# Patient Record
Sex: Female | Born: 1955
Health system: Southern US, Community
[De-identification: ages and names within clinical notes are randomized; demographics above are authoritative.]

## PROBLEM LIST (undated history)

## (undated) DIAGNOSIS — Z0000 Encounter for general adult medical examination without abnormal findings: Secondary | ICD-10-CM

## (undated) DIAGNOSIS — K219 Gastro-esophageal reflux disease without esophagitis: Secondary | ICD-10-CM

## (undated) DIAGNOSIS — G4700 Insomnia, unspecified: Principal | ICD-10-CM

## (undated) DIAGNOSIS — J309 Allergic rhinitis, unspecified: Secondary | ICD-10-CM

## (undated) DIAGNOSIS — M1990 Unspecified osteoarthritis, unspecified site: Secondary | ICD-10-CM

## (undated) DIAGNOSIS — R1013 Epigastric pain: Secondary | ICD-10-CM

## (undated) DIAGNOSIS — D649 Anemia, unspecified: Secondary | ICD-10-CM

## (undated) DIAGNOSIS — Z8632 Personal history of gestational diabetes: Secondary | ICD-10-CM

## (undated) DIAGNOSIS — S32000A Wedge compression fracture of unspecified lumbar vertebra, initial encounter for closed fracture: Secondary | ICD-10-CM

## (undated) DIAGNOSIS — F419 Anxiety disorder, unspecified: Secondary | ICD-10-CM

## (undated) DIAGNOSIS — Z5189 Encounter for other specified aftercare: Secondary | ICD-10-CM

## (undated) DIAGNOSIS — F329 Major depressive disorder, single episode, unspecified: Secondary | ICD-10-CM

## (undated) DIAGNOSIS — T7840XA Allergy, unspecified, initial encounter: Secondary | ICD-10-CM

## (undated) DIAGNOSIS — M810 Age-related osteoporosis without current pathological fracture: Secondary | ICD-10-CM

## (undated) DIAGNOSIS — Z780 Asymptomatic menopausal state: Secondary | ICD-10-CM

## (undated) DIAGNOSIS — M25519 Pain in unspecified shoulder: Secondary | ICD-10-CM

## (undated) DIAGNOSIS — C4491 Basal cell carcinoma of skin, unspecified: Secondary | ICD-10-CM

## (undated) DIAGNOSIS — C44711 Basal cell carcinoma of skin of unspecified lower limb, including hip: Secondary | ICD-10-CM

## (undated) DIAGNOSIS — D259 Leiomyoma of uterus, unspecified: Secondary | ICD-10-CM

## (undated) DIAGNOSIS — F32A Depression, unspecified: Secondary | ICD-10-CM

## (undated) DIAGNOSIS — S022XXA Fracture of nasal bones, initial encounter for closed fracture: Secondary | ICD-10-CM

## (undated) DIAGNOSIS — H04123 Dry eye syndrome of bilateral lacrimal glands: Secondary | ICD-10-CM

## (undated) HISTORY — DX: Disorder of phosphorus metabolism, unspecified: E83.30

## (undated) HISTORY — DX: Anxiety disorder, unspecified: F41.9

## (undated) HISTORY — DX: Fracture of nasal bones, initial encounter for closed fracture: S02.2XXA

## (undated) HISTORY — DX: Basal cell carcinoma of skin, unspecified: C44.91

## (undated) HISTORY — DX: Asymptomatic menopausal state: Z78.0

## (undated) HISTORY — DX: Pain in unspecified shoulder: M25.519

## (undated) HISTORY — PX: HERNIA REPAIR: SHX51

## (undated) HISTORY — PX: CHOLECYSTECTOMY: SHX55

## (undated) HISTORY — DX: Encounter for general adult medical examination without abnormal findings: Z00.00

## (undated) HISTORY — DX: Encounter for other specified aftercare: Z51.89

## (undated) HISTORY — DX: Anemia, unspecified: D64.9

## (undated) HISTORY — DX: Wedge compression fracture of unspecified lumbar vertebra, initial encounter for closed fracture: S32.000A

## (undated) HISTORY — PX: NASAL SINUS SURGERY: SHX719

## (undated) HISTORY — PX: GASTRIC BYPASS: SHX52

## (undated) HISTORY — DX: Gastro-esophageal reflux disease without esophagitis: K21.9

## (undated) HISTORY — PX: OTHER SURGICAL HISTORY: SHX169

## (undated) HISTORY — DX: Age-related osteoporosis without current pathological fracture: M81.0

## (undated) HISTORY — DX: Dry eye syndrome of bilateral lacrimal glands: H04.123

## (undated) HISTORY — DX: Basal cell carcinoma of skin of unspecified lower limb, including hip: C44.711

## (undated) HISTORY — DX: Depression, unspecified: F32.A

## (undated) HISTORY — DX: Major depressive disorder, single episode, unspecified: F32.9

## (undated) HISTORY — DX: Allergy, unspecified, initial encounter: T78.40XA

## (undated) HISTORY — PX: TUBAL LIGATION: SHX77

## (undated) HISTORY — DX: Epigastric pain: R10.13

## (undated) HISTORY — DX: Personal history of gestational diabetes: Z86.32

## (undated) HISTORY — PX: SEPTOPLASTY: SUR1290

## (undated) HISTORY — DX: Allergic rhinitis, unspecified: J30.9

## (undated) HISTORY — DX: Unspecified osteoarthritis, unspecified site: M19.90

## (undated) HISTORY — PX: COLONOSCOPY: SHX174

## (undated) HISTORY — PX: APPENDECTOMY: SHX54

## (undated) HISTORY — PX: KNEE SURGERY: SHX244

## (undated) HISTORY — DX: Insomnia, unspecified: G47.00

## (undated) HISTORY — DX: Leiomyoma of uterus, unspecified: D25.9

---

## 1999-12-15 ENCOUNTER — Other Ambulatory Visit: Admission: RE | Admit: 1999-12-15 | Discharge: 1999-12-15 | Payer: Self-pay | Admitting: Obstetrics and Gynecology

## 2000-11-12 ENCOUNTER — Encounter: Admission: RE | Admit: 2000-11-12 | Discharge: 2001-02-10 | Payer: Self-pay | Admitting: Surgical Oncology

## 2001-01-14 ENCOUNTER — Ambulatory Visit (HOSPITAL_BASED_OUTPATIENT_CLINIC_OR_DEPARTMENT_OTHER): Admission: RE | Admit: 2001-01-14 | Discharge: 2001-01-14 | Payer: Self-pay | Admitting: Internal Medicine

## 2001-02-03 ENCOUNTER — Other Ambulatory Visit: Admission: RE | Admit: 2001-02-03 | Discharge: 2001-02-03 | Payer: Self-pay | Admitting: Obstetrics and Gynecology

## 2002-02-05 ENCOUNTER — Other Ambulatory Visit: Admission: RE | Admit: 2002-02-05 | Discharge: 2002-02-05 | Payer: Self-pay | Admitting: Obstetrics and Gynecology

## 2003-04-07 ENCOUNTER — Other Ambulatory Visit: Admission: RE | Admit: 2003-04-07 | Discharge: 2003-04-07 | Payer: Self-pay | Admitting: Obstetrics and Gynecology

## 2003-12-14 ENCOUNTER — Ambulatory Visit: Payer: Self-pay | Admitting: Family Medicine

## 2004-01-28 ENCOUNTER — Ambulatory Visit: Payer: Self-pay | Admitting: Family Medicine

## 2004-06-09 ENCOUNTER — Other Ambulatory Visit: Admission: RE | Admit: 2004-06-09 | Discharge: 2004-06-09 | Payer: Self-pay | Admitting: Obstetrics and Gynecology

## 2004-08-14 ENCOUNTER — Ambulatory Visit: Payer: Self-pay | Admitting: Internal Medicine

## 2004-09-14 ENCOUNTER — Ambulatory Visit: Payer: Self-pay | Admitting: Internal Medicine

## 2004-10-12 ENCOUNTER — Ambulatory Visit: Payer: Self-pay | Admitting: Internal Medicine

## 2004-10-24 ENCOUNTER — Ambulatory Visit: Payer: Self-pay | Admitting: Family Medicine

## 2005-08-29 ENCOUNTER — Other Ambulatory Visit: Admission: RE | Admit: 2005-08-29 | Discharge: 2005-08-29 | Payer: Self-pay | Admitting: Obstetrics and Gynecology

## 2006-03-05 ENCOUNTER — Ambulatory Visit: Payer: Self-pay | Admitting: Internal Medicine

## 2006-04-12 ENCOUNTER — Ambulatory Visit: Payer: Self-pay | Admitting: Family Medicine

## 2006-05-14 ENCOUNTER — Ambulatory Visit: Payer: Self-pay | Admitting: Internal Medicine

## 2006-07-31 ENCOUNTER — Ambulatory Visit: Payer: Self-pay | Admitting: Internal Medicine

## 2006-07-31 DIAGNOSIS — J019 Acute sinusitis, unspecified: Secondary | ICD-10-CM | POA: Insufficient documentation

## 2006-08-12 ENCOUNTER — Telehealth: Payer: Self-pay | Admitting: Internal Medicine

## 2006-08-15 DIAGNOSIS — J309 Allergic rhinitis, unspecified: Secondary | ICD-10-CM | POA: Insufficient documentation

## 2006-08-15 HISTORY — DX: Allergic rhinitis, unspecified: J30.9

## 2006-08-22 ENCOUNTER — Ambulatory Visit: Payer: Self-pay | Admitting: Internal Medicine

## 2006-08-22 DIAGNOSIS — M169 Osteoarthritis of hip, unspecified: Secondary | ICD-10-CM

## 2006-08-22 DIAGNOSIS — M1610 Unilateral primary osteoarthritis, unspecified hip: Secondary | ICD-10-CM | POA: Insufficient documentation

## 2006-09-11 ENCOUNTER — Other Ambulatory Visit: Admission: RE | Admit: 2006-09-11 | Discharge: 2006-09-11 | Payer: Self-pay | Admitting: Obstetrics and Gynecology

## 2006-10-08 ENCOUNTER — Ambulatory Visit: Payer: Self-pay | Admitting: Internal Medicine

## 2006-10-08 LAB — CONVERTED CEMR LAB
ALT: 15 units/L (ref 0–35)
Blood in Urine, dipstick: NEGATIVE
CO2: 28 meq/L (ref 19–32)
Chloride: 108 meq/L (ref 96–112)
Cholesterol: 124 mg/dL (ref 0–200)
Eosinophils Absolute: 0.1 10*3/uL (ref 0.0–0.6)
GFR calc Af Amer: 98 mL/min
GFR calc non Af Amer: 81 mL/min
HDL: 50.7 mg/dL (ref >39.0)
Hemoglobin: 10.3 g/dL — ABNORMAL LOW (ref 12.0–15.0)
LDL Cholesterol: 49 mg/dL (ref 0–99)
MCHC: 32.2 g/dL (ref 30.0–36.0)
Monocytes Relative: 5.6 % (ref 3.0–11.0)
Neutrophils Relative %: 55 % (ref 43.0–77.0)
Platelets: 350 10*3/uL (ref 150–400)
Potassium: 4.9 meq/L (ref 3.5–5.1)
RBC: 4 M/uL (ref 3.87–5.11)
RDW: 15.2 % — ABNORMAL HIGH (ref 11.5–14.6)
Sodium: 142 meq/L (ref 135–145)
Specific Gravity, Urine: 1.02
TSH: 1.08 microintl units/mL (ref 0.35–5.50)
Triglycerides: 122 mg/dL (ref 0–149)
Urobilinogen, UA: 1
VLDL: 24 mg/dL (ref 0–40)
WBC Urine, dipstick: NEGATIVE

## 2006-10-24 ENCOUNTER — Ambulatory Visit: Payer: Self-pay | Admitting: Internal Medicine

## 2006-10-24 DIAGNOSIS — D649 Anemia, unspecified: Secondary | ICD-10-CM | POA: Insufficient documentation

## 2006-10-24 DIAGNOSIS — M1990 Unspecified osteoarthritis, unspecified site: Secondary | ICD-10-CM

## 2006-10-24 DIAGNOSIS — F3289 Other specified depressive episodes: Secondary | ICD-10-CM

## 2006-10-24 DIAGNOSIS — F329 Major depressive disorder, single episode, unspecified: Secondary | ICD-10-CM

## 2006-10-24 DIAGNOSIS — F418 Other specified anxiety disorders: Secondary | ICD-10-CM | POA: Insufficient documentation

## 2006-10-24 HISTORY — DX: Major depressive disorder, single episode, unspecified: F32.9

## 2006-10-24 HISTORY — DX: Anemia, unspecified: D64.9

## 2006-10-24 HISTORY — DX: Unspecified osteoarthritis, unspecified site: M19.90

## 2006-10-24 HISTORY — DX: Other specified depressive episodes: F32.89

## 2006-12-03 ENCOUNTER — Ambulatory Visit: Payer: Self-pay | Admitting: Internal Medicine

## 2006-12-03 DIAGNOSIS — D509 Iron deficiency anemia, unspecified: Secondary | ICD-10-CM | POA: Insufficient documentation

## 2006-12-03 LAB — CONVERTED CEMR LAB
Basophils Absolute: 0.1 10*3/uL (ref 0.0–0.1)
Basophils Relative: 0.6 % (ref 0.0–1.0)
Eosinophils Absolute: 0.2 10*3/uL (ref 0.0–0.6)
Eosinophils Relative: 1.8 % (ref 0.0–5.0)
Iron: 19 ug/dL — ABNORMAL LOW (ref 42–145)
MCHC: 32.2 g/dL (ref 30.0–36.0)
MCV: 79.7 fL (ref 78.0–100.0)
Monocytes Absolute: 0.5 10*3/uL (ref 0.2–0.7)
Neutrophils Relative %: 57.6 % (ref 43.0–77.0)
Platelets: 352 10*3/uL (ref 150–400)
RBC: 4.27 M/uL (ref 3.87–5.11)

## 2007-01-02 HISTORY — PX: ABDOMINAL HYSTERECTOMY: SHX81

## 2007-02-22 ENCOUNTER — Inpatient Hospital Stay (HOSPITAL_COMMUNITY): Admission: AD | Admit: 2007-02-22 | Discharge: 2007-02-23 | Payer: Self-pay | Admitting: Gynecology

## 2007-02-25 ENCOUNTER — Encounter: Payer: Self-pay | Admitting: Obstetrics and Gynecology

## 2007-02-25 ENCOUNTER — Ambulatory Visit (HOSPITAL_COMMUNITY): Admission: RE | Admit: 2007-02-25 | Discharge: 2007-02-26 | Payer: Self-pay | Admitting: Obstetrics and Gynecology

## 2007-04-09 ENCOUNTER — Telehealth: Payer: Self-pay | Admitting: Internal Medicine

## 2007-05-22 ENCOUNTER — Telehealth: Payer: Self-pay | Admitting: Internal Medicine

## 2007-05-30 ENCOUNTER — Ambulatory Visit: Payer: Self-pay | Admitting: Internal Medicine

## 2007-05-30 LAB — CONVERTED CEMR LAB
Basophils Relative: 1 % (ref 0–1)
Eosinophils Absolute: 0.2 10*3/uL (ref 0.0–0.7)
HCT: 34.8 % — ABNORMAL LOW (ref 36.0–46.0)
Hemoglobin: 10.7 g/dL — ABNORMAL LOW (ref 12.0–15.0)
Lymphs Abs: 3.4 10*3/uL (ref 0.7–4.0)
MCV: 81.3 fL (ref 78.0–100.0)
Monocytes Absolute: 0.6 10*3/uL (ref 0.1–1.0)
Neutrophils Relative %: 53 % (ref 43–77)
Platelets: 310 10*3/uL (ref 150–400)
RBC: 4.28 M/uL (ref 3.87–5.11)
WBC: 9 10*3/uL (ref 4.0–10.5)

## 2007-06-18 ENCOUNTER — Telehealth: Payer: Self-pay | Admitting: Internal Medicine

## 2007-06-19 ENCOUNTER — Telehealth: Payer: Self-pay | Admitting: *Deleted

## 2007-07-01 ENCOUNTER — Telehealth: Payer: Self-pay | Admitting: Internal Medicine

## 2007-09-29 ENCOUNTER — Ambulatory Visit: Payer: Self-pay | Admitting: Internal Medicine

## 2007-09-29 ENCOUNTER — Telehealth: Payer: Self-pay | Admitting: Internal Medicine

## 2007-09-29 DIAGNOSIS — IMO0001 Reserved for inherently not codable concepts without codable children: Secondary | ICD-10-CM | POA: Insufficient documentation

## 2007-09-29 DIAGNOSIS — J0110 Acute frontal sinusitis, unspecified: Secondary | ICD-10-CM | POA: Insufficient documentation

## 2007-09-29 LAB — CONVERTED CEMR LAB
Basophils Absolute: 0 10*3/uL (ref 0.0–0.1)
Eosinophils Relative: 1.3 % (ref 0.0–5.0)
HCT: 31.8 % — ABNORMAL LOW (ref 36.0–46.0)
MCHC: 33.1 g/dL (ref 30.0–36.0)
MCV: 84.8 fL (ref 78.0–100.0)
Monocytes Absolute: 0.5 10*3/uL (ref 0.1–1.0)
Neutrophils Relative %: 57.6 % (ref 43.0–77.0)

## 2007-10-10 ENCOUNTER — Ambulatory Visit: Payer: Self-pay | Admitting: Internal Medicine

## 2007-10-10 DIAGNOSIS — M25519 Pain in unspecified shoulder: Secondary | ICD-10-CM

## 2007-10-10 HISTORY — DX: Pain in unspecified shoulder: M25.519

## 2007-10-22 ENCOUNTER — Ambulatory Visit: Payer: Self-pay | Admitting: Internal Medicine

## 2007-10-22 ENCOUNTER — Telehealth: Payer: Self-pay | Admitting: Internal Medicine

## 2007-10-22 DIAGNOSIS — S022XXA Fracture of nasal bones, initial encounter for closed fracture: Secondary | ICD-10-CM

## 2007-10-22 HISTORY — DX: Fracture of nasal bones, initial encounter for closed fracture: S02.2XXA

## 2007-10-26 ENCOUNTER — Emergency Department (HOSPITAL_BASED_OUTPATIENT_CLINIC_OR_DEPARTMENT_OTHER): Admission: EM | Admit: 2007-10-26 | Discharge: 2007-10-26 | Payer: Self-pay | Admitting: Emergency Medicine

## 2008-02-18 ENCOUNTER — Telehealth: Payer: Self-pay | Admitting: Internal Medicine

## 2008-02-25 ENCOUNTER — Telehealth: Payer: Self-pay | Admitting: *Deleted

## 2008-06-14 ENCOUNTER — Ambulatory Visit: Payer: Self-pay | Admitting: Internal Medicine

## 2008-06-16 ENCOUNTER — Ambulatory Visit: Payer: Self-pay | Admitting: Internal Medicine

## 2008-06-16 DIAGNOSIS — M7710 Lateral epicondylitis, unspecified elbow: Secondary | ICD-10-CM | POA: Insufficient documentation

## 2008-07-06 ENCOUNTER — Telehealth (INDEPENDENT_AMBULATORY_CARE_PROVIDER_SITE_OTHER): Payer: Self-pay | Admitting: *Deleted

## 2008-10-08 ENCOUNTER — Telehealth (INDEPENDENT_AMBULATORY_CARE_PROVIDER_SITE_OTHER): Payer: Self-pay | Admitting: *Deleted

## 2008-12-07 ENCOUNTER — Telehealth: Payer: Self-pay | Admitting: Internal Medicine

## 2008-12-20 ENCOUNTER — Telehealth: Payer: Self-pay | Admitting: Internal Medicine

## 2009-06-13 ENCOUNTER — Ambulatory Visit: Payer: Self-pay | Admitting: Internal Medicine

## 2009-06-13 ENCOUNTER — Telehealth: Payer: Self-pay | Admitting: Internal Medicine

## 2009-12-09 ENCOUNTER — Telehealth: Payer: Self-pay | Admitting: Internal Medicine

## 2010-02-02 NOTE — Progress Notes (Signed)
Summary: sinus infection?  Phone Note Call from Patient   Caller: Patient Call For: Stacie Glaze MD Reason for Call: Acute Illness Summary of Call: Pt is c/o headache and sinus congestion and post nasal drainage.  no fever.  Taking Day quil. and askng for RX. [facility] [phone] Initial call taken by: [facility] CMA AAMA,  December 09, 2009 2:03 PM  Follow-up for Phone Call        per dr Lovell Sheehan- may have levaquin 500 two times a day for 10 days and lodraine 1 two times a day for 10 days( just need to refill( Follow-up by: Willy Eddy, LPN,  December 09, 2009 2:15 PM    New/Updated Medications: LEVAQUIN 500 MG TABS (LEVOFLOXACIN) one by mouth daily x 10 days Prescriptions: LEVAQUIN 500 MG TABS (LEVOFLOXACIN) one by mouth daily x 10 days  #10 x 0   Entered by:   Lynann Beaver CMA AAMA   Authorized by:   Stacie Glaze MD   Signed by:   Lynann Beaver CMA AAMA on 12/09/2009   Method used:   [id number]       [address] [place], [place]  [address]       Ph: [phone] or [phone]       Fax: [phone]   RxID:   [id number]  Notified pt.

## 2010-02-02 NOTE — Progress Notes (Signed)
Summary: sinus infection  Phone Note Call from Patient   Caller: Patient Call For: Stacie Glaze MD Summary of Call: Pt is asking for a prescription of an ongoing sinus infection she has had for weeks.  Sinus congestion, and headache. [facility] [phone] Initial call taken by: Lynann Beaver CMA,  June 13, 2009 9:40 AM  Follow-up for Phone Call        ov given Follow-up by: Willy Eddy, LPN,  June 13, 2009 11:52 AM

## 2010-02-02 NOTE — Assessment & Plan Note (Signed)
Summary: sinusitis/bmw   Vital Signs:  Patient profile:   55 year old female Height:      65 inches Temp:     98.2 degrees F oral Pulse rate:   72 / minute Resp:     14 per minute BP sitting:   130 / 80  (left arm)  Vitals Entered By: Willy Eddy, LPN (June 13, 2009 1:23 PM) CC: c/o sinusitis like sx for several weeks, URI symptoms   CC:  c/o sinusitis like sx for several weeks and URI symptoms.  History of Present Illness:  URI Symptoms      This is a 55 year old woman who presents with URI symptoms.  several weeks of conjestions thta waxed and wanes that she initial though was allergies.  The patient reports nasal congestion, sore throat, and earache, but denies clear nasal discharge, purulent nasal discharge, dry cough, productive cough, and sick contacts.  The patient denies fever, low-grade fever (<100.5 degrees), fever of 100.5-103 degrees, fever of 103.1-104 degrees, fever to >104 degrees, stiff neck, dyspnea, wheezing, rash, vomiting, diarrhea, use of an antipyretic, and response to antipyretic.  The patient also reports itchy watery eyes, itchy throat, headache, muscle aches, and severe fatigue.  The patient denies the following risk factors for Strep sinusitis: unilateral facial pain, unilateral nasal discharge, poor response to decongestant, double sickening, tooth pain, Strep exposure, tender adenopathy, and absence of cough.    Preventive Screening-Counseling & Management  Alcohol-Tobacco     Smoking Status: never     Passive Smoke Exposure: no  Problems Prior to Update: 1)  Lateral Epicondylitis  (ICD-726.32) 2)  Fracture, Nose  (ICD-802.0) 3)  Shoulder Pain, Bilateral  (ICD-719.41) 4)  Myofascial Pain Syndrome  (ICD-729.1) 5)  Acute Frontal Sinusitis  (ICD-461.1) 6)  Anemia-iron Deficiency  (ICD-280.9) 7)  Preventive Health Care  (ICD-V70.0) 8)  Family History of Cad Female 1st Degree Relative <50  (ICD-V17.3) 9)  Osteoarthritis  (ICD-715.90) 10)  Depression   (ICD-311) 11)  Anemia-nos  (ICD-285.9) 12)  Osteoarthrosis, Local Nos, Pelvis/thigh  (ICD-715.35) 13)  Allergic Rhinitis  (ICD-477.9) 14)  Sinusitis  (ICD-473.9)  Current Problems (verified): 1)  Lateral Epicondylitis  (ICD-726.32) 2)  Fracture, Nose  (ICD-802.0) 3)  Shoulder Pain, Bilateral  (ICD-719.41) 4)  Myofascial Pain Syndrome  (ICD-729.1) 5)  Acute Frontal Sinusitis  (ICD-461.1) 6)  Anemia-iron Deficiency  (ICD-280.9) 7)  Preventive Health Care  (ICD-V70.0) 8)  Family History of Cad Female 1st Degree Relative <50  (ICD-V17.3) 9)  Osteoarthritis  (ICD-715.90) 10)  Depression  (ICD-311) 11)  Anemia-nos  (ICD-285.9) 12)  Osteoarthrosis, Local Nos, Pelvis/thigh  (ICD-715.35) 13)  Allergic Rhinitis  (ICD-477.9) 14)  Sinusitis  (ICD-473.9)  Medications Prior to Update: 1)  Zoloft 100 Mg  Tabs (Sertraline Hcl) .... One By Mouth Daily 2)  Vitamin B-12 Cr 1000 Mcg  Tbcr (Cyanocobalamin) .... One Q Day 3)  Ambien 10 Mg  Tabs (Zolpidem Tartrate) .... Q Hs As Needed 4)  Vitamin D 2500 .Marland Kitchen.. 1 Once Daily' 5)  Claritin-D 24 Hour 10-240 Mg Xr24h-Tab (Loratadine-Pseudoephedrine) .Marland Kitchen.. 1 Once Daily 6)  Ceftin 250 Mg Tabs (Cefuroxime Axetil) .... One By Mouth Two Times A Day 7)  Nasonex 50 Mcg/act Susp (Mometasone Furoate) .... Two Sprays Q Nare Daily 8)  Zithromax Z-Pak 250 Mg Tabs (Azithromycin) .... Use As Directed 9)  Robitussin Cough/cold Cf 5-10-100 Mg/53ml Liqd (Phenylephrine-Dm-Gg) .... As Directed  Current Medications (verified): 1)  Zoloft 100 Mg  Tabs (Sertraline Hcl) .Marland KitchenMarland KitchenMarland Kitchen  One By Mouth Daily 2)  Vitamin B-12 Cr 1000 Mcg  Tbcr (Cyanocobalamin) .... One Q Day 3)  Ambien 10 Mg  Tabs (Zolpidem Tartrate) .... Q Hs As Needed 4)  Vitamin D 2500 .Marland Kitchen.. 1 Once Daily' 5)  Claritin-D 24 Hour 10-240 Mg Xr24h-Tab (Loratadine-Pseudoephedrine) .Marland Kitchen.. 1 Once Daily  Allergies (verified): 1)  ! Sporanox (Itraconazole)  Past History:  Family History: Last updated: 12/03/2006 mother  healthy father  Family History of CAD Female 1st degree relative <50 Family History of Arthritis Family History Hypertension  Social History: Last updated: 12/03/2006 Married Never Smoked  Risk Factors: Caffeine Use: 1 (12/03/2006) Exercise: yes (10/24/2006)  Risk Factors: Smoking Status: never (06/13/2009) Passive Smoke Exposure: no (06/13/2009)  Past medical, surgical, family and social histories (including risk factors) reviewed for relevance to current acute and chronic problems.  Past Medical History: Reviewed history from 12/03/2006 and no changes required. Anemia-iron deficiency Allergic rhinitis Depression  Past Surgical History: Reviewed history from 10/24/2006 and no changes required. hx of septoplasty-pt's report Gastric bypass Tubal ligation Sinus surgery Caesarean section x 3 Appendectomy Cholecystectomy ventral herniorrhaphy  Family History: Reviewed history from 12/03/2006 and no changes required. mother healthy father  Family History of CAD Female 1st degree relative <50 Family History of Arthritis Family History Hypertension  Social History: Reviewed history from 12/03/2006 and no changes required. Married Never Smoked  Review of Systems       The patient complains of hoarseness, dyspnea on exertion, and peripheral edema.  The patient denies anorexia, fever, weight loss, weight gain, vision loss, decreased hearing, chest pain, syncope, prolonged cough, headaches, hemoptysis, abdominal pain, melena, hematochezia, hematuria, incontinence, genital sores, muscle weakness, suspicious skin lesions, transient blindness, difficulty walking, depression, unusual weight change, abnormal bleeding, enlarged lymph nodes, angioedema, and breast masses.    Physical Exam  General:  alert and well-developed.   Head:  Normocephalic and atraumatic without obvious abnormalities. No apparent alopecia or balding. Ears:  R ear normal and L ear normal.   Nose:  no  external deformity, mucosal erythema, mucosal edema, and airflow obstruction.   Mouth:  posterior lymphoid hypertrophy and postnasal drip.   Neck:  No deformities, masses, or tenderness noted. Lungs:  Normal respiratory effort, chest expands symmetrically. Lungs are clear to auscultation, no crackles or wheezes. Heart:  Normal rate and regular rhythm. S1 and S2 normal without gallop, murmur, click, rub or other extra sounds. Abdomen:  soft, normal bowel sounds, and no distention.     Impression & Recommendations:  Problem # 1:  ACUTE FRONTAL SINUSITIS (ICD-461.1) acute symptoms last infection was dec 2010 The following medications were removed from the medication list:    Ceftin 250 Mg Tabs (Cefuroxime axetil) ..... One by mouth two times a day    Nasonex 50 Mcg/act Susp (Mometasone furoate) .Marland Kitchen..Marland Kitchen Two sprays q nare daily    Zithromax Z-pak 250 Mg Tabs (Azithromycin) ..... Use as directed    Robitussin Cough/cold Cf 5-10-100 Mg/38ml Liqd (Phenylephrine-dm-gg) .Marland Kitchen... As directed Her updated medication list for this problem includes:    Claritin-d 24 Hour 10-240 Mg Xr24h-tab (Loratadine-pseudoephedrine) .Marland Kitchen... 1 once daily    Levaquin 500 Mg Tabs (Levofloxacin) ..... One by mouth two times a day for 10 days    Lodrane 12d 6-45 Mg Xr12h-tab (Brompheniramine-pseudoeph) ..... One by mouth two times a day  Instructed on treatment. Call if symptoms persist or worsen.   Problem # 2:  SINUSITIS (ICD-473.9)  chronic allergic The following medications were removed from the medication  list:    Ceftin 250 Mg Tabs (Cefuroxime axetil) ..... One by mouth two times a day    Nasonex 50 Mcg/act Susp (Mometasone furoate) .Marland Kitchen..Marland Kitchen Two sprays q nare daily    Zithromax Z-pak 250 Mg Tabs (Azithromycin) ..... Use as directed    Robitussin Cough/cold Cf 5-10-100 Mg/60ml Liqd (Phenylephrine-dm-gg) .Marland Kitchen... As directed Her updated medication list for this problem includes:    Claritin-d 24 Hour 10-240 Mg Xr24h-tab  (Loratadine-pseudoephedrine) .Marland Kitchen... 1 once daily    Levaquin 500 Mg Tabs (Levofloxacin) ..... One by mouth two times a day for 10 days    Lodrane 12d 6-45 Mg Xr12h-tab (Brompheniramine-pseudoeph) ..... One by mouth two times a day  Take antibiotics for full duration. Discussed treatment options including indications for coronal CT scan of sinuses and ENT referral.   Complete Medication List: 1)  Zoloft 100 Mg Tabs (Sertraline hcl) .... One by mouth daily 2)  Vitamin B-12 Cr 1000 Mcg Tbcr (Cyanocobalamin) .... One q day 3)  Ambien 10 Mg Tabs (Zolpidem tartrate) .... Q hs as needed 4)  Vitamin D 2500  .Marland Kitchen.. 1 once daily' 5)  Claritin-d 24 Hour 10-240 Mg Xr24h-tab (Loratadine-pseudoephedrine) .Marland Kitchen.. 1 once daily 6)  Levaquin 500 Mg Tabs (Levofloxacin) .... One by mouth two times a day for 10 days 7)  Lodrane 12d 6-45 Mg Xr12h-tab (Brompheniramine-pseudoeph) .... One by mouth two times a day  Patient Instructions: 1)  Take your antibiotic as prescribed until ALL of it is gone, but stop if you develop a rash or swelling and contact our office as soon as possible. Prescriptions: LODRANE 12D 6-45 MG XR12H-TAB (BROMPHENIRAMINE-PSEUDOEPH) one by mouth two times a day  #20 x 1   Entered and Authorized by:   Stacie Glaze MD   Signed by:   Stacie Glaze MD on 06/13/2009   Method used:   [facility]        [facility]  [address] [phone][facility]       [address]       [place], [place]  [address]       Ph: [phone] or [phone]       Fax: [phone]   RxID:   [id number] LEVAQUIN 500 MG TABS (LEVOFLOXACIN) one by mouth two times a day for 10 days  #20 x 0   Entered and Authorized by:   Stacie Glaze MD   Signed by:   Stacie Glaze MD on 06/13/2009   Method used:   [facility]        [facility]  [address] [phone][facility] ([facility]ail)       [address] [place], [place]  [address]       Ph: [phone] or [phone]       Fax: [phone]   RxID:    [id number]   Appended Document: sinusitis/bmw levaquin changed to once daily

## 2010-02-14 ENCOUNTER — Telehealth: Payer: Self-pay | Admitting: Family Medicine

## 2010-02-15 ENCOUNTER — Encounter: Payer: Self-pay | Admitting: Family Medicine

## 2010-02-15 ENCOUNTER — Encounter (INDEPENDENT_AMBULATORY_CARE_PROVIDER_SITE_OTHER): Payer: Self-pay | Admitting: *Deleted

## 2010-02-15 ENCOUNTER — Ambulatory Visit (INDEPENDENT_AMBULATORY_CARE_PROVIDER_SITE_OTHER): Payer: Self-pay | Admitting: Family Medicine

## 2010-02-15 DIAGNOSIS — D509 Iron deficiency anemia, unspecified: Secondary | ICD-10-CM

## 2010-02-15 DIAGNOSIS — C44711 Basal cell carcinoma of skin of unspecified lower limb, including hip: Secondary | ICD-10-CM | POA: Insufficient documentation

## 2010-02-15 DIAGNOSIS — D259 Leiomyoma of uterus, unspecified: Secondary | ICD-10-CM

## 2010-02-15 DIAGNOSIS — F3289 Other specified depressive episodes: Secondary | ICD-10-CM

## 2010-02-15 DIAGNOSIS — F329 Major depressive disorder, single episode, unspecified: Secondary | ICD-10-CM

## 2010-02-15 DIAGNOSIS — J0110 Acute frontal sinusitis, unspecified: Secondary | ICD-10-CM

## 2010-02-15 HISTORY — DX: Basal cell carcinoma of skin of unspecified lower limb, including hip: C44.711

## 2010-02-15 HISTORY — DX: Leiomyoma of uterus, unspecified: D25.9

## 2010-02-22 ENCOUNTER — Encounter: Payer: Self-pay | Admitting: Family Medicine

## 2010-02-22 NOTE — Letter (Signed)
Summary: Pre Visit Letter Revised  [facility]  [address] [place], [place] [address]   Phone: [phone]  Fax: [phone]        02/15/2010 MRN: [id number] [facility] [address] [place], [place]  [address]  [place]             Procedure Date:  03/22/2010 @ 1:30   direct colon-Dt. Jarold Motto   Welcome to the Gastroenterology Division at [facility].    You are scheduled to see a nurse for your pre-procedure visit on 03/08/2010 at 10:00 on the 3rd floor at [facility], [address]  We ask that you try to arrive at our office 15 minutes prior to your appointment time to allow for check-in.  Please take a minute to review the attached form.  If you answer "Yes" to one or more of the questions on the first page, we ask that you call the person listed at your earliest opportunity.  If you answer "No" to all of the questions, please complete the rest of the form and bring it to your appointment.    Your nurse visit will consist of discussing your medical and surgical history, your immediate family medical history, and your medications.   If you are unable to list all of your medications on the form, please bring the medication bottles to your appointment and we will list them.  We will need to be aware of both prescribed and over the counter drugs.  We will need to know exact dosage information as well.    Please be prepared to read and sign documents such as consent forms, a financial agreement, and acknowledgement forms.  If necessary, and with your consent, a friend or relative is welcome to sit-in on the nurse visit with you.  Please bring your insurance card so that we may make a copy of it.  If your insurance requires a referral to see a specialist, please bring your referral form from your primary care physician.  No co-pay is required for this nurse visit.     If you cannot keep your appointment, please call [phone] to cancel or  reschedule prior to your appointment date.  This allows [place] the opportunity to schedule an appointment for another patient in need of care.    Thank you for choosing [facility] for your medical needs.  We appreciate the opportunity to care for you.  Please visit [place] at our website  to learn more about our practice.  Sincerely, The Gastroenterology Division

## 2010-02-22 NOTE — Assessment & Plan Note (Signed)
Summary: sinus infection and to establish with Dr. Abner Greenspan   Vital Signs:  Patient profile:   55 year old female Height:      65 inches (165.10 cm) Weight:      148.75 pounds (67.61 kg) BMI:     24.84 O2 Sat:      97 % on Room air Temp:     98.1 degrees F (36.72 degrees C) oral Pulse rate:   89 / minute BP sitting:   123 / 86  (right arm)  Vitals Entered By: Josph Macho RMA (February 15, 2010 9:22 AM)  O2 Flow:  Room air CC: Establish new pt/ possible Sinus infection - head congestion eyes, ears, throat- when blowing nose its green/bloody X 1 weekCF Is Patient Diabetic? No   History of Present Illness: Patient is a 55 year old Caucasian female who is here today for new patient appt. She is seen for acute onset of worsening nasal congestion, fevers, chills, greenrhinorrhea, facial pressure. Monitor he is occasionally blood-tinged. She's had some general malaise mild anorexia possible low-grade fevers. She denies any chest pain, palpitations, shortness of breath, ear pain, sore throat. She has not had her mammogram last year proceed with colonoscopy or bone densitometry since turning 51 or done any preventative health maintenance such as lab work her Pap smear is due to a very stressful one to 2 years. 2011 calculus her father after a long battle with hypertension and bradycardia kidney failure her mother said surgeries, other family members have been ill and she has 3 teenage daughters. As a result she has put her own health on hold she did undergo a hysterectomy in 2009 for painful, heavy menstrual bleeding due to uterine fibroids.   Preventive Screening-Counseling & Management  Caffeine-Diet-Exercise     Does Patient Exercise: no      Drug Use:  no.    Current Problems (verified): 1)  Basal Cell Carcinoma Skin Lower Limb Incl Hip  (ICD-173.71) 2)  Leiomyoma of Uterus, Unspecified  (ICD-218.9) 3)  Lateral Epicondylitis  (ICD-726.32) 4)  Fracture, Nose  (ICD-802.0) 5)  Shoulder  Pain, Bilateral  (ICD-719.41) 6)  Myofascial Pain Syndrome  (ICD-729.1) 7)  Acute Frontal Sinusitis  (ICD-461.1) 8)  Anemia-iron Deficiency  (ICD-280.9) 9)  Preventive Health Care  (ICD-V70.0) 10)  Family History of Cad Female 1st Degree Relative <50  (ICD-V17.3) 11)  Osteoarthritis  (ICD-715.90) 12)  Depression  (ICD-311) 13)  Anemia-nos  (ICD-285.9) 14)  Osteoarthrosis, Local Nos, Pelvis/thigh  (ICD-715.35) 15)  Allergic Rhinitis  (ICD-477.9) 16)  Sinusitis  (ICD-473.9)  Current Medications (verified): 1)  Zoloft 100 Mg  Tabs (Sertraline Hcl) .... One By Mouth Daily 2)  Vitamin B-12 Cr 1000 Mcg  Tbcr (Cyanocobalamin) .... One Q Day 3)  Ambien 10 Mg  Tabs (Zolpidem Tartrate) .... Q Hs As Needed 4)  Vitamin D 2500 .Marland Kitchen.. 1 Once Daily' 5)  Claritin-D 24 Hour 10-240 Mg Xr24h-Tab (Loratadine-Pseudoephedrine) .Marland Kitchen.. 1 Once Daily  Allergies (verified): 1)  ! Sporanox (Itraconazole)  Past History:  Past Surgical History: hx of septoplasty-pt's report Gastric bypass Tubal ligation Sinus surgery Caesarean section x 3 Appendectomy Cholecystectomy ventral herniorrhaphy x 2 Hysterectomy, partial, ovaries left in place 2009, for painful, heavy menstrual bleeding secondary to anemia and fibroids skin biopsy, L hip BCC, facial lesions were benign Lumbar compression fracture, nonsurgical  Family History: Father: deceased@76 , renal failure, HTN, CAD s/p bypass, Aortic Aneurysm, h/o smoker, hyperlipidemia Mother: 41, cholelithiasis, urinary incontince, bladder prolapse, laryngeal carcinoma, h/o smoking, allergies Siblings:  Sister: Galen Daft, 93,  Sister: 67, Amy Brother: Rosanne Ashing, Spina Bifida with shunt, 97, seizure disorder, self cath MGM: deceased in 65s, colon cancer, smoker MGF: deceased in 28s, lung cancer, previous cancer PGM: deceased in 60s, breast cancer PGF: deceased in late 21s, heart disease, stroke Children: Daughter: 16, ADHD Daughter: 52, ADHD Daughter: 65, [employer]  Social  History: Married Never Smoked Occupation: Arts development officer, previously Patent examiner Drug use-no Regular exercise-no wears a seat belt No dietary restrictions Alcohol use-no Occupation:  employed Does Patient Exercise:  no  Review of Systems       The patient complains of suspicious skin lesions.  The patient denies anorexia, fever, weight loss, weight gain, vision loss, decreased hearing, hoarseness, chest pain, syncope, dyspnea on exertion, peripheral edema, prolonged cough, headaches, hemoptysis, abdominal pain, melena, hematochezia, severe indigestion/heartburn, hematuria, incontinence, genital sores, muscle weakness, transient blindness, difficulty walking, depression, unusual weight change, abnormal bleeding, enlarged lymph nodes, angioedema, breast masses, and testicular masses.         Had her last pap in 2010, which was normal, no h/o abnormals. Last MGM in 2010 which was normal no history of abnormals  Physical Exam  General:  Well-developed,well-nourished,in no acute distress; alert,appropriate and cooperative throughout examination Head:  Normocephalic and atraumatic without obvious abnormalities. No apparent alopecia or balding. Eyes:  No corneal or conjunctival inflammation noted. EOMI. Perrla. Ears:  External ear exam shows no significant lesions or deformities.  Otoscopic examination reveals clear canals, tympanic membranes are intact bilaterally without bulging, retraction, inflammation or discharge. Hearing is grossly normal bilaterally. Nose:  External nasal examination shows no deformity or inflammation.mucosal erythema and mucosal edema.   Mouth:  Oral mucosa and oropharynx without lesions or exudates.  Teeth in good repair. Neck:  No deformities, masses, or tenderness noted. Lungs:  Normal respiratory effort, chest expands symmetrically. Lungs are clear to auscultation, no crackles or wheezes. Heart:  Normal rate and regular rhythm. S1 and S2 normal without gallop,  murmur, click, rub or other extra sounds. Abdomen:  Bowel sounds positive,abdomen soft and non-tender without masses, organomegaly or hernias noted. Msk:  No deformity or scoliosis noted of thoracic or lumbar spine.   Pulses:  R and L carotid,dorsalis pedis and posterior tibial pulses are full and equal bilaterally Extremities:  No clubbing, cyanosis, edema, or deformity noted with normal full range of motion of all joints.   Neurologic:  No cranial nerve deficits noted. Station and gait are normal. Plantar reflexes are down-going bilaterally. DTRs are symmetrical throughout. Sensory, motor and coordinative functions appear intact. Skin:  1x2cm raised, scaly lesion with an erythematous base and small spot of scabing centrally Cervical Nodes:  No lymphadenopathy noted Psych:  Cognition and judgment appear intact. Alert and cooperative with normal attention span and concentration. No apparent delusions, illusions, hallucinations   Impression & Recommendations:  Problem # 1:  BASAL CELL CARCINOMA SKIN LOWER LIMB INCL HIP (ICD-173.71) No recent concerning lesions but given her history she has been encouraged to set up annual screening exams with her Dermatologist, she has not been seen for about 2 years  Problem # 2:  ACUTE FRONTAL SINUSITIS (ICD-461.1)  The following medications were removed from the medication list:    Lodrane 12d 6-45 Mg Xr12h-tab (Brompheniramine-pseudoeph) ..... One by mouth two times a day    Levaquin 500 Mg Tabs (Levofloxacin) ..... One by mouth daily x 10 days Her updated medication list for this problem includes:    Claritin-d 24 Hour 10-240 Mg Xr24h-tab (Loratadine-pseudoephedrine) .Marland KitchenMarland KitchenMarland KitchenMarland Kitchen  1 once daily    Levaquin 500 Mg Tabs (Levofloxacin) .Marland Kitchen... 1 tab by mouth daily x 14 d    Fluticasone Propionate 50 Mcg/act Susp (Fluticasone propionate) .Marland Kitchen... 2 sprays each nostril daily as needed allergies Recurrent with some intermittent allergies will treat and she will call if  symptoms do not improve.  Problem # 3:  ANEMIA-IRON DEFICIENCY (ICD-280.9)  Her updated medication list for this problem includes:    Vitamin B-12 Cr 1000 Mcg Tbcr (Cyanocobalamin) ..... One q day Will check a CBC with next visit, likely improved s/p her hysterectomy and no longer struggling with mense  Problem # 4:  DEPRESSION (ICD-311)  Her updated medication list for this problem includes:    Zoloft 100 Mg Tabs (Sertraline hcl) .Marland Kitchen... 1/2 tab by mouth once daily x 7 days then stop Given samples of vibryd will take 10mg  by mouth once daily x 7 d then increase to 20mg  by mouth once daily x 7 days and then increase to 40mg  by mouth once daily, call if any concerning symptoms or concerns. Reevaluate at next months visit or as needed  Complete Medication List: 1)  Zoloft 100 Mg Tabs (Sertraline hcl) .... 1/2 tab by mouth once daily x 7 days then stop 2)  Vitamin B-12 Cr 1000 Mcg Tbcr (Cyanocobalamin) .... One q day 3)  Ambien 10 Mg Tabs (Zolpidem tartrate) .... Q hs as needed 4)  Vitamin D 2500  .Marland Kitchen.. 1 once daily' 5)  Claritin-d 24 Hour 10-240 Mg Xr24h-tab (Loratadine-pseudoephedrine) .Marland Kitchen.. 1 once daily 6)  Vibryd 40mg  Tab  .Marland Kitchen.. 1 tab by mouth daily 7)  Levaquin 500 Mg Tabs (Levofloxacin) .Marland Kitchen.. 1 tab by mouth daily x 14 d 8)  Fluticasone Propionate 50 Mcg/act Susp (Fluticasone propionate) .... 2 sprays each nostril daily as needed allergies  Other Orders: Gastroenterology Referral (GI)  Patient Instructions: 1)  Please schedule a follow-up appointment in 1 month or sooner as needed 2)  Start a fish oil or flaxseed oil cap daily 3)  Call and schedule your MGM 4)  We will set up your colonoscopy and you will get a phone call 5)  Call if any concerns Prescriptions: FLUTICASONE PROPIONATE 50 MCG/ACT SUSP (FLUTICASONE PROPIONATE) 2 sprays each nostril daily as needed allergies  #1 x 5   Entered and Authorized by:   Danise Edge MD   Signed by:   Danise Edge MD on 02/15/2010   Method used:    [facility] to        [facility] [facility] [phone]* (retail)       [address] [place], [place]  [address]       Ph: [phone] or [phone]       Fax: [phone]   RxID:   [id number] LEVAQUIN 500 MG TABS (LEVOFLOXACIN) 1 tab by mouth daily x 14 d  #14 x 0   Entered and Authorized by:   Danise Edge MD   Signed by:   Danise Edge MD on 02/15/2010   Method used:   [facility]ronically to        [facility]  [address] [phone]* (retail)       [address] [place], [place]  [address]       Ph: [phone] or [phone]       Fax: [phone]   RxID:   [id number]  VIBRYD 40MG  TAB 1 tab by mouth daily  #30 x 3   Entered and Authorized by:   Danise Edge MD   Signed by:   Danise Edge MD on 02/15/2010   Method used:   Faxed to ...       [facility]  [address] [phone][facility] (retail)       [address]       [place], [place]  [address]       Ph: [phone] or [phone]       Fax: [phone]   RxID:   [id number]    Orders Added: 1)  Gastroenterology Referral [GI] 2)  Est. Patient Level IV [[address]]

## 2010-02-22 NOTE — Progress Notes (Signed)
Summary: Change PCP  Phone Note Call from Patient Call back at [phone]   Caller: Patient Summary of Call: Patient wants to be seen and also wants to change doctors due to location.  Initial call taken by: Georga Bora,  February 14, 2010 2:59 PM  Follow-up for Phone Call        may change mds  good pt Follow-up by: Stacie Glaze MD,  February 14, 2010 3:05 PM  Additional Follow-up for Phone Call Additional follow up Details #1::        Ok with me, thanks Additional Follow-up by: Danise Edge MD,  February 14, 2010 4:18 PM    Additional Follow-up for Phone Call Additional follow up Details #2::    Called patient to schedule appt. Follow-up by: Georga Bora,  February 14, 2010 4:54 PM

## 2010-03-06 ENCOUNTER — Encounter (INDEPENDENT_AMBULATORY_CARE_PROVIDER_SITE_OTHER): Payer: Self-pay | Admitting: *Deleted

## 2010-03-08 ENCOUNTER — Encounter: Payer: Self-pay | Admitting: Gastroenterology

## 2010-03-14 NOTE — Letter (Signed)
Summary: Moviprep Instructions  [facility]  [address]   [place], [place] [address]   Phone: [phone]  Fax: [phone]       Jennifer Sandoval    11/01/1955    MRN: [id number]        Procedure Day /Date:  Wednesday, 03-22-10     Arrival Time: 12:30 p.m.     Procedure Time: 1:30 p.m.     Location of Procedure:                    x  [facility] (4th Floor)   PREPARATION FOR COLONOSCOPY WITH MOVIPREP   Starting 5 days prior to your procedure 03-17-10 do not eat nuts, seeds, popcorn, corn, beans, peas,  salads, or any raw vegetables.  Do not take any fiber supplements (e.g. Metamucil, Citrucel, and Benefiber).  THE DAY BEFORE YOUR PROCEDURE         DATE: 03-21-10  DAY: Tuesday  1.  Drink clear liquids the entire day-NO SOLID FOOD  2.  Do not drink anything colored red or purple.  Avoid juices with pulp.  No orange juice.  3.  Drink at least 64 oz. (8 glasses) of fluid/clear liquids during the day to prevent dehydration and help the prep work efficiently.  CLEAR LIQUIDS INCLUDE: Water Jello Ice Popsicles Tea (sugar ok, no milk/cream) Powdered fruit flavored drinks Coffee (sugar ok, no milk/cream) Gatorade Juice: apple, white grape, white cranberry  Lemonade Clear bullion, consomm, broth Carbonated beverages (any kind) Strained chicken noodle soup Hard Candy                             4.  In the morning, mix first dose of MoviPrep solution:    Empty 1 Pouch A and 1 Pouch B into the disposable container    Add lukewarm drinking water to the top line of the container. Mix to dissolve    Refrigerate (mixed solution should be used within 24 hrs)  5.  Begin drinking the prep at 5:00 p.m. The MoviPrep container is divided by 4 marks.   Every 15 minutes drink the solution down to the next mark (approximately 8 oz) until the full liter is complete.   6.  Follow completed prep with 16 oz of clear liquid of your choice (Nothing red or  purple).  Continue to drink clear liquids until bedtime.  7.  Before going to bed, mix second dose of MoviPrep solution:    Empty 1 Pouch A and 1 Pouch B into the disposable container    Add lukewarm drinking water to the top line of the container. Mix to dissolve    Refrigerate  THE DAY OF YOUR PROCEDURE      DATE: 03-22-10  DAY: Wednesday  Beginning at 8:30 a.m. (5 hours before procedure):         1. Every 15 minutes, drink the solution down to the next mark (approx 8 oz) until the full liter is complete.  2. Follow completed prep with 16 oz. of clear liquid of your choice.    3. You may drink clear liquids until 11:30 a.m. (2 HOURS BEFORE PROCEDURE).   MEDICATION INSTRUCTIONS  Unless otherwise instructed, you should take regular prescription medications with a small sip of water   as early as possible the morning of your procedure.         OTHER INSTRUCTIONS  You will need a responsible adult at  least 54 years of age to accompany you and drive you home.   This person must remain in the waiting room during your procedure.  Wear loose fitting clothing that is easily removed.  Leave jewelry and other valuables at home.  However, you may wish to bring a book to read or  an iPod/MP3 player to listen to music as you wait for your procedure to start.  Remove all body piercing jewelry and leave at home.  Total time from sign-in until discharge is approximately 2-3 hours.  You should go home directly after your procedure and rest.  You can resume normal activities the  day after your procedure.  The day of your procedure you should not:   Drive   Make legal decisions   Operate machinery   Drink alcohol   Return to work  You will receive specific instructions about eating, activities and medications before you leave.    The above instructions have been reviewed and explained to me by   Wyona Almas RN  March 08, 2010 10:25 AM     I fully understand and  can verbalize these instructions _____________________________ Date _________

## 2010-03-14 NOTE — Miscellaneous (Signed)
Summary: LECPREVISIT/PREP  Clinical Lists Changes  Medications: Added new medication of MOVIPREP 100 GM  SOLR (PEG-KCL-NACL-NASULF-NA ASC-C) As per prep instructions. - Signed Rx of MOVIPREP 100 GM  SOLR (PEG-KCL-NACL-NASULF-NA ASC-C) As per prep instructions.;  #1 x 0;  Signed;  Entered by: Wyona Almas RN;  Authorized by: Mardella Layman MD [facility];  Method used: [facility]  [address] [phone] [address] [place], [place], [place]  [address], Ph: [phone] or [phone], Fax: [phone] Observations: Added new observation of ALLERGY REV: Done (03/08/2010 9:58)    Prescriptions: MOVIPREP 100 GM  SOLR (PEG-KCL-NACL-NASULF-NA ASC-C) As per prep instructions.  #1 x 0   Entered by:   Wyona Almas RN   Authorized by:   Mardella Layman MD [facility]   Signed by:   Wyona Almas RN on 03/08/2010   Method used:   [facility] to        [facility]  [address] [phone]* (retail)       [address] [place] [address]       [place], [place]  [address]       Ph: [phone] or [phone]       Fax: [phone]   RxID:   [id number]

## 2010-03-16 ENCOUNTER — Ambulatory Visit (INDEPENDENT_AMBULATORY_CARE_PROVIDER_SITE_OTHER): Payer: Commercial Indemnity | Admitting: Family Medicine

## 2010-03-16 ENCOUNTER — Encounter: Payer: Self-pay | Admitting: Family Medicine

## 2010-03-16 DIAGNOSIS — F3289 Other specified depressive episodes: Secondary | ICD-10-CM

## 2010-03-16 DIAGNOSIS — Z780 Asymptomatic menopausal state: Secondary | ICD-10-CM

## 2010-03-16 DIAGNOSIS — J309 Allergic rhinitis, unspecified: Secondary | ICD-10-CM

## 2010-03-16 DIAGNOSIS — IMO0001 Reserved for inherently not codable concepts without codable children: Secondary | ICD-10-CM

## 2010-03-16 DIAGNOSIS — F329 Major depressive disorder, single episode, unspecified: Secondary | ICD-10-CM

## 2010-03-16 HISTORY — DX: Asymptomatic menopausal state: Z78.0

## 2010-03-19 ENCOUNTER — Encounter: Payer: Self-pay | Admitting: Family Medicine

## 2010-03-20 ENCOUNTER — Encounter: Payer: Self-pay | Admitting: Family Medicine

## 2010-03-20 ENCOUNTER — Telehealth: Payer: Self-pay | Admitting: *Deleted

## 2010-03-20 ENCOUNTER — Telehealth: Payer: Self-pay | Admitting: Gastroenterology

## 2010-03-21 NOTE — Assessment & Plan Note (Signed)
Summary: follow up appt   Vital Signs:  Patient profile:   55 year old female Height:      65 inches (165.10 cm) Weight:      150.25 pounds (68.30 kg) O2 Sat:      96 % on Room air Temp:     98.2 degrees F (36.78 degrees C) oral Pulse rate:   94 / minute BP sitting:   122 / 83  (right arm) Cuff size:   regular  Vitals Entered By: Josph Macho RMA (March 16, 2010 8:50 AM)  O2 Flow:  Room air CC: follow-up visit/ CF Is Patient Diabetic? No   History of Present Illness:  is a 55 year old Caucasian female who is in today for followup. At her last visit we started Viibryd and she has titrated up to 40 mg. She's not had any GI disturbance but is noting some increased irritability and would like to changed medications. In the past she has tried Wellbutrin and Zoloft and they have been ineffective. She continues to struggle with chronic pain but she has started Osteo Bi-Flex and she does think that has helped her knees but not necessarily her handsfeet or calf pain. She  denies any recent illness, fevers, chills, chest pain, palpitations, shortness of breath, GI or GU concerns. She denies any suicidal or homicidal ideation but does struggle with some anhedonia and anxiety at times.  Current Medications (verified): 1)  Vitamin B-12 Cr 1000 Mcg  Tbcr (Cyanocobalamin) .... One Q Day 2)  Ambien 10 Mg  Tabs (Zolpidem Tartrate) .... Q Hs As Needed 3)  Vitamin D 2500 .Marland Kitchen.. 1 Once Daily' 4)  Claritin-D 24 Hour 10-240 Mg Xr24h-Tab (Loratadine-Pseudoephedrine) .Marland Kitchen.. 1 Once Daily 5)  Vibryd 40mg  Tab .Marland Kitchen.. 1 Tab By Mouth Daily 6)  Fluticasone Propionate 50 Mcg/act Susp (Fluticasone Propionate) .... 2 Sprays Each Nostril Daily As Needed Allergies 7)  Moviprep 100 Gm  Solr (Peg-Kcl-Nacl-Nasulf-Na Asc-C) .... As Per Prep Instructions.  Allergies (verified): 1)  ! Sporanox (Itraconazole)  Past History:  Past medical history reviewed for relevance to current acute and chronic problems. Social history  (including risk factors) reviewed for relevance to current acute and chronic problems.  Past Medical History: Reviewed history from 12/03/2006 and no changes required. Anemia-iron deficiency Allergic rhinitis Depression  Social History: Reviewed history from 02/15/2010 and no changes required. Married Never Smoked Occupation: Arts development officer, previously casualty claims Drug use-no Regular exercise-no wears a seat belt No dietary restrictions Alcohol use-no  Review of Systems      See HPI  Physical Exam  General:  Well-developed,well-nourished,in no acute distress; alert,appropriate and cooperative throughout examination Head:  Normocephalic and atraumatic without obvious abnormalities. No apparent alopecia or balding. Mouth:  Oral mucosa and oropharynx without lesions or exudates.  Teeth in good repair. Neck:  No deformities, masses, or tenderness noted. Lungs:  Normal respiratory effort, chest expands symmetrically. Lungs are clear to auscultation, no crackles or wheezes. Heart:  Normal rate and regular rhythm. S1 and S2 normal without gallop, murmur, click, rub or other extra sounds. Abdomen:  Bowel sounds positive,abdomen soft and non-tender without masses, organomegaly or hernias noted. Msk:  No deformity or scoliosis noted of thoracic or lumbar spine.   Extremities:  No clubbing, cyanosis, edema, or deformity noted with normal full range of motion of all joints.   Skin:  Intact without suspicious lesions or rashes Psych:  Cognition and judgment appear intact. Alert and cooperative with normal attention span and concentration. No apparent delusions, illusions,  hallucinations   Impression & Recommendations:  Problem # 1:  DEPRESSION (ICD-311)  The following medications were removed from the medication list:    Zoloft 100 Mg Tabs (Sertraline hcl) .Marland Kitchen... 1/2 tab by mouth once daily x 7 days then stop Her updated medication list for this problem includes:    Pristiq 50 Mg  Xr24h-tab (Desvenlafaxine succinate) .Marland Kitchen... 1 tab by mouth daily Patient is noting some increased irritability with the Viibryd from last month  and would like to stop it. She is given samples of the 10 and 20 mg tabs will take 2omg once daily x 3 days then decreased to 10mg  once daily x 3 days and then stop.  Problem # 2:  MYOFASCIAL PAIN SYNDROME (ICD-729.1) Encouraged increased exercise and sleep as well as healthy diet. Pristiq may be helpful as well.  Problem # 3:  PERIMENOPAUSAL STATUS (ICD-V49.81) Having more hot flashes recently, is encouraged to get sleep and exercise, do not skip meals and do not eat excessive carbs. If no improvement on Pristiq can consider adding Evening Primrose Oil daily  Problem # 4:  ALLERGIC RHINITIS (ICD-477.9)  Her updated medication list for this problem includes:    Fluticasone Propionate 50 Mcg/act Susp (Fluticasone propionate) .Marland Kitchen... 2 sprays each nostril daily as needed allergies Doing well at this time using meds intermittently  Complete Medication List: 1)  Vitamin B-12 Cr 1000 Mcg Tbcr (Cyanocobalamin) .... One q day 2)  Ambien 10 Mg Tabs (Zolpidem tartrate) .... Q hs as needed 3)  Vitamin D 2500  .Marland Kitchen.. 1 once daily' 4)  Claritin-d 24 Hour 10-240 Mg Xr24h-tab (Loratadine-pseudoephedrine) .Marland Kitchen.. 1 once daily 5)  Fluticasone Propionate 50 Mcg/act Susp (Fluticasone propionate) .... 2 sprays each nostril daily as needed allergies 6)  Moviprep 100 Gm Solr (Peg-kcl-nacl-nasulf-na asc-c) .... As per prep instructions. 7)  Pristiq 50 Mg Xr24h-tab (Desvenlafaxine succinate) .Marland Kitchen.. 1 tab by mouth daily 8)  Pristiq 50 Mg Xr24h-tab (Desvenlafaxine succinate) .Marland Kitchen.. 1 tab by mouth daily  Patient Instructions: 1)  Please schedule a follow-up appointment in 1 to 2 months or as needed 2)  Consider Evening Primrose Oil daily if hot flashes continue 3)  Need regular exercise, 7-8 hours of sleep and do not skip meals Prescriptions: PRISTIQ 50 MG XR24H-TAB (DESVENLAFAXINE  SUCCINATE) 1 tab by mouth daily  #30 x 1   Entered and Authorized by:   Danise Edge MD   Signed by:   Danise Edge MD on 03/16/2010   Method used:   [facility] to        [facility]  [address] [phone][facility] (retail)       [address] [place], [place]  [address]       Ph: [phone] or [phone]       Fax: [phone]   RxID:   [id number] PRISTIQ 50 MG XR24H-TAB (DESVENLAFAXINE SUCCINATE) 1 tab by mouth daily  #14 x 0   Entered and Authorized by:   Danise Edge MD   Signed by:   Danise Edge MD on 03/16/2010   Method used:   [facility]        [facility]  [address] [phone]* (retail)       [address] [place], [place]  [address]       Ph: [phone] or [phone]       Fax: [phone]  RxID:   [id number]    Orders Added: 1)  Est. Patient Level III [[address]]

## 2010-03-22 ENCOUNTER — Ambulatory Visit (INDEPENDENT_AMBULATORY_CARE_PROVIDER_SITE_OTHER): Payer: Commercial Indemnity | Admitting: Family Medicine

## 2010-03-22 ENCOUNTER — Encounter: Payer: Self-pay | Admitting: Family Medicine

## 2010-03-22 ENCOUNTER — Other Ambulatory Visit: Payer: Commercial Indemnity | Admitting: Gastroenterology

## 2010-03-22 VITALS — BP 125/86 | HR 88 | Temp 98.6°F | Ht 62.0 in | Wt 149.0 lb

## 2010-03-22 DIAGNOSIS — F329 Major depressive disorder, single episode, unspecified: Secondary | ICD-10-CM

## 2010-03-22 DIAGNOSIS — F3289 Other specified depressive episodes: Secondary | ICD-10-CM

## 2010-03-22 DIAGNOSIS — IMO0001 Reserved for inherently not codable concepts without codable children: Secondary | ICD-10-CM

## 2010-03-22 DIAGNOSIS — K5289 Other specified noninfective gastroenteritis and colitis: Secondary | ICD-10-CM

## 2010-03-22 DIAGNOSIS — K529 Noninfective gastroenteritis and colitis, unspecified: Secondary | ICD-10-CM

## 2010-03-22 MED ORDER — RANITIDINE HCL 300 MG PO TABS: 300.0000 mg | ORAL_TABLET | Freq: Every day | ORAL | Status: DC

## 2010-03-22 MED ORDER — ALIGN 4 MG PO CAPS: 1.0000 | ORAL_CAPSULE | Freq: Every day | ORAL | Status: AC

## 2010-03-22 NOTE — Progress Notes (Addendum)
  Subjective:    Patient ID: Jennifer Sandoval, female    DOB: 08/11/1955, 55 y.o.   MRN: [id number]  HPI Patient is a 55 y.o. Caucasian female in  Today with concerns regarding GI upset. Last week she was in contact with a teenager who developed a bad gastroenteritis the next day. The youth had vomiting and diarrhea and has recovered. About 4 days ago the patient was awakened in the middle of the night with epigastic pain/crammping/dyspepsia/sour fluid in her throat and some substernal CP. She denies any SOB/palp/fevers/chills but she does note when the epigastric cramping was the worst she became diaphoretic once. She reports it felt like a gallbladder attack but she had already had her gallbladder taken out. The next day she ate a bland diet and had some diarrhea and occasional cramping. She thought she was improving but then Monday night she was awakened again with pain and this time it radiated slightly to her left upper anterior CW so she got a little nervous. Yesterday she took some Pepto Bismol and Zantac and felt somewhat better. She has not had any CP during the day.    Review of Systems  Constitutional: Positive for diaphoresis and appetite change. Negative for fever, chills, activity change and fatigue.  HENT: Positive for sore throat. Negative for congestion, mouth sores, trouble swallowing, neck pain and neck stiffness.   Respiratory: Negative for cough, choking, chest tightness, shortness of breath and wheezing.   Cardiovascular: Negative for chest pain, palpitations and leg swelling.  Gastrointestinal: Positive for nausea, abdominal pain and diarrhea. Negative for vomiting, constipation, blood in stool, anal bleeding and rectal pain.  Musculoskeletal: Negative for myalgias.  Skin: Negative for pallor.  Neurological: Negative for dizziness and syncope.  Psychiatric/Behavioral: Negative for agitation.       Objective:   Physical Exam  Constitutional: She is oriented to person,  place, and time. She appears well-developed and well-nourished. No distress.  HENT:  Head: Normocephalic and atraumatic.  Eyes: Conjunctivae are normal.  Neck: Normal range of motion. Neck supple.  Cardiovascular: Normal rate and normal heart sounds.   No murmur heard. Pulmonary/Chest: Effort normal and breath sounds normal.  Abdominal: Soft. She exhibits no mass. There is tenderness. There is no rebound and no guarding.       Mildly tender in epigastrium  Neurological: She is alert and oriented to person, place, and time.  Skin: Skin is warm.  Psychiatric: She has a normal mood and affect. Her behavior is normal.          Assessment & Plan:  Gastroenteritis, acute Patient to maintain a bland diet and increase fluid intake. She will start Ranitidine 300mg  po daily for the next couple of weeks. She should start a probiotic such as Align daily and a yogurt daily as well. Notify us of any worsening symptoms. Thus far symptoms are not alarming for cardiac disease but if her symptoms worsen or change she needs to notify us or seek immediate medical care.  DEPRESSION She has recently switched from Viibryd to Pristiq and she does feel her irritability is improving. It is unlikely that her Pristiq is the culprit in the GI upset but if it is this should resolve in the next 1-2 weeks.  MYOFASCIAL PAIN SYNDROME Improved with switch from Viibryd to Pristiq, will continue to monitor

## 2010-03-22 NOTE — Assessment & Plan Note (Signed)
She has recently switched from Viibryd to [employer] and she does feel her irritability is improving. It is unlikely that her Pristiq is the culprit in the GI upset but if it is this should resolve in the next 1-2 weeks.

## 2010-03-22 NOTE — Assessment & Plan Note (Signed)
Patient to maintain a bland diet and increase fluid intake. She will start Ranitidine 300mg  po daily for the next couple of weeks. She should start a probiotic such as Align daily and a yogurt daily as well. Notify us of any worsening symptoms. Thus far symptoms are not alarming for cardiac disease but if her symptoms worsen or change she needs to notify us or seek immediate medical care.

## 2010-03-22 NOTE — Assessment & Plan Note (Signed)
Improved with switch from Viibryd to [employer], will continue to monitor

## 2010-03-22 NOTE — Patient Instructions (Signed)
Gastritis  Gastritis is an irritation of the stomach. This is often caused by medications, but can be from anything that bothers the stomach.    Other stomach irritants are:   Alcohol.    Caffeine.     Nicotine.     Spicy or acid foods.     Medications for pain and arthritis. Aspirin and other anti-inflammatory medicines such as ibuprofen (Advil), naproxen (Aleve), and ketoprofen (Orudis) can be highly irritating.    Emotional distress.      Symptoms of gastritis may include:   Abdominal pain.    Indigestion.    Nausea and or vomiting.    Bleeding.     Some patients with chronic gastritis and ulcers have been infected by a germ. They may need special testing. Medications which kill germs can be used to cure this condition.  Treatment includes avoiding the substances mentioned above that are known to cause stomach trouble. Medications used to treat gastritis can include:   Antacids.    Medicines to control vomiting.      Acid blocking medicines.      Symptoms of gastritis usually improve within 2-3 days of starting treatment. Call your caregiver if you are not better in a few days.  SEEK MEDICAL CARE IF YOU:     Have increased stomach or chest pain.    Vomit blood.      Faint or feel light headed.     Can not keep fluids down.    Pass bloody or black stools.      Develop severe back pain.      MAKE SURE YOU:     Understand these instructions.    Will watch your condition.    Will get help right away if you are not doing well or get worse.   Document Released: 12/18/2004 Document Re-Released: 03/16/2008  [employer] Patient Information 2011 [employer].

## 2010-03-27 ENCOUNTER — Telehealth: Payer: Self-pay

## 2010-03-27 NOTE — Telephone Encounter (Signed)
Pt states she is still having upper abdomen pain. Medication seemed to help the pain in the morning but the pain is still there all day. X 7 days bloated and has gas.

## 2010-03-28 MED ORDER — SUCRALFATE 1 G PO TABS: 1.0000 g | ORAL_TABLET | Freq: Four times a day (QID) | ORAL | Status: DC

## 2010-03-28 NOTE — Telephone Encounter (Signed)
Pt states her stomach is starting to feel better and will come in probably on Thursday to do labs.

## 2010-03-28 NOTE — Telephone Encounter (Signed)
Patient needs to maintain a bland diet for the rest of the week, no fatty, spicy or acidic foods, only small meals. If no improvement may try a couple day course of Carafate to coat her stomach and let it heal, continue Ranitidine and come in for labs including a CBC, renal, heapatic and H Pylori Serum. Will send in Carafate tabs to pharmacy. Labs tomorrow or the next day

## 2010-03-30 ENCOUNTER — Other Ambulatory Visit (INDEPENDENT_AMBULATORY_CARE_PROVIDER_SITE_OTHER): Payer: Commercial Indemnity | Admitting: Family Medicine

## 2010-03-30 DIAGNOSIS — R109 Unspecified abdominal pain: Secondary | ICD-10-CM

## 2010-03-30 NOTE — Progress Notes (Signed)
Summary: Canceled COL  Phone Note Call from Patient   Caller: Patient Call For: Dr. Jarold Motto Reason for Call: Talk to Nurse Summary of Call: Pt. canceled her COL for 03-22-10. She originally spoke with Ezra Sites and explained to her that she is sick running a temp and diarrhea. Would you like pt. charged the cancelation fee? Initial call taken by: Karna Christmas,  March 20, 2010 11:58 AM  Follow-up for Phone Call        NO....she needs to reschedule Follow-up by: Mardella Layman MD FACG,  March 20, 2010 12:04 PM  Additional Follow-up for Phone Call Additional follow up Details #1::        Talked with pt. she said she will call back to resch'd once she gets to feeling better. Additional Follow-up by: Karna Christmas,  March 20, 2010 1:35 PM    Additional Follow-up for Phone Call Additional follow up Details #2::    Patient NOT BILLED. Follow-up by: Leanor Kail [facility],  March 20, 2010 4:46 PM

## 2010-03-30 NOTE — Progress Notes (Signed)
Summary: Procedure ?s  Phone Note Call from Patient Call back at Home Phone [phone]   Caller: Patient Call For: Dr. Jarold Motto Reason for Call: Talk to Nurse Summary of Call: patient had stomach bug yesterday and today and she needs to know if she should  keep her colon for wednesday or R/S Initial call taken by: [place] Johnson,  March 20, 2010 10:13 AM  Follow-up for Phone Call        Pt says she is running a fever and has diarrhea.  She will call back and reschedule colonoscopy when she feels better. Ezra Sites RN  March 20, 2010 11:55 AM

## 2010-03-31 ENCOUNTER — Telehealth: Payer: Self-pay | Admitting: Gastroenterology

## 2010-03-31 LAB — RENAL FUNCTION PANEL
Glucose, Bld: 75 mg/dL (ref 70–99)
Sodium: 141 mEq/L (ref 135–145)

## 2010-03-31 LAB — HEPATIC FUNCTION PANEL
ALT: 10 U/L (ref 0–35)
AST: 20 U/L (ref 0–37)
Albumin: 4 g/dL (ref 3.5–5.2)
Alkaline Phosphatase: 92 U/L (ref 39–117)
Total Bilirubin: 0.2 mg/dL — ABNORMAL LOW (ref 0.3–1.2)

## 2010-03-31 LAB — CBC WITH DIFFERENTIAL/PLATELET
Basophils Absolute: 0 10*3/uL (ref 0.0–0.1)
Basophils Relative: 0.4 % (ref 0.0–3.0)
Eosinophils Absolute: 0.1 10*3/uL (ref 0.0–0.7)
HCT: 33.9 % — ABNORMAL LOW (ref 36.0–46.0)
Hemoglobin: 11 g/dL — ABNORMAL LOW (ref 12.0–15.0)
Lymphocytes Relative: 39 % (ref 12.0–46.0)
Lymphs Abs: 3 10*3/uL (ref 0.7–4.0)
MCHC: 32.5 g/dL (ref 30.0–36.0)
MCV: 83 fl (ref 78.0–100.0)
Monocytes Absolute: 0.4 10*3/uL (ref 0.1–1.0)
Neutro Abs: 4.2 10*3/uL (ref 1.4–7.7)
Platelets: 290 10*3/uL (ref 150.0–400.0)
RBC: 4.09 Mil/uL (ref 3.87–5.11)

## 2010-03-31 NOTE — Telephone Encounter (Signed)
no

## 2010-03-31 NOTE — Telephone Encounter (Signed)
Sent to Karna Christmas to call patient and reschedule

## 2010-03-31 NOTE — Telephone Encounter (Signed)
Pt. resch'd her COL until 05-10-10. Advised of cx policy.

## 2010-04-03 ENCOUNTER — Other Ambulatory Visit: Payer: Commercial Indemnity | Admitting: Gastroenterology

## 2010-04-19 ENCOUNTER — Ambulatory Visit (INDEPENDENT_AMBULATORY_CARE_PROVIDER_SITE_OTHER): Payer: Commercial Indemnity | Admitting: Family Medicine

## 2010-04-19 ENCOUNTER — Encounter: Payer: Self-pay | Admitting: Family Medicine

## 2010-04-19 VITALS — BP 126/86 | HR 71 | Temp 98.1°F | Ht 65.0 in | Wt 142.0 lb

## 2010-04-19 DIAGNOSIS — K3189 Other diseases of stomach and duodenum: Secondary | ICD-10-CM

## 2010-04-19 DIAGNOSIS — K529 Noninfective gastroenteritis and colitis, unspecified: Secondary | ICD-10-CM

## 2010-04-19 DIAGNOSIS — K5289 Other specified noninfective gastroenteritis and colitis: Secondary | ICD-10-CM

## 2010-04-19 DIAGNOSIS — F3289 Other specified depressive episodes: Secondary | ICD-10-CM

## 2010-04-19 DIAGNOSIS — F329 Major depressive disorder, single episode, unspecified: Secondary | ICD-10-CM

## 2010-04-19 DIAGNOSIS — J309 Allergic rhinitis, unspecified: Secondary | ICD-10-CM

## 2010-04-19 DIAGNOSIS — R1013 Epigastric pain: Secondary | ICD-10-CM | POA: Insufficient documentation

## 2010-04-19 DIAGNOSIS — J329 Chronic sinusitis, unspecified: Secondary | ICD-10-CM

## 2010-04-19 DIAGNOSIS — G4700 Insomnia, unspecified: Secondary | ICD-10-CM

## 2010-04-19 DIAGNOSIS — D649 Anemia, unspecified: Secondary | ICD-10-CM

## 2010-04-19 HISTORY — DX: Epigastric pain: R10.13

## 2010-04-19 MED ORDER — RANITIDINE HCL 300 MG PO TABS: 300.0000 mg | ORAL_TABLET | Freq: Two times a day (BID) | ORAL | Status: DC | PRN

## 2010-04-19 MED ORDER — ZOLPIDEM TARTRATE 10 MG PO TABS: 10.0000 mg | ORAL_TABLET | Freq: Every evening | ORAL | Status: DC | PRN

## 2010-04-19 NOTE — Assessment & Plan Note (Signed)
Symptoms improving, took a month's worth of the Ranitidine at 300mg , avoided offending foods and gave up her Aleve and now she feels much better. She has occasional symptoms that respond to Ranitidine at the 150-300mg  dosing. May continue the same

## 2010-04-19 NOTE — Assessment & Plan Note (Signed)
Resolved, no further antibiotics at this time

## 2010-04-19 NOTE — Assessment & Plan Note (Signed)
Reviewed recent labs with patient which showed mild anemia, she is asked to increase leafy greens and red meats in diet and we will reassess

## 2010-04-19 NOTE — Assessment & Plan Note (Signed)
Patient much happier with the Pristiq than the Zoloft or Viibryd. The Zoloft was ineffective and the Viibryd caused diffuse muscle and body aches. She will continue on 50 mg daily for now and we will reassess in 3 months or as needed

## 2010-04-19 NOTE — Assessment & Plan Note (Signed)
Doing well on Flonase and Claritin.

## 2010-04-19 NOTE — Progress Notes (Signed)
Jennifer Sandoval [id number] 1955/06/28 04/19/2010      Progress Note-Follow Up  Subjective  Chief Complaint  Chief Complaint  Patient presents with  . GI Problem    follow up    HPI  Patient is a 55 year old Caucasian female in today for followup on medical problems. At her last visit we switched her from Viibryd to Pristiq for her depression and anxiety. I have caused diffuse myalgias and arthralgias with stopping that medication her symptoms resolved. Unfortunate with a myalgia she took some Aleve which can cause dyspepsia. Now that she has stopped the Viibryd and the Aleve for dyspepsia is 90% better she takes an occasional ranitidine. She has no more abdominal pain and and after having an episode of gastroenteritis with nausea vomiting and diarrhea she developed some recent constipation but after several doses of Colace that has resolved as well. She is happy with the stabilization of her mood on the per speak. She reports feeling less anxious and irritable, concentrating better and having an improving mood. Overall she feels much better than she did at her last visit. She denies chest pain, palpitations, shortness of breath, fevers, chills, GU complaints.  Past Medical History  Diagnosis Date  . Allergy     rhinitis  . Anemia     iron deficeincy  . Depression   . Lumbar compression fracture     nonsurgical  . SHOULDER PAIN, BILATERAL 10/10/2007  . PERIMENOPAUSAL STATUS 03/16/2010  . OSTEOARTHRITIS 10/24/2006  . Leiomyoma of uterus, unspecified 02/15/2010  . FRACTURE, NOSE 10/22/2007  . DEPRESSION 10/24/2006  . BASAL CELL CARCINOMA SKIN LOWER LIMB INCL HIP 02/15/2010  . ANEMIA-NOS 10/24/2006  . ALLERGIC RHINITIS 08/15/2006  . Dyspepsia 04/19/2010    Past Surgical History  Procedure Date  . Septoplasty   . Gastric bypass   . Tubal ligation   . Nasal sinus surgery   . Cesarean section     X 3  . Appendectomy   . Cholecystectomy   . Ventral herniorrhaphy     X 2  .  Abdominal hysterectomy 2009    partial, ovaries left in place, for painful, heavy menstrrual bleeding secondary to anemia and fibroids  . Skin biopsy     L hip BCC, facial lesions were benign    Family History  Problem Relation Age of Onset  . Cholelithiasis Mother   . Other Mother     Urinary incontince/ bladder prolapse/ h/o smoking  . Cancer Mother     laryngeal carcinoma  . Allergies Mother   . Other Father     Renal failure  . Hypertension Father   . Coronary artery disease Father     s/p bypass  . Aortic aneurysm Father   . Hyperlipidemia Father   . Heart disease Father   . Spina bifida Brother     with stunt/ self cath  . Seizures Brother     disorders  . ADD / ADHD Daughter   . Cancer Maternal Grandmother     colon/ smoker  . Cancer Maternal Grandfather     lung  . Cancer Paternal Grandmother     Breast  . Heart disease Paternal Grandfather   . Stroke Paternal Grandfather   . ADD / ADHD Daughter     History   Social History  . Marital Status: Married    Spouse Name: N/A    Number of Children: N/A  . Years of Education: N/A   Occupational History  . Not on file.  Social History Main Topics  . Smoking status: Never Smoker   . Smokeless tobacco: Never Used  . Alcohol Use: Yes     special occasion  . Drug Use: No  . Sexually Active: Yes -- Female partner(s)   Other Topics Concern  . Not on file   Social History Narrative  . No narrative on file    Current Outpatient Prescriptions on File Prior to Visit  Medication Sig Dispense Refill  . desvenlafaxine (PRISTIQ) 50 MG 24 hr tablet Take 50 mg by mouth daily.        . fluticasone (FLONASE) 50 MCG/ACT nasal spray 2 sprays by Nasal route daily as needed. For allergies       . vitamin B-12 (CYANOCOBALAMIN) 1000 MCG tablet Take 1,000 mcg by mouth daily.        Marland Kitchen VITAMIN D, CHOLECALCIFEROL, PO Take 1 tablet by mouth daily.        Marland Kitchen DISCONTD: ranitidine (ZANTAC) 300 MG tablet Take 1 tablet (300 mg  total) by mouth at bedtime.  30 tablet  1  . loratadine-pseudoephedrine (CLARITIN-D 24-HOUR) 10-240 MG per 24 hr tablet Take 1 tablet by mouth daily.        . Probiotic Product (ALIGN) 4 MG CAPS Take 1 capsule by mouth daily.  30 capsule  1  . DISCONTD: levofloxacin (LEVAQUIN) 500 MG tablet Take 500 mg by mouth daily. X 14 days       . DISCONTD: sertraline (ZOLOFT) 100 MG tablet Take 100 mg by mouth daily. 1/2 tab po once daily X 7 days then stop       . DISCONTD: Vilazodone HCl (VIIBRYD) 40 MG TABS Take 1 tablet by mouth daily.        Marland Kitchen DISCONTD: zolpidem (AMBIEN) 10 MG tablet Take 10 mg by mouth at bedtime as needed.          Allergies  Allergen Reactions  . Itraconazole     REACTION: Rash    Review of Systems  Review of Systems  Constitutional: Negative for fever, chills, weight loss and malaise/fatigue.  HENT: Negative for congestion.   Eyes: Negative for discharge.  Respiratory: Negative for shortness of breath.   Cardiovascular: Negative for chest pain, palpitations and leg swelling.  Gastrointestinal: Positive for heartburn and constipation. Negative for nausea, vomiting, abdominal pain, diarrhea, blood in stool and melena.  Genitourinary: Negative for dysuria and urgency.  Musculoskeletal: Negative for myalgias, joint pain and falls.  Skin: Negative for rash.  Neurological: Negative for loss of consciousness and headaches.  Endo/Heme/Allergies: Negative for polydipsia.  Psychiatric/Behavioral: Negative for depression and suicidal ideas. The patient is not nervous/anxious and does not have insomnia.     Objective  BP 126/86  Pulse 71  Temp(Src) 98.1 F (36.7 C) (Oral)  Ht 5\' 5"  (1.651 m)  Wt 142 lb (64.411 kg)  BMI 23.63 kg/m2  SpO2 100%  Physical Exam  Physical Exam  Constitutional: She is oriented to person, place, and time and well-developed, well-nourished, and in no distress. No distress.  HENT:  Head: Normocephalic and atraumatic.  Eyes: Conjunctivae are  normal.  Neck: Neck supple. No thyromegaly present.  Cardiovascular: Normal rate, regular rhythm and normal heart sounds.   No murmur heard. Pulmonary/Chest: Effort normal and breath sounds normal. She has no wheezes.  Abdominal: She exhibits no distension and no mass.  Musculoskeletal: She exhibits no edema.  Lymphadenopathy:    She has no cervical adenopathy.  Neurological: She is alert and oriented to person,  place, and time.  Skin: Skin is warm and dry. No rash noted. She is not diaphoretic.  Psychiatric: Mood, memory, affect and judgment normal.    Lab Results  Component Value Date   TSH 1.08 10/08/2006   Lab Results  Component Value Date   WBC 7.8 03/30/2010   HGB 11.0* 03/30/2010   HCT 33.9* 03/30/2010   MCV 83.0 03/30/2010   PLT 290.0 03/30/2010   Lab Results  Component Value Date   CREATININE 0.8 03/30/2010   BUN 14 03/30/2010   NA 141 03/30/2010   K 4.0 03/30/2010   CL 105 03/30/2010   CO2 31 03/30/2010   Lab Results  Component Value Date   ALT 10 03/30/2010   AST 20 03/30/2010   ALKPHOS 92 03/30/2010   BILITOT 0.2* 03/30/2010   Lab Results  Component Value Date   CHOL 124 10/08/2006   Lab Results  Component Value Date   HDL 50.7 10/08/2006   Lab Results  Component Value Date   LDLCALC 49 10/08/2006   Lab Results  Component Value Date   TRIG 122 10/08/2006   Lab Results  Component Value Date   CHOLHDL 2.4 CALC 10/08/2006     Assessment & Plan  SINUSITIS Resolved, no further antibiotics at this time  Gastroenteritis, acute Resolved, she has recovered from all of her symptoms including nausea, vomitting and diarrhea. Encouraged ongoing probiotics, yogurt and fiber supplementation. Did develop some rebound constipation after this episode but after some doses of Colace this has improved.   DEPRESSION Patient much happier with the Pristiq than the Zoloft or Viibryd. The Zoloft was ineffective and the Viibryd caused diffuse muscle and body aches. She will  continue on 50 mg daily for now and we will reassess in 3 months or as needed  ALLERGIC RHINITIS Doing well on Flonase and Claritin.  Dyspepsia Symptoms improving, took a month's worth of the Ranitidine at 300mg , avoided offending foods and gave up her Aleve and now she feels much better. She has occasional symptoms that respond to Ranitidine at the 150-300mg  dosing. May continue the same  ANEMIA-NOS Reviewed recent labs with patient which showed mild anemia, she is asked to increase leafy greens and red meats in diet and we will reassess

## 2010-04-19 NOTE — Assessment & Plan Note (Addendum)
Resolved, she has recovered from all of her symptoms including nausea, vomitting and diarrhea. Encouraged ongoing probiotics, yogurt and fiber supplementation. Did develop some rebound constipation after this episode but after some doses of Colace this has improved.

## 2010-04-19 NOTE — Patient Instructions (Signed)
Constipation in Adults Constipation is having fewer than 2 bowel movements per week. Usually, the stools are hard. As we grow older, constipation is more common. If you try to fix constipation with laxatives, the problem may get worse. This is because laxatives taken over a long period of time make the colon muscles weaker. A low-fiber diet, not taking in enough fluids, and taking some medicines may make these problems worse. MEDICATIONS THAT MAY CAUSE CONSTIPATION  Water pills (diuretics).  Calcium channel blockers (used to control blood pressure and for the heart).   Certain pain medicines (narcotics).   Anticholinergics.  Anti-inflammatory agents.   Antacids that contain aluminum.   DISEASES THAT CONTRIBUTE TO CONSTIPATION  Diabetes.  Parkinson's disease.   Dementia.   Stroke.  Depression.   Illnesses that cause problems with salt and water metabolism.   HOME CARE INSTRUCTIONS  Constipation is usually best cared for without medicines. Increasing dietary fiber and eating more fruits and vegetables is the best way to manage constipation.   Slowly increase fiber intake to 25 to 38 grams per day. Whole grains, fruits, vegetables, and legumes are good sources of fiber. A dietitian can further help you incorporate high-fiber foods into your diet.   Drink enough water and fluids to keep your urine clear or pale yellow.   A fiber supplement may be added to your diet if you cannot get enough fiber from foods.   Increasing your activities also helps improve regularity.   Suppositories, as suggested by your caregiver, will also help. If you are using antacids, such as aluminum or calcium containing products, it will be helpful to switch to products containing magnesium if your caregiver says it is okay.   If you have been given a liquid injection (enema) today, this is only a temporary measure. It should not be relied on for treatment of longstanding (chronic) constipation.    Stronger measures, such as magnesium sulfate, should be avoided if possible. This may cause uncontrollable diarrhea. Using magnesium sulfate may not allow you time to make it to the bathroom.  SEEK IMMEDIATE MEDICAL CARE IF:  There is bright red blood in the stool.   The constipation stays for more than 4 days.   There is belly (abdominal) or rectal pain.   You do not seem to be getting better.   You have any questions or concerns.  MAKE SURE YOU:  Understand these instructions.   Will watch your condition.   Will get help right away if you are not doing well or get worse.  Document Released: 09/16/2003 Document Re-Released: 03/14/2009 [facility] Patient Information 2011 [place], [place].  Increase fiber supplements to twice a day, 64 oz of clear fluids daily, need probiotic and yogurt daily

## 2010-05-08 ENCOUNTER — Telehealth: Payer: Self-pay | Admitting: Gastroenterology

## 2010-05-08 NOTE — Telephone Encounter (Signed)
Pt changed procedure days.  Reviewed updated prep instructions with patient. Jennifer Sandoval

## 2010-05-09 ENCOUNTER — Encounter: Payer: Self-pay | Admitting: Gastroenterology

## 2010-05-10 ENCOUNTER — Encounter: Payer: Self-pay | Admitting: Gastroenterology

## 2010-05-10 ENCOUNTER — Ambulatory Visit (AMBULATORY_SURGERY_CENTER): Payer: Commercial Indemnity | Admitting: Gastroenterology

## 2010-05-10 VITALS — BP 114/49 | HR 82 | Temp 97.8°F | Resp 22 | Ht 62.0 in | Wt 140.0 lb

## 2010-05-10 DIAGNOSIS — Z1211 Encounter for screening for malignant neoplasm of colon: Secondary | ICD-10-CM

## 2010-05-10 MED ORDER — SODIUM CHLORIDE 0.9 % IV SOLN: 500.0000 mL | INTRAVENOUS | Status: DC

## 2010-05-10 NOTE — Patient Instructions (Addendum)
Colonoscopy was normal today. Repeat colonoscopy in 10 years. Continue current medications. Please read over discharge instructions given. May use Benefiber over the counter fiber supplement daily as directed.

## 2010-05-11 ENCOUNTER — Telehealth: Payer: Self-pay | Admitting: *Deleted

## 2010-05-11 NOTE — Telephone Encounter (Signed)

## 2010-05-16 NOTE — Op Note (Signed)
Jennifer Sandoval, Jennifer Sandoval            ACCOUNT NO.:  [id number]   MEDICAL RECORD NO.:  [id number]          PATIENT TYPE:  OIB   LOCATION:  0164                         FACILITY:  [facility]   PHYSICIAN:  M. Leda Quail, MD  DATE OF BIRTH:  [birth year]   DATE OF PROCEDURE:  02/25/2007  DATE OF DISCHARGE:                               OPERATIVE REPORT   PREOPERATIVE DIAGNOSIS:  1. Fibroid uterus.  2. Menorrhagia.  3. Resulting anemia.  4. History of gastric bypass.  5. History of the laparoscopic cholecystectomy.  6. History of cesarean section x3.   POSTOPERATIVE DIAGNOSIS:  1. Fibroid uterus.  2. Menorrhagia.  3. Resulting anemia.  4. History of gastric bypass.  5. History of the laparoscopic cholecystectomy.  6. History of cesarean section x3.  7. Omental adhesions to the anterior abdominal wall above the      umbilicus.   PROCEDURE:  Laparoscopically assisted vaginal hysterectomy.   SURGEON:  M. Leda Quail, M.D.   ASSISTANT:  Edwena Felty. Romine, M.D.   ANESTHESIA:  General endotracheal.   FINDINGS:  Enlarged uterus full of clot, boggy and very heavy.   SPECIMENS:  Uterus and cervix to pathology.   ESTIMATED BLOOD LOSS:  700 mL.   URINE OUTPUT:  325 mL of clear urine.   FLUIDS:  2500 mL LR, 1000 mL of Hespan, 2 units packed red blood cells.   INDICATIONS:  Jennifer Sandoval is a very nice 55 year old G3, P2 married white  female who has a history of menorrhagia from submucous fibroid.  She has  bled heavily in the last several months and has gotten down to  hemoglobin of 8 in the clinic.  She was started on progestin therapy to  help with the bleeding and the patient ultimately had worsening bleeding  over the weekend.  She was seen in triage and had a hemoglobin was 7.2.  She was given a unit of blood. She was orthostatics, she was also given  fluid.  She was started on Methergine and she started bleeding again  yesterday.  We had a hysterectomy planned for today, 24  hours later, but  this case was cancelled and as a result, we were able to go ahead and  plan Jennifer Sandoval's hysterectomy.  She does desire this to be minimally  invasive if at all possible.  Therefore, we had posted her for a robotic  assistance was TLH and possible BSO.  The patient has been counseled on  risks and benefits.  She presents today for surgery.   DESCRIPTION OF PROCEDURE:  The patient was taken to the operating room.  She was placed in the supine position.  General endotracheal anesthesia  was administered by the anesthesia staff without difficulty.  Her legs  were positioned in the low lithotomy position in [place] stirrups.  Care  was taken with the arms were tucked to make sure that there is good  support.  The abdomen, thighs, and vagina were prepped in the normal  sterile fashion.  The patient is then draped in a normal sterile  fashion.  Attention was initially turned toward the vagina.  A heavy  weighted speculum was placed in the posterior vagina and a Deaver  retractor was used to visualize the cervix.  The cervix was grasped with  a tenaculum.  Then, the uterus is sounded to 12 cm.  Using dilators, the  cervix was dilated up to #24.  A #10 was attached to the uterine  manipulator and passed through a medium Kohring.  This was advanced in  the cervical os.  Once the Kohring is well around cervix, two stitches  of 0 Vicryl was placed on either side of the cervix to keep it attached  to the Kohring.  The bulb was inflated in the uterus with 5 mL of fluid.  There was a vaginal occluder device that was also present around the  uterine manipulator.  A Foley catheter is inserted in the bladder to  straight drain.  Then, attention is turned toward the abdomen and  sterile gloves are changed.   The decision was made to do an open laparoscopy and first look to see if  there were any adhesions or any reason that would prevent [place] from doing  this procedure laparoscopically.   The uterus was quite mobile and it did  come down well in the vagina, so initially I thought there was a  possibility of proceeding with vaginal hysterectomy or LAVH if  necessary.  Approximately 5 cm above the umbilicus, a 10 mm skin  incision was made.  This was made after 0.25% Marcaine was used to  anesthetize the skin.  The subcu fat tissue was dissected bluntly until  the fascia was visualized.  The fascia was grasped with a single tooth  tenaculum and then incised sharply.  The anterior sheath of the  peritoneum was incised and then the rectus muscles dissected and the  peritoneum was identified and incised sharply.  There appeared to be  some filmy tissue present which I felt was possibly the omentum.  The  Roseanne Reno could be placed through the incision at this point.  Before the  Roseanne Reno was placed, a pursestring of 0 Vicryl was placed on the fascia.  A Roseanne Reno was placed through the incision and the laparoscope was used to  visualize this placement.  It did appear to be either a small omental  adhesion or an opening in the peritoneum.  Using low flow, the  pneumoperitoneum was achieved and this window was easy to visualize and  easy to pass the scope through.  At this point, the pelvis was surveyed.  There was no significant adhesions on the bladder or the uterus,  however, there were omental adhesions of the upper abdomen.  Typical  placement for the robotic ports could not clearly be visualized except  for very far laterally.  This was because of the omental adhesions.   At this point, I did not feel comfortable proceeding with surgery  robotically because I could not pass the trocar supports with good  visualization, they would have had to be passed blindly.  Due to the  adhesions and concern that possible bowel could be present, I decided  this was not to the idea.  At this point, the decision was made to go  ahead and proceed with a laparoscopic assisted vaginal hysterectomy.   The abdominal wall layer was then transilluminated and port placement  sites in the right and left lower quadrant were made.  The skin was  incised with 0.25% Marcaine.  A 5 mm incision was made and a 5  mm non-  bladed trocar supports were passed under direct visualization with the  laparoscope.  The trocars were removed.  The endoscopic grasper and  blunt probe was obtained.  The ovaries were freely mobile.  Again, there  was no significant adhesions.  The uterus was quite boggy but was  mobile.  The decision was made to go ahead and proceed with the LAVH.  The tripolar gyrus was obtained.  Then, the right utero-ovarian ligament  was serially clamped, cauterized and transected using a sound indicator  to ensure that good cauterization was performed.  This was done until  the right IP ligament was completely transected.  The right round  ligament was then transected, keeping close to the uterus.  Then, the  right broad ligament was serially clamped and incised down to the level  of the uterine arteries.  At this point, the anterior peritoneum at the  level of the internal os was well visualized.  This could be lifted with  endoscopic graspers and incised using endoshears.  This was done across  to the left side of the uterus.  Then the uterus was placed on some  stretch to the patient's right and the left IP ligament was serially  clamped, cauterized and transected.  This was done until the left utero-  ovarian pedicle was completely transected.  The left round ligament was  then serially clamped, cauterized and transected and the broad ligament  was clamped, cauterized and transected down to the level of the uterine  arteries.   At this point, the anterior peritoneum was then incised sharply with  endoshears.  Then, keeping close to the cervix at the level of the  internal os, the adhesions that were present around the bladder flap  were taken down sharply so the bladder could be pushed  down the cervix  so that the white glistening tissue of the cervix was well visualized.  At this point, using the tripolar gyrus, the uterine arteries on each  side were serially clamped and cauterized.  They were not transected.  At this point, this portion of the procedure was ended.  The patient's  legs were lifted to the high lithotomy position.  The pneumoperitoneum  was partly released and attention was turned vaginally.   A heavy weighted speculum was placed in the posterior aspect of the  vagina.  The uterine manipulator was removed as well as the Kohring and  the stitches.  The cervix was anesthetized with 1% lidocaine mixed with  1:100,000 epinephrine.  The posterior cul-de-sac was entered sharply.  There was a fair amount of bleeding as the posterior cul-de-sac was  entered.  A stitch was placed across the posterior peritoneum to control  bleeding and then a long heavy weighted speculum was placed in the  vagina.  This was used to help tamponade this bleeding.  Basically, the  cuff bled and oozed throughout the remainder of the procedure until the  cuff was closed.  This was where the majority of blood loss came from.  It just never clotted.  At this point, the uterosacral ligaments were  clamped, transected and suture ligated with 0 Vicryl.  The cervix was  circumferentially incised with cautery.  Attention was turned  anteriorly. Using sharp dissection, the pubovesical cervical fascia was  incised.  Using blunt dissection, a plane between the bladder and the  cervix was identified.  There was some scar tissue here from previous C-  sections.  The cardinal ligaments on  each side were then serially  clamped, transected and suture ligated with 0 Vicryl.  Further  dissection of the pubovesical cervical fascia was performed until the  incision vaginally met with the dissection that had been performed  laparoscopically.  A curved beaver retractor was placed through this  incision  retracting the bladder anteriorly.  Then, the cardinal  ligaments were serially clamped, transected and suture ligated with 0  Vicryl.  At this point, the uterine arteries on each side were serially  clamped, transected, doubly suture ligated with 0 Vicryl.  At this  point, there was only a small pedicle of tissue that was left, however,  the bulkiness of the uterus was preventing the uterus from being  retracted any further.  The cervix was then cored out and Jacobson  tenaculum were placed on either side of the uterus to retract the tissue  medially.  At this point, it is noted that the incisions vaginally had  met with the dissection done laparoscopically on the patient's right  side.  This portion of the uterus could be removed through the vagina  and there was only a small pedicle of tissue on the patient's left side.  This was clamped with hemostats, transected, and suture ligated with -0  Vicryl.   At this point, the open lap tape was placed vaginally to push the bowel  anteriorly.  There was some bleeding, again, from the cuff edge.  The  heavy weighted speculum was removed and the short heavy weighted  speculum was placed back in the vagina.  The cuff was run with a long 0  Vicryl starting at the 2 o'clock position.  The anterior vaginal mucosa  and peritoneum were incorporated in the stitch.  A running interlocking  stitch was used to run the cuff.  This started at the 2 o'clock position  and was brought around to about the 4 o'clock position where the  posterior vaginal mucosa and posterior peritoneum were incorporated and  then back around to about the 10 o'clock position.  At this point, the  cuff did appear hemostatic.  The open lap tape was removed and no  significant bleeding was noted from the sidewalls.  Then, an enterocele  stitch was placed by placing a stitch through the posterior vaginal  mucosa, medial third of the left uterosacral ligament was incorporated.  The  stitch was then reefed across the posterior vaginal mucosa and the  medial third of the right uterosacral ligament was incorporated.  This  was brought back through the vaginal mucosa and tied tightly.  The cuff  was closed with three figure-of-eight sutures of 0 Vicryl.  The lap tape  was removed before the vagina was fully closed.   At this point, attention was turned back abdominally.  A  pneumoperitoneum was re-achieved.  Using a suction irrigator, the  vaginal cuff was irrigated.  There was a small amount of bleeding noted  at the cuff angle and also at the midline where the enterocele stitch  was placed.  These areas were grasped with tripolar cautery and  cauterized.  At this point, excellent hemostasis was noted.  Gelfoam was  placed through the midline port and placed across the cuff.  Photo-  documentation was made.  The utero-ovarian pedicles appeared hemostatic  and the uterine arteries were hemostatic.  At this point, the procedure  was ended.  The two laparoscopic ports inferiorly were removed under  direct visualization of the laparoscope.  The pneumoperitoneum was  released.  The patient was given several deep breaths by the  anesthesiologist and then the midline port was removed.  The pursestring  was tied tightly to reapproximate the fascia on the midline.  Then, the  midline was closed with subcuticular stitch of 3-0 Vicryl and the  anterior incisions were closed with Dermabond.   Sponge, lap, needle, and instrument counts were correct x2.  The patient  tolerated the procedure well and she was awakened from anesthesia and  taken to the recovery room in stable addition.      Lum Keas, MD  Electronically Signed     MSM/MEDQ  D:  02/25/2007  T:  02/25/2007  Job:  [id number]

## 2010-05-16 NOTE — Discharge Summary (Signed)
Jennifer Sandoval, Jennifer Sandoval            ACCOUNT NO.:  [id number]   MEDICAL RECORD NO.:  [id number]          PATIENT TYPE:  OIB   LOCATION:  0164                         FACILITY:  [facility]   PHYSICIAN:  M. Leda Quail, MD  DATE OF BIRTH:  Aug 29, 1955   DATE OF ADMISSION:  02/25/2007  DATE OF DISCHARGE:  02/26/2007                         DISCHARGE SUMMARY - REFERRING   ADMISSION DIAGNOSES:  40. A 55 year old G3, P3 married white female with menorrhagia.  2. Submucous fibroid.  3. Anemia.  4. Status post one unit of transfused packed red cells over this past      weekend.  5. History of gastric bypass.  6. History of C-section x3.  7. History of umbilical hernia repair.   DISCHARGE DIAGNOSES:  71. A 55 year old G3, P3 married white female with menorrhagia.  2. Submucous fibroid.  3. Anemia.  4. Status post one unit of transfused packed red cells over this past      weekend.  5. History of gastric bypass.  6. History of C-section x3.  7. History of umbilical hernia repair.  8. Omental adhesions to the anterior abdominal wall above the      umbilicus most likely secondary to her gastric bypass.   PROCEDURES:  LAVH and transfusion of two units of packed red blood  cells.   HISTORY OF PRESENT ILLNESS:  Jennifer Sandoval is a 55 year old G3, P3 married  white female who has experienced worsening menorrhagia over the past  several months. This has been evaluated with an endometrial biopsy and a  pelvic ultrasound which has shown a 3 cm fibroid that has a submucosal  component to it. Her endometrial biopsy was negative. She was initially  controlled on birth control pills, but these have failed and her  bleeding increased enough to make her anemic. As a result, she has  become symptomatic with the anemia. She was started on high-dose  progesterone agent to try and stop the bleeding before surgery was  planned. However, over this past weekend, she started bleeding more  heavily and was  orthostatic. She came into the emergency room at [facility], and her hemoglobin was in the low 7's. She was given fluids  and transfused one unit of packed red blood cells, and her medication  was changed to Megace. Unfortunately, this has not completely stopped  the bleeding. As a result, we have decided to go ahead and proceed with  her surgery. She has been seen by Dr. Meredeth Ide for several years  but due to some plans Dr. Tresa Res has of being out of the office we felt  it was appropriate to go ahead and schedule the surgery as soon as  possible.   HOSPITAL COURSE:  The patient was admitted through the same day surgery.  She was taken to the operating room where a possible planned da Vinci  robotic-assisted TLH was going to be performed. After placing the scope  in the midline above the umbilicus, it was noted that she had a  significant amount of omental adhesions on the anterior abdominal wall  lateral. As a result, the Trocars could not  be placed with complete  confidence. However, she did have a fairly benign-appearing pelvis with  nil adhesions from her previous three C-sections. As a result, we  decided to do a LAVH as she did have good uterine descensus. The LAVH  laparoscopic portion of the procedure was performed without a vent. Once  the vaginal portion was started the patient had significant bleeding  from almost any tissue that was cut before a suture was placed. Overall,  she had approximately 700 cc of blood loss during the procedure. We  never had any loss of any significant vessels or pedicles during the  procedure. She just had quite a bit of bleeding. She was given two units  of packed red blood cells during this surgery; however, she was  completely stable through this process. Once the vaginal portion was  completed we looked again laparoscopically, and there was a small amount  of bleeding from the cuff. This was cauterized, and excellent hemostasis  was  noted. At this point, the procedure ended, and she was taken to the  recovery room. Some labs were drawn there including a STAT hemoglobin  and hematocrit which showed a hemoglobin of 7.8. PTT/INR were obtained,  and these were normal. Fibrinogen was obtained which was 180. This is  just slightly below normal; because of the blood loss of the surgery and  her cuff being hemostatic at the end of surgery, I  decided to repeat  this the next morning instead of going ahead and giving her FFP. She had  a very stable evening. She was seen in the evening of postoperative day  zero. Vital signs were stable. Pulse was good. She was not orthostatic.  She made excellent urine output. Her Foley was discontinued, and she was  transitioned to a regular diet. She has used a minimal amount of  Dilaudid on her Dilaudid PCA. She had an uneventful evening and night.  In the morning of postoperative day one she looked well and was feeling  well. She had excellent pain control and no nausea. She had good bowel  sounds. Otherwise, her exam was benign. Incisions were clean, dry, and  intact, and her perineum was dry. Postop labs showed a white count of  88, hemoglobin of 7.8, platelet count of 268. Labs also showed  electrolytes of sodium of 136, potassium 4, BUN and creatinine 3 and  0.76 respectively, a glucose of 113, and repeat fibrinogen was normal at  253. At this point, she has met all criteria for discharge, and I feel a  discharge to home is appropriate. She was given a prescription for  Demerol for pain 50 mg one to two tablets q. 3-4 hours p.r.n. pain #30  and no refills. Because of her gastric bypass she really should not do  anti-inflammatories on much basis so she was discouraged from taking  these. She was given instructions to call if she has problems, and these  were provided in the written and verbal form. She will be seen in one  week unless she has any issues, then she would be seen sooner  than that.  At this point, she will be discharged to home later this morning with  her family.      Lum Keas, MD  Electronically Signed     MSM/MEDQ  D:  02/26/2007  T:  02/26/2007  Job:  [id number]

## 2010-05-19 ENCOUNTER — Other Ambulatory Visit: Payer: Self-pay | Admitting: Family Medicine

## 2010-05-22 ENCOUNTER — Telehealth: Payer: Self-pay | Admitting: Gastroenterology

## 2010-05-22 ENCOUNTER — Telehealth: Payer: Self-pay

## 2010-05-22 NOTE — Telephone Encounter (Addendum)
Jennifer Sandoval, [place] 05/22/2010 8:38 AM Signed  Pt called stating that since she had her colonoscopy she hasn't went to the bathroom in 2 weeks (on Wed). Pt states she is in pain, bloated, and vomited yesterday. Pt states her sister is a Engineer, civil (consulting) and is afraid she has a bowel obstruction. Per MD I advised pt she needs to go to ER or contact the GI office that did her colonoscopy. Pt stated she understood  Pt had normal COLON 05/10/10. She reports she took Colace a couple of nights. Yesterday she tried 2 enemas with no relief. She has sharp pains on and off and feels bloated. She can't eat that much and last pm she threw up.  Please advise.

## 2010-05-22 NOTE — Telephone Encounter (Signed)
Pt called stating that since she had her colonoscopy she hasn't went to the bathroom in 2 weeks (on Wed). Pt states she is in pain, bloated, and vomited yesterday. Pt states her sister is a Engineer, civil (consulting) and is afraid she has a bowel obstruction. Per MD I advised pt she needs to go to ER or contact the GI office that did her colonoscopy. Pt stated she understood

## 2010-05-22 NOTE — Telephone Encounter (Signed)
Spoke with Mike Gip, PA, who cannot work pt in today. Amy suggested pt take 3-4 doses of Miralax today, and if no BM, repeat tomorrow- but call us. If pt wants, she may take Mag Citrate. Call us tomorrow to let us know how she is doing.  Pt stated she's afraid to take the Mag Citrate if she has a blockage. She stated she's had Gastric Bypass and 3 children and she has a lot of scar tissue. She read her COLON report about her tortuous and redundant colon and feels that is from the adhesions. After talking about it, pt decided she will try the Miralax since it is more gentle. We talked about possible impaction and how to check for that as well. Pt is just frustrated because her PCP told her to call us and then we can't see her for a couple of days- I had suggested she go to the ER. Pt will try the Miralax and she will call back tomorrow.

## 2010-05-22 NOTE — Telephone Encounter (Signed)
SEE PA

## 2010-06-30 ENCOUNTER — Encounter: Payer: Self-pay | Admitting: Family Medicine

## 2010-06-30 ENCOUNTER — Ambulatory Visit (INDEPENDENT_AMBULATORY_CARE_PROVIDER_SITE_OTHER): Payer: BC Managed Care – PPO | Admitting: Family Medicine

## 2010-06-30 DIAGNOSIS — J329 Chronic sinusitis, unspecified: Secondary | ICD-10-CM

## 2010-06-30 DIAGNOSIS — R1013 Epigastric pain: Secondary | ICD-10-CM

## 2010-06-30 DIAGNOSIS — J019 Acute sinusitis, unspecified: Secondary | ICD-10-CM

## 2010-06-30 DIAGNOSIS — F329 Major depressive disorder, single episode, unspecified: Secondary | ICD-10-CM

## 2010-06-30 DIAGNOSIS — F3289 Other specified depressive episodes: Secondary | ICD-10-CM

## 2010-06-30 DIAGNOSIS — K3189 Other diseases of stomach and duodenum: Secondary | ICD-10-CM

## 2010-06-30 DIAGNOSIS — J309 Allergic rhinitis, unspecified: Secondary | ICD-10-CM

## 2010-06-30 DIAGNOSIS — Z889 Allergy status to unspecified drugs, medicaments and biological substances status: Secondary | ICD-10-CM

## 2010-06-30 MED ORDER — MONTELUKAST SODIUM 10 MG PO TABS: 10.0000 mg | ORAL_TABLET | Freq: Every day | ORAL | Status: DC

## 2010-06-30 MED ORDER — LEVOFLOXACIN 500 MG PO TABS: 500.0000 mg | ORAL_TABLET | Freq: Every day | ORAL | Status: DC

## 2010-06-30 MED ORDER — CETIRIZINE HCL 10 MG PO TABS: ORAL_TABLET | ORAL | Status: DC

## 2010-06-30 MED ORDER — GUAIFENESIN ER 600 MG PO TB12: ORAL_TABLET | ORAL | Status: AC

## 2010-06-30 NOTE — Patient Instructions (Signed)

## 2010-07-01 ENCOUNTER — Encounter: Payer: Self-pay | Admitting: Family Medicine

## 2010-07-01 NOTE — Assessment & Plan Note (Signed)
Doing well on Pristiq willcontinue the same

## 2010-07-01 NOTE — Assessment & Plan Note (Signed)
Recurrent and flared over past 2 weeks. Wil treat with Mucinex and Levaquin for 14 days and she will report if symptoms worsen or do not improve

## 2010-07-01 NOTE — Progress Notes (Signed)
Jennifer Sandoval [id number] 1955-05-26 07/01/2010      Progress Note-Follow Up  Subjective  Chief Complaint  Chief Complaint  Patient presents with  . Sinus Problem    HPI  Patient is a 55 year old Caucasian female who is in today for a further signs this. 2 weeks ago she was unloading bales of hay and felt her allergies flare up with itching, sneezing, increased congestion and eye symptoms. Since that time she developed worsening postnasal drip increased sinus pressure, green sputum and malaise. She denies chest pain or palpitations, significant cough, wheezing, shortness of breath, GI or GU complaints. She does get occasional tickle in her throat with the cough was nonproductive and not spasmodic. Her bowels are not infected and they are somewhat improved. Decrease reflux symptoms on her current medications. She is happy with her response to Pristiq and we will continue this at this time  Past Medical History  Diagnosis Date  . Allergy     rhinitis  . Anemia     iron deficeincy  . Depression   . Lumbar compression fracture     nonsurgical  . SHOULDER PAIN, BILATERAL 10/10/2007  . PERIMENOPAUSAL STATUS 03/16/2010  . OSTEOARTHRITIS 10/24/2006  . Leiomyoma of uterus, unspecified 02/15/2010  . FRACTURE, NOSE 10/22/2007  . DEPRESSION 10/24/2006  . BASAL CELL CARCINOMA SKIN LOWER LIMB INCL HIP 02/15/2010  . ANEMIA-NOS 10/24/2006  . ALLERGIC RHINITIS 08/15/2006  . Dyspepsia 04/19/2010    Past Surgical History  Procedure Date  . Septoplasty   . Gastric bypass   . Tubal ligation   . Nasal sinus surgery   . Cesarean section     X 3  . Appendectomy   . Cholecystectomy   . Ventral herniorrhaphy     X 2  . Abdominal hysterectomy 2009    partial, ovaries left in place, for painful, heavy menstrrual bleeding secondary to anemia and fibroids  . Skin biopsy     L hip BCC, facial lesions were benign    Family History  Problem Relation Age of Onset  . Cholelithiasis Mother   .  Other Mother     Urinary incontince/ bladder prolapse/ h/o smoking  . Cancer Mother     laryngeal carcinoma  . Allergies Mother   . Other Father     Renal failure  . Hypertension Father   . Coronary artery disease Father     s/p bypass  . Aortic aneurysm Father   . Hyperlipidemia Father   . Heart disease Father   . Spina bifida Brother     with stunt/ self cath  . Seizures Brother     disorders  . ADD / ADHD Daughter   . Cancer Maternal Grandmother     colon/ smoker  . Cancer Maternal Grandfather     lung  . Cancer Paternal Grandmother     Breast  . Heart disease Paternal Grandfather   . Stroke Paternal Grandfather   . ADD / ADHD Daughter     History   Social History  . Marital Status: Married    Spouse Name: N/A    Number of Children: N/A  . Years of Education: N/A   Occupational History  . Not on file.   Social History Main Topics  . Smoking status: Never Smoker   . Smokeless tobacco: Never Used  . Alcohol Use: Yes     special occasion  . Drug Use: No  . Sexually Active: Yes -- Female partner(s)   Other Topics Concern  .  Not on file   Social History Narrative  . No narrative on file    Current Outpatient Prescriptions on File Prior to Visit  Medication Sig Dispense Refill  . desvenlafaxine (PRISTIQ) 50 MG 24 hr tablet Take 50 mg by mouth daily.        . fluticasone (FLONASE) 50 MCG/ACT nasal spray 2 sprays by Nasal route daily as needed. For allergies       . ranitidine (ZANTAC) 300 MG tablet TAKE 1 TABLET BY MOUTH EVERY DAY AT BEDTIME  30 tablet  1  . vitamin B-12 (CYANOCOBALAMIN) 1000 MCG tablet Take 1,000 mcg by mouth daily.        Marland Kitchen VITAMIN D, CHOLECALCIFEROL, PO Take 1 tablet by mouth daily.        Marland Kitchen zolpidem (AMBIEN) 10 MG tablet Take 1 tablet (10 mg total) by mouth at bedtime as needed.  30 tablet  2   Current Facility-Administered Medications on File Prior to Visit  Medication Dose Route Frequency Provider Last Rate Last Dose  . 0.9 %  sodium  chloride infusion  500 mL Intravenous Continuous Sheryn Bison, MD        Allergies  Allergen Reactions  . Itraconazole     REACTION: Rash    Review of Systems  Review of Systems  Constitutional: Negative for fever and malaise/fatigue.  HENT: Positive for congestion and sore throat. Negative for ear pain.        PND  Eyes: Negative for discharge.  Respiratory: Negative for shortness of breath and wheezing.   Cardiovascular: Negative for chest pain, palpitations and leg swelling.  Gastrointestinal: Negative for nausea, abdominal pain and diarrhea.  Genitourinary: Negative for dysuria.  Musculoskeletal: Negative for falls.  Skin: Negative for rash.  Neurological: Negative for loss of consciousness and headaches.  Endo/Heme/Allergies: Negative for polydipsia.  Psychiatric/Behavioral: Negative for depression and suicidal ideas. The patient is not nervous/anxious and does not have insomnia.     Objective  BP 117/79  Pulse 91  Temp(Src) 97.8 F (36.6 C) (Oral)  Ht 5\' 2"  (1.575 m)  Wt 140 lb 1.9 oz (63.558 kg)  BMI 25.63 kg/m2  SpO2 100%  Physical Exam  Physical Exam  Constitutional: She is oriented to person, place, and time and well-developed, well-nourished, and in no distress. No distress.  HENT:  Head: Normocephalic and atraumatic.  Eyes: Conjunctivae are normal. Right eye exhibits no discharge. Left eye exhibits no discharge.  Neck: Neck supple. No thyromegaly present.       Nasal mucosa boggy and erythematous  Cardiovascular: Normal rate, regular rhythm and normal heart sounds.   No murmur heard. Pulmonary/Chest: Effort normal and breath sounds normal. She has no wheezes.  Abdominal: She exhibits no distension and no mass.  Musculoskeletal: She exhibits no edema.  Lymphadenopathy:    She has no cervical adenopathy.  Neurological: She is alert and oriented to person, place, and time.  Skin: Skin is warm and dry. No rash noted. She is not diaphoretic.    Psychiatric: Memory, affect and judgment normal.    Lab Results  Component Value Date   TSH 1.08 10/08/2006   Lab Results  Component Value Date   WBC 7.8 03/30/2010   HGB 11.0* 03/30/2010   HCT 33.9* 03/30/2010   MCV 83.0 03/30/2010   PLT 290.0 03/30/2010   Lab Results  Component Value Date   CREATININE 0.8 03/30/2010   BUN 14 03/30/2010   NA 141 03/30/2010   K 4.0 03/30/2010   CL  105 03/30/2010   CO2 31 03/30/2010   Lab Results  Component Value Date   ALT 10 03/30/2010   AST 20 03/30/2010   ALKPHOS 92 03/30/2010   BILITOT 0.2* 03/30/2010   Lab Results  Component Value Date   CHOL 124 10/08/2006   Lab Results  Component Value Date   HDL 50.7 10/08/2006   Lab Results  Component Value Date   LDLCALC 49 10/08/2006   Lab Results  Component Value Date   TRIG 122 10/08/2006   Lab Results  Component Value Date   CHOLHDL 2.4 CALC 10/08/2006     Assessment & Plan  SINUSITIS Recurrent and flared over past 2 weeks. Wil treat with Mucinex and Levaquin for 14 days and she will report if symptoms worsen or do not improve  DEPRESSION Doing well on Pristiq willcontinue the same  Dyspepsia Improved on current regimen, continue the same  ALLERGIC RHINITIS May continue OTC nonsedating antihistamines and may use bid on a bad day, continue Fluticasone and given a pack of one week of Singulair 10mg  tabs to try 1 daily for next week to see if it helps her congestion. She may call for an rx if it does

## 2010-07-01 NOTE — Assessment & Plan Note (Signed)
May continue OTC nonsedating antihistamines and may use bid on a bad day, continue Fluticasone and given a pack of one week of Singulair 10mg  tabs to try 1 daily for next week to see if it helps her congestion. She may call for an rx if it does

## 2010-07-01 NOTE — Assessment & Plan Note (Signed)
Improved on current regimen, continue the same

## 2010-07-05 ENCOUNTER — Other Ambulatory Visit: Payer: Self-pay | Admitting: Family Medicine

## 2010-07-05 DIAGNOSIS — F3289 Other specified depressive episodes: Secondary | ICD-10-CM

## 2010-07-05 DIAGNOSIS — F329 Major depressive disorder, single episode, unspecified: Secondary | ICD-10-CM

## 2010-07-06 ENCOUNTER — Telehealth: Payer: Self-pay | Admitting: Emergency Medicine

## 2010-07-06 DIAGNOSIS — F3289 Other specified depressive episodes: Secondary | ICD-10-CM

## 2010-07-06 DIAGNOSIS — F329 Major depressive disorder, single episode, unspecified: Secondary | ICD-10-CM

## 2010-07-06 NOTE — Telephone Encounter (Signed)
Per LOV 6/29, continue all meds

## 2010-07-06 NOTE — Telephone Encounter (Signed)
PC to [employer] for PA.  Per Joni Reining their system is down and they are unable to take calls at this time.  I called the patient to let her know that I will leave 7 days worth of samples at front desk that she can come pick up as we will not have an answer today.  She is agreeable.  She states she already has discount card on file with pharmacy.  Advised pt I will continue to work on this, but it will most likely be tomorrow before we have an answer.

## 2010-07-06 NOTE — Telephone Encounter (Signed)
Patient's med for Pristeq needs to be precerted . Please call patient when you are done. She needs her medication today.

## 2010-07-07 NOTE — Telephone Encounter (Signed)
Spoke to [employer] at [facility].  Form was completed and faxed back.

## 2010-07-10 NOTE — Telephone Encounter (Signed)
PA approved. Pharmacy notified 

## 2010-07-19 ENCOUNTER — Ambulatory Visit: Payer: Commercial Indemnity | Admitting: Family Medicine

## 2010-08-16 ENCOUNTER — Encounter: Payer: Self-pay | Admitting: Family Medicine

## 2010-09-13 ENCOUNTER — Other Ambulatory Visit: Payer: Self-pay

## 2010-09-13 DIAGNOSIS — G4700 Insomnia, unspecified: Secondary | ICD-10-CM

## 2010-09-13 MED ORDER — ZOLPIDEM TARTRATE 10 MG PO TABS: 10.0000 mg | ORAL_TABLET | Freq: Every evening | ORAL | Status: DC | PRN

## 2010-09-20 ENCOUNTER — Ambulatory Visit (INDEPENDENT_AMBULATORY_CARE_PROVIDER_SITE_OTHER): Payer: BC Managed Care – PPO | Admitting: Family Medicine

## 2010-09-20 ENCOUNTER — Encounter: Payer: Self-pay | Admitting: Family Medicine

## 2010-09-20 DIAGNOSIS — J309 Allergic rhinitis, unspecified: Secondary | ICD-10-CM

## 2010-09-20 DIAGNOSIS — Z9109 Other allergy status, other than to drugs and biological substances: Secondary | ICD-10-CM

## 2010-09-20 DIAGNOSIS — Z889 Allergy status to unspecified drugs, medicaments and biological substances status: Secondary | ICD-10-CM

## 2010-09-20 DIAGNOSIS — J019 Acute sinusitis, unspecified: Secondary | ICD-10-CM

## 2010-09-20 DIAGNOSIS — J329 Chronic sinusitis, unspecified: Secondary | ICD-10-CM

## 2010-09-20 MED ORDER — FLUTICASONE PROPIONATE 50 MCG/ACT NA SUSP: 1.0000 | Freq: Every day | NASAL | Status: DC | PRN

## 2010-09-20 MED ORDER — LEVOFLOXACIN 500 MG PO TABS: 500.0000 mg | ORAL_TABLET | Freq: Every day | ORAL | Status: DC

## 2010-09-20 MED ORDER — CETIRIZINE HCL 10 MG PO TABS: ORAL_TABLET | ORAL | Status: DC

## 2010-09-20 MED ORDER — GUAIFENESIN ER 600 MG PO TB12: 600.0000 mg | ORAL_TABLET | Freq: Two times a day (BID) | ORAL | Status: AC

## 2010-09-20 NOTE — Progress Notes (Signed)
Jennifer Sandoval [id number] [birth year] 09/20/2010      Progress Note-Follow Up  Subjective  Chief Complaint  Chief Complaint  Patient presents with  . Sinusitis    X 1 1/2 week    HPI  Patient is a 55 year old Caucasian female who is in today with complaints of sinus pressure. She has been feeling poorly for the last 1-2 weeks but symptoms are worsening and she is concerned because she is leaving town this weekend. She struggles with allergies and has been working a lot and very dusty environments and initially thought her symptoms were related to this. Despite that fact she has not been taking her Zyrtec and her Flonase regularly. Over the last several days she's had increased sinus pressure, nasal congestion, low-grade cough, green rhinorrhea and malaise. No obvious high-grade fevers or chills no chest pain, palpitations, shortness of breath, GI or GU complaints noted today.  Past Medical History  Diagnosis Date  . Allergy     rhinitis  . Anemia     iron deficeincy  . Depression   . Lumbar compression fracture     nonsurgical  . SHOULDER PAIN, BILATERAL 10/10/2007  . PERIMENOPAUSAL STATUS 03/16/2010  . OSTEOARTHRITIS 10/24/2006  . Leiomyoma of uterus, unspecified 02/15/2010  . FRACTURE, NOSE 10/22/2007  . DEPRESSION 10/24/2006  . BASAL CELL CARCINOMA SKIN LOWER LIMB INCL HIP 02/15/2010  . ANEMIA-NOS 10/24/2006  . ALLERGIC RHINITIS 08/15/2006  . Dyspepsia 04/19/2010    Past Surgical History  Procedure Date  . Septoplasty   . Gastric bypass   . Tubal ligation   . Nasal sinus surgery   . Cesarean section     X 3  . Appendectomy   . Cholecystectomy   . Ventral herniorrhaphy     X 2  . Abdominal hysterectomy 2009    partial, ovaries left in place, for painful, heavy menstrrual bleeding secondary to anemia and fibroids  . Skin biopsy     L hip BCC, facial lesions were benign    Family History  Problem Relation Age of Onset  . Cholelithiasis Mother   . Other Mother       Urinary incontince/ bladder prolapse/ h/o smoking  . Cancer Mother     laryngeal carcinoma  . Allergies Mother   . Other Father     Renal failure  . Hypertension Father   . Coronary artery disease Father     s/p bypass  . Aortic aneurysm Father   . Hyperlipidemia Father   . Heart disease Father   . Spina bifida Brother     with stunt/ self cath  . Seizures Brother     disorders  . ADD / ADHD Daughter   . Cancer Maternal Grandmother     colon/ smoker  . Cancer Maternal Grandfather     lung  . Cancer Paternal Grandmother     Breast  . Heart disease Paternal Grandfather   . Stroke Paternal Grandfather   . ADD / ADHD Daughter     History   Social History  . Marital Status: Married    Spouse Name: N/A    Number of Children: N/A  . Years of Education: N/A   Occupational History  . Not on file.   Social History Main Topics  . Smoking status: Never Smoker   . Smokeless tobacco: Never Used  . Alcohol Use: Yes     special occasion  . Drug Use: No  . Sexually Active: Yes -- Female partner(s)  Other Topics Concern  . Not on file   Social History Narrative  . No narrative on file    Current Outpatient Prescriptions on File Prior to Visit  Medication Sig Dispense Refill  . montelukast (SINGULAIR) 10 MG tablet Take 1 tablet (10 mg total) by mouth daily.  7 tablet  0  . PRISTIQ 50 MG 24 hr tablet TAKE 1 TABLET BY MOUTH EVERY DAY  30 tablet  2  . vitamin B-12 (CYANOCOBALAMIN) 1000 MCG tablet Take 1,000 mcg by mouth daily.        Marland Kitchen VITAMIN D, CHOLECALCIFEROL, PO Take 1 tablet by mouth daily.        Marland Kitchen zolpidem (AMBIEN) 10 MG tablet Take 1 tablet (10 mg total) by mouth at bedtime as needed.  30 tablet  2   Current Facility-Administered Medications on File Prior to Visit  Medication Dose Route Frequency Provider Last Rate Last Dose  . 0.9 %  sodium chloride infusion  500 mL Intravenous Continuous Sheryn Bison, MD        Allergies  Allergen Reactions  .  Itraconazole     REACTION: Rash    Review of Systems  Review of Systems  Constitutional: Negative for fever and malaise/fatigue.  HENT: Positive for congestion. Negative for ear pain.        Facial pressure b/l  Eyes: Negative for discharge.  Respiratory: Positive for cough and sputum production. Negative for shortness of breath and wheezing.   Cardiovascular: Negative for chest pain, palpitations and leg swelling.  Gastrointestinal: Negative for nausea, abdominal pain and diarrhea.  Genitourinary: Negative for dysuria.  Musculoskeletal: Negative for myalgias and falls.  Skin: Negative for rash.  Neurological: Positive for headaches. Negative for loss of consciousness.  Endo/Heme/Allergies: Negative for polydipsia.  Psychiatric/Behavioral: Negative for depression and suicidal ideas. The patient is not nervous/anxious and does not have insomnia.     Objective  BP 125/83  Pulse 100  Temp(Src) 98.3 F (36.8 C) (Oral)  Ht 5\' 2"  (1.575 m)  Wt 140 lb 12.8 oz (63.866 kg)  BMI 25.75 kg/m2  SpO2 99%  Physical Exam  Physical Exam  Constitutional: She is oriented to person, place, and time and well-developed, well-nourished, and in no distress. No distress.  HENT:  Head: Normocephalic and atraumatic.       Nasal mucosa boggy and erythematous. R>L  Eyes: Conjunctivae are normal.  Neck: Neck supple. No thyromegaly present.       Right side posterior auricular lymphadenopathy noted  Cardiovascular: Normal rate, regular rhythm and normal heart sounds.   No murmur heard. Pulmonary/Chest: Effort normal and breath sounds normal. She has no wheezes.  Abdominal: She exhibits no distension and no mass.  Musculoskeletal: She exhibits no edema.  Lymphadenopathy:    She has cervical adenopathy.  Neurological: She is alert and oriented to person, place, and time.  Skin: Skin is warm and dry. No rash noted. She is not diaphoretic.  Psychiatric: Memory, affect and judgment normal.    Lab  Results  Component Value Date   TSH 1.08 10/08/2006   Lab Results  Component Value Date   WBC 7.8 03/30/2010   HGB 11.0* 03/30/2010   HCT 33.9* 03/30/2010   MCV 83.0 03/30/2010   PLT 290.0 03/30/2010   Lab Results  Component Value Date   CREATININE 0.8 03/30/2010   BUN 14 03/30/2010   NA 141 03/30/2010   K 4.0 03/30/2010   CL 105 03/30/2010   CO2 31 03/30/2010   Lab  Results  Component Value Date   ALT 10 03/30/2010   AST 20 03/30/2010   ALKPHOS 92 03/30/2010   BILITOT 0.2* 03/30/2010   Lab Results  Component Value Date   CHOL 124 10/08/2006   Lab Results  Component Value Date   HDL 50.7 10/08/2006   Lab Results  Component Value Date   LDLCALC 49 10/08/2006   Lab Results  Component Value Date   TRIG 122 10/08/2006   Lab Results  Component Value Date   CHOLHDL 2.4 CALC 10/08/2006     Assessment & Plan  SINUSITIS Symptoms worsened over past 1-2 weeks, Levaquin 500mg  po daily x 14 days, Mucinex twice a day for next 2 weeks. Increase fluids to at least 64 oz of clear fluids daily  ALLERGIC RHINITIS Encouraged to increase Cetirizine 10mg  po to bid and restart the Flonase

## 2010-09-20 NOTE — Assessment & Plan Note (Signed)
Encouraged to increase Cetirizine 10mg  po to bid and restart the Flonase

## 2010-09-20 NOTE — Patient Instructions (Signed)

## 2010-09-20 NOTE — Assessment & Plan Note (Addendum)
Symptoms worsened over past 1-2 weeks, Levaquin 500mg  po daily x 14 days, Mucinex twice a day for next 2 weeks. Increase fluids to at least 64 oz of clear fluids daily

## 2010-09-22 LAB — COMPREHENSIVE METABOLIC PANEL
Albumin: 3 — ABNORMAL LOW
Alkaline Phosphatase: 57
BUN: 10
CO2: 26
Chloride: 110
Creatinine, Ser: 0.68
GFR calc non Af Amer: >60
Glucose, Bld: 81
Potassium: 3.8
Total Bilirubin: 0.2 — ABNORMAL LOW

## 2010-09-22 LAB — TYPE AND SCREEN
ABO/RH(D): O POS
Antibody Screen: NEGATIVE

## 2010-09-22 LAB — FIBRINOGEN: Fibrinogen: 180 — ABNORMAL LOW

## 2010-09-22 LAB — BASIC METABOLIC PANEL
CO2: 26
Calcium: 7.7 — ABNORMAL LOW
GFR calc Af Amer: >60
GFR calc non Af Amer: >60
Glucose, Bld: 113 — ABNORMAL HIGH
Potassium: 4
Sodium: 136

## 2010-09-22 LAB — CBC
HCT: 23.2 — ABNORMAL LOW
Hemoglobin: 7.8 — CL
MCHC: 34.2
RBC: 2.95 — ABNORMAL LOW
RDW: 17.8 — ABNORMAL HIGH

## 2010-09-22 LAB — ABO/RH: ABO/RH(D): O POS

## 2010-09-22 LAB — HEMOGLOBIN AND HEMATOCRIT, BLOOD
HCT: 22.6 — ABNORMAL LOW
Hemoglobin: 7.4 — CL

## 2010-09-22 LAB — PROTIME-INR: Prothrombin Time: 14.6

## 2010-09-25 LAB — CROSSMATCH: Antibody Screen: NEGATIVE

## 2010-09-25 LAB — CBC
HCT: 21.8 — ABNORMAL LOW
HCT: 22.5 — ABNORMAL LOW
HCT: 25.5 — ABNORMAL LOW
MCHC: 32.7
MCHC: 32.9
MCV: 74.9 — ABNORMAL LOW
MCV: 75.2 — ABNORMAL LOW
Platelets: 284
Platelets: 326
RBC: 3.4 — ABNORMAL LOW
RDW: 14.9
RDW: 16.2 — ABNORMAL HIGH
WBC: 8.5
WBC: 8.8

## 2010-10-08 ENCOUNTER — Other Ambulatory Visit: Payer: Self-pay | Admitting: Family Medicine

## 2010-10-09 ENCOUNTER — Telehealth: Payer: Self-pay | Admitting: Family Medicine

## 2010-10-09 NOTE — Telephone Encounter (Signed)
Pt needs refill of Pristiq, she runs out tomorrow

## 2010-10-10 NOTE — Telephone Encounter (Signed)
Patient called back & said thank you to [facility] for filling Rx promptly

## 2010-11-06 ENCOUNTER — Encounter: Payer: Self-pay | Admitting: Family Medicine

## 2010-11-06 ENCOUNTER — Ambulatory Visit (INDEPENDENT_AMBULATORY_CARE_PROVIDER_SITE_OTHER): Payer: BC Managed Care – PPO | Admitting: Family Medicine

## 2010-11-06 VITALS — BP 133/83 | HR 107 | Temp 98.7°F | Ht 62.0 in | Wt 140.8 lb

## 2010-11-06 DIAGNOSIS — F3289 Other specified depressive episodes: Secondary | ICD-10-CM

## 2010-11-06 DIAGNOSIS — J329 Chronic sinusitis, unspecified: Secondary | ICD-10-CM

## 2010-11-06 DIAGNOSIS — F418 Other specified anxiety disorders: Secondary | ICD-10-CM

## 2010-11-06 DIAGNOSIS — F341 Dysthymic disorder: Secondary | ICD-10-CM

## 2010-11-06 DIAGNOSIS — F329 Major depressive disorder, single episode, unspecified: Secondary | ICD-10-CM

## 2010-11-06 MED ORDER — LORAZEPAM 0.5 MG PO TABS: 0.5000 mg | ORAL_TABLET | Freq: Two times a day (BID) | ORAL | Status: DC | PRN

## 2010-11-06 MED ORDER — DESVENLAFAXINE SUCCINATE ER 50 MG PO TB24: ORAL_TABLET | ORAL | Status: DC

## 2010-11-06 MED ORDER — SERTRALINE HCL 50 MG PO TABS: ORAL_TABLET | ORAL | Status: DC

## 2010-11-06 NOTE — Patient Instructions (Signed)
Depression      Depression is a strong emotion of feeling unhappy that can last for weeks, months, or even longer. Depression causes problems with the ability to function in life. It upsets your:   · Relationships.   · Sleep.   · Eating habits.   · Work habits.   HOME CARE  · Take all medicine as told by your doctor.   · Talk with a therapist, counselor, or friend.   · Eat a healthy diet.   · Exercise regularly.   · Do not drink alcohol or use drugs.   GET HELP RIGHT AWAY IF:  You start to have thoughts about hurting yourself or others.  MAKE SURE YOU:  · Understand these instructions.   · Will watch your condition.   · Will get help right away if you are not doing well or get worse.   Document Released: 01/20/2010 Document Revised: 08/30/2010 Document Reviewed: 01/20/2010  [employer]® Patient Information ©2012 [employer].

## 2010-11-07 NOTE — Assessment & Plan Note (Signed)
Patient initially felt that her depression had improved somewhat on the Pristiq but now is more concerned about her increasing irritability and a worsening of her depression. She feels she was more even tempered on the Zoloft. We will return to the zoloft and titrate off the Pristiq. She will decrease Pristiq to QOD that Q every 3 rd day then stop. At the same time she will be increasing her Sertraline to 100mg  over the next couple of weeks. May use Lorazepam prn for irritability while we make this adjustment

## 2010-11-07 NOTE — Progress Notes (Signed)
Jennifer Sandoval [id number] 10-Feb-1955 11/07/2010      Progress Note-Follow Up  Subjective  Chief Complaint  Chief Complaint  Patient presents with  . change medication    Pristiq    HPI  Patient is a 55-year-old Caucasian female who is in today to discuss her Pristiq. When she first switched her to collect it was helping her depression personally in the recent past she's begun before that her depression is worsening once again. She is noting increased irritability and difficulty concentrating as well anhedonia. She reports feeling more even on Zoloft and is considering switching back. No GI upset and no other recent illness. She denies suicidal ideation. Her recent treatment of sinusitis has been helpful and she is not complaining of any fevers, chills, congestion, chest pain, palpitations, shortness of breath, GI or GU concerns at this time.  Past Medical History  Diagnosis Date  . Allergy     rhinitis  . Anemia     iron deficeincy  . Depression   . Lumbar compression fracture     nonsurgical  . SHOULDER PAIN, BILATERAL 10/10/2007  . PERIMENOPAUSAL STATUS 03/16/2010  . OSTEOARTHRITIS 10/24/2006  . Leiomyoma of uterus, unspecified 02/15/2010  . FRACTURE, NOSE 10/22/2007  . DEPRESSION 10/24/2006  . BASAL CELL CARCINOMA SKIN LOWER LIMB INCL HIP 02/15/2010  . ANEMIA-NOS 10/24/2006  . ALLERGIC RHINITIS 08/15/2006  . Dyspepsia 04/19/2010    Past Surgical History  Procedure Date  . Septoplasty   . Gastric bypass   . Tubal ligation   . Nasal sinus surgery   . Cesarean section     X 3  . Appendectomy   . Cholecystectomy   . Ventral herniorrhaphy     X 2  . Abdominal hysterectomy 2009    partial, ovaries left in place, for painful, heavy menstrrual bleeding secondary to anemia and fibroids  . Skin biopsy     L hip BCC, facial lesions were benign    Family History  Problem Relation Age of Onset  . Cholelithiasis Mother   . Other Mother     Urinary incontince/ bladder  prolapse/ h/o smoking  . Cancer Mother     laryngeal carcinoma  . Allergies Mother   . Other Father     Renal failure  . Hypertension Father   . Coronary artery disease Father     s/p bypass  . Aortic aneurysm Father   . Hyperlipidemia Father   . Heart disease Father   . Spina bifida Brother     with stunt/ self cath  . Seizures Brother     disorders  . ADD / ADHD Daughter   . Cancer Maternal Grandmother     colon/ smoker  . Cancer Maternal Grandfather     lung  . Cancer Paternal Grandmother     Breast  . Heart disease Paternal Grandfather   . Stroke Paternal Grandfather   . ADD / ADHD Daughter     History   Social History  . Marital Status: Married    Spouse Name: N/A    Number of Children: N/A  . Years of Education: N/A   Occupational History  . Not on file.   Social History Main Topics  . Smoking status: Never Smoker   . Smokeless tobacco: Never Used  . Alcohol Use: Yes     special occasion  . Drug Use: No  . Sexually Active: Yes -- Female partner(s)   Other Topics Concern  . Not on file  Social History Narrative  . No narrative on file    Current Outpatient Prescriptions on File Prior to Visit  Medication Sig Dispense Refill  . cetirizine (ZYRTEC) 10 MG tablet 1 tab daily, may take a second any given day as needed allergies  60 tablet  1  . desvenlafaxine (PRISTIQ) 50 MG 24 hr tablet 1 tab po every other day x 2 weeks then drop to 1 tab po every third day for 2 weeks then stop  30 tablet  0  . fluticasone (FLONASE) 50 MCG/ACT nasal spray Place 1 spray into the nose daily as needed for rhinitis or allergies (1 spray each nostril daily). For allergies  16 g  3  . vitamin B-12 (CYANOCOBALAMIN) 1000 MCG tablet Take 1,000 mcg by mouth daily.        Marland Kitchen VITAMIN D, CHOLECALCIFEROL, PO Take 1 tablet by mouth daily.        Marland Kitchen zolpidem (AMBIEN) 10 MG tablet Take 1 tablet (10 mg total) by mouth at bedtime as needed.  30 tablet  2  . guaiFENesin (MUCINEX) 600 MG 12  hr tablet Take 1 tablet (600 mg total) by mouth 2 (two) times daily.  20 tablet  1   Current Facility-Administered Medications on File Prior to Visit  Medication Dose Route Frequency Provider Last Rate Last Dose  . 0.9 %  sodium chloride infusion  500 mL Intravenous Continuous Sheryn Bison, MD        Allergies  Allergen Reactions  . Itraconazole     REACTION: Rash    Review of Systems  Review of Systems  Constitutional: Negative for fever and malaise/fatigue.  HENT: Negative for congestion.   Eyes: Negative for discharge.  Respiratory: Negative for cough and shortness of breath.   Cardiovascular: Negative for chest pain, palpitations and leg swelling.  Gastrointestinal: Negative for nausea, abdominal pain and diarrhea.  Genitourinary: Negative for dysuria.  Musculoskeletal: Negative for falls.  Skin: Negative for rash.  Neurological: Negative for loss of consciousness and headaches.  Endo/Heme/Allergies: Negative for polydipsia.  Psychiatric/Behavioral: Positive for depression. Negative for suicidal ideas, hallucinations and substance abuse. The patient is nervous/anxious. The patient does not have insomnia.     Objective  BP 133/83  Pulse 107  Temp(Src) 98.7 F (37.1 C) (Oral)  Ht 5\' 2"  (1.575 m)  Wt 140 lb 12.8 oz (63.866 kg)  BMI 25.75 kg/m2  SpO2 99%  Physical Exam  Physical Exam  Constitutional: She is oriented to person, place, and time and well-developed, well-nourished, and in no distress. No distress.  HENT:  Head: Normocephalic and atraumatic.  Eyes: Conjunctivae are normal.  Neck: Neck supple. No thyromegaly present.  Cardiovascular: Normal rate, regular rhythm and normal heart sounds.   No murmur heard. Pulmonary/Chest: Effort normal and breath sounds normal. She has no wheezes.  Abdominal: She exhibits no distension and no mass.  Musculoskeletal: She exhibits no edema.  Lymphadenopathy:    She has no cervical adenopathy.  Neurological: She is  alert and oriented to person, place, and time.  Skin: Skin is warm and dry. No rash noted. She is not diaphoretic.  Psychiatric: Memory, affect and judgment normal.    Lab Results  Component Value Date   TSH 1.08 10/08/2006   Lab Results  Component Value Date   WBC 7.8 03/30/2010   HGB 11.0* 03/30/2010   HCT 33.9* 03/30/2010   MCV 83.0 03/30/2010   PLT 290.0 03/30/2010   Lab Results  Component Value Date   CREATININE  0.8 03/30/2010   BUN 14 03/30/2010   NA 141 03/30/2010   K 4.0 03/30/2010   CL 105 03/30/2010   CO2 31 03/30/2010   Lab Results  Component Value Date   ALT 10 03/30/2010   AST 20 03/30/2010   ALKPHOS 92 03/30/2010   BILITOT 0.2* 03/30/2010   Lab Results  Component Value Date   CHOL 124 10/08/2006   Lab Results  Component Value Date   HDL 50.7 10/08/2006   Lab Results  Component Value Date   LDLCALC 49 10/08/2006   Lab Results  Component Value Date   TRIG 122 10/08/2006   Lab Results  Component Value Date   CHOLHDL 2.4 CALC 10/08/2006     Assessment & Plan  DEPRESSION Patient initially felt that her depression had improved somewhat on the Pristiq but now is more concerned about her increasing irritability and a worsening of her depression. She feels she was more even tempered on the Zoloft. We will return to the zoloft and titrate off the Pristiq. She will decrease Pristiq to QOD that Q every 3 rd day then stop. At the same time she will be increasing her Sertraline to 100mg  over the next couple of weeks. May use Lorazepam prn for irritability while we make this adjustment  SINUSITIS She reports improvement s/p antibiotics

## 2010-11-07 NOTE — Assessment & Plan Note (Signed)
She reports improvement s/p antibiotics

## 2010-11-13 ENCOUNTER — Other Ambulatory Visit: Payer: Self-pay

## 2010-11-13 DIAGNOSIS — F418 Other specified anxiety disorders: Secondary | ICD-10-CM

## 2010-11-13 MED ORDER — LORAZEPAM 0.5 MG PO TABS: 0.5000 mg | ORAL_TABLET | Freq: Two times a day (BID) | ORAL | Status: AC | PRN

## 2010-11-13 NOTE — Telephone Encounter (Signed)
Patient informed and RX in front cabinet 

## 2010-12-05 ENCOUNTER — Other Ambulatory Visit: Payer: Self-pay | Admitting: Family Medicine

## 2010-12-05 NOTE — Telephone Encounter (Signed)
Last seen 11/06/10, folllow up in one month.  No appt's in system.  PC to pt.  appt scheduled for Thursday.  Pt has enough meds to last until then.  We will fill at that time.

## 2010-12-07 ENCOUNTER — Ambulatory Visit (INDEPENDENT_AMBULATORY_CARE_PROVIDER_SITE_OTHER): Payer: BC Managed Care – PPO | Admitting: Family Medicine

## 2010-12-07 ENCOUNTER — Encounter: Payer: Self-pay | Admitting: Family Medicine

## 2010-12-07 DIAGNOSIS — F32A Depression, unspecified: Secondary | ICD-10-CM

## 2010-12-07 DIAGNOSIS — F3289 Other specified depressive episodes: Secondary | ICD-10-CM

## 2010-12-07 DIAGNOSIS — J329 Chronic sinusitis, unspecified: Secondary | ICD-10-CM

## 2010-12-07 DIAGNOSIS — Z780 Asymptomatic menopausal state: Secondary | ICD-10-CM

## 2010-12-07 DIAGNOSIS — D649 Anemia, unspecified: Secondary | ICD-10-CM

## 2010-12-07 DIAGNOSIS — F329 Major depressive disorder, single episode, unspecified: Secondary | ICD-10-CM

## 2010-12-07 DIAGNOSIS — J019 Acute sinusitis, unspecified: Secondary | ICD-10-CM

## 2010-12-07 MED ORDER — SERTRALINE HCL 100 MG PO TABS: 100.0000 mg | ORAL_TABLET | Freq: Every day | ORAL | Status: DC

## 2010-12-07 MED ORDER — CIPROFLOXACIN HCL 500 MG PO TABS: 500.0000 mg | ORAL_TABLET | Freq: Two times a day (BID) | ORAL | Status: AC

## 2010-12-07 NOTE — Assessment & Plan Note (Addendum)
Patient improved after last treatment but increased congestion and bloody nasal discharge over the past few days. She is given a prescription for Ciprofloxacin to use only if symptoms worsen, if she takes it she will start mucinex and a probiotic. Some b/l SOM is nted today, encouraged nasal saline and valsalva maneuver daily

## 2010-12-07 NOTE — Assessment & Plan Note (Signed)
Much better on Sertraline, less irritable and happier, is given a six months supply of this and asked to call if she develops any concerns

## 2010-12-07 NOTE — Patient Instructions (Signed)
Perimenopause Perimenopause is the time when your body begins to move into the menopause (no menstrual period for 12 straight months). It is a natural process. Perimenopause can begin 2 to 8 years before the menopause and usually lasts for one year after the menopause. During this time, your ovaries may or may not produce an egg. The ovaries vary in their production of estrogen and progesterone hormones each month. This can cause irregular menstrual periods, difficulty in getting pregnant, vaginal bleeding between periods and uncomfortable symptoms. CAUSES  Irregular production of the ovarian hormones, estrogen and progesterone, and not ovulating every month.   Other causes include:   Tumor of the pituitary gland in the brain.   Medical disease that affects the ovaries.   Radiation treatment.   Chemotherapy.   Unknown causes.   Heavy smoking and excessive alcohol intake can bring on perimenopause sooner.  SYMPTOMS   Hot flashes.   Night sweats.   Irregular menstrual periods.   Decrease sex drive.   Vaginal dryness.   Headaches.   Mood swings.   Depression.   Memory problems.   Irritability.   Tiredness.   Weight gain.   Trouble getting pregnant.   The beginning of losing bone cells (osteoporosis).   The beginning of hardening of the arteries (atherosclerosis).  DIAGNOSIS  Your caregiver will make a diagnosis by analyzing your age, menstrual history and your symptoms. They will do a physical exam noting any changes in your body, especially your female organs. Female hormone tests may or may not be helpful depending on the amount and when you produce the female hormones. However, other hormone tests may be helpful (ex. thyroid hormone) to rule out other problems. TREATMENT  The decision to treat during the perimenopause should be made by you and your caregiver depending on how the symptoms are affecting you and your life style. There are various treatments available  such as:  Treating individual symptoms with a specific medication for that symptom (ex. tranquilizer for depression).   Herbal medications that can help specific symptoms.   Counseling.   Group therapy.   No treatment.  HOME CARE INSTRUCTIONS   Before seeing your caregiver, make a list of your menstrual periods (when the occur, how heavy they are, how long between periods and how long they last), your symptoms and when they started.   Take the medication as recommended by your caregiver.   Sleep and rest.   Exercise.   Eat a diet that contains calcium (good for your bones) and soy (acts like estrogen hormone).   Do not smoke.   Avoid alcoholic beverages.   Taking vitamin E may help in certain cases.   Take calcium and vitamin D supplements to help prevent bone loss.   Group therapy is sometimes helpful.   Acupuncture may help in some cases.  SEEK MEDICAL CARE IF:   You have any of the above and want to know if it is perimenopause.   You want advice and treatment for any of your symptoms mentioned above.   You need a referral to a specialist (gynecologist, psychiatrist or psychologist).  SEEK IMMEDIATE MEDICAL CARE IF:   You have vaginal bleeding.   Your period lasts longer than 8 days.   You periods are recurring sooner than 21 days.   You have bleeding after intercourse.   You have severe depression.   You have pain when you urinate.   You have severe headaches.   You develop vision problems.  Document   Released: 01/26/2004 Document Revised: 08/30/2010 Document Reviewed: 10/16/2007 [facility] Patient Information 2012 [place], [place].  Start MegaRed caps daily  If you take the antibiotic start back on probiotics and mucinex

## 2010-12-07 NOTE — Assessment & Plan Note (Signed)
Mild in past patient agrees to annual labs at next visit

## 2010-12-07 NOTE — Progress Notes (Signed)
Jennifer Sandoval [id number] [birth year] 12/07/2010      Progress Note-Follow Up  Subjective  Chief Complaint  Chief Complaint  Patient presents with  . Follow-up    follow up on medication    HPI  This a 55 year old Caucasian female in today for followup on medication changes. At her last visit she was noted to be struggling with worsening anxiety, irritability and depression so her medication was switched from Kristi to sertraline which he has taken in the past. Overall she says she is improved. She's had much less irritability and her mood is improved. Her hot flashes unfortunately have worsened somewhat and are occurring several times a day but are tolerable. She has had some increasing nasal congestion and some bloody rhinorrhea recently. Some pressure and discomfort in her ear is also noted. No fevers, chills, chest pain, palpitations, shortness of breath, GI or GU complaints are noted  Past Medical History  Diagnosis Date  . Allergy     rhinitis  . Anemia     iron deficeincy  . Depression   . Lumbar compression fracture     nonsurgical  . SHOULDER PAIN, BILATERAL 10/10/2007  . PERIMENOPAUSAL STATUS 03/16/2010  . OSTEOARTHRITIS 10/24/2006  . Leiomyoma of uterus, unspecified 02/15/2010  . FRACTURE, NOSE 10/22/2007  . DEPRESSION 10/24/2006  . BASAL CELL CARCINOMA SKIN LOWER LIMB INCL HIP 02/15/2010  . ANEMIA-NOS 10/24/2006  . ALLERGIC RHINITIS 08/15/2006  . Dyspepsia 04/19/2010    Past Surgical History  Procedure Date  . Septoplasty   . Gastric bypass   . Tubal ligation   . Nasal sinus surgery   . Cesarean section     X 3  . Appendectomy   . Cholecystectomy   . Ventral herniorrhaphy     X 2  . Abdominal hysterectomy 2009    partial, ovaries left in place, for painful, heavy menstrrual bleeding secondary to anemia and fibroids  . Skin biopsy     L hip BCC, facial lesions were benign    Family History  Problem Relation Age of Onset  . Cholelithiasis Mother   .  Other Mother     Urinary incontince/ bladder prolapse/ h/o smoking  . Cancer Mother     laryngeal carcinoma  . Allergies Mother   . Other Father     Renal failure  . Hypertension Father   . Coronary artery disease Father     s/p bypass  . Aortic aneurysm Father   . Hyperlipidemia Father   . Heart disease Father   . Spina bifida Brother     with stunt/ self cath  . Seizures Brother     disorders  . ADD / ADHD Daughter   . Cancer Maternal Grandmother     colon/ smoker  . Cancer Maternal Grandfather     lung  . Cancer Paternal Grandmother     Breast  . Heart disease Paternal Grandfather   . Stroke Paternal Grandfather   . ADD / ADHD Daughter     History   Social History  . Marital Status: Married    Spouse Name: N/A    Number of Children: N/A  . Years of Education: N/A   Occupational History  . Not on file.   Social History Main Topics  . Smoking status: Never Smoker   . Smokeless tobacco: Never Used  . Alcohol Use: Yes     special occasion  . Drug Use: No  . Sexually Active: Yes -- Female partner(s)   Other  Topics Concern  . Not on file   Social History Narrative  . No narrative on file    Current Outpatient Prescriptions on File Prior to Visit  Medication Sig Dispense Refill  . cetirizine (ZYRTEC) 10 MG tablet 1 tab daily, may take a second any given day as needed allergies  60 tablet  1  . desvenlafaxine (PRISTIQ) 50 MG 24 hr tablet 1 tab po every other day x 2 weeks then drop to 1 tab po every third day for 2 weeks then stop  30 tablet  0  . fluticasone (FLONASE) 50 MCG/ACT nasal spray Place 1 spray into the nose daily as needed for rhinitis or allergies (1 spray each nostril daily). For allergies  16 g  3  . guaiFENesin (MUCINEX) 600 MG 12 hr tablet Take 1 tablet (600 mg total) by mouth 2 (two) times daily.  20 tablet  1  . LORazepam (ATIVAN) 0.5 MG tablet Take 1 tablet (0.5 mg total) by mouth 2 (two) times daily as needed for anxiety.  10 tablet  0  .  sertraline (ZOLOFT) 50 MG tablet 1 tab po q day x 7 days then increase to 2 tabs po q day  60 tablet  1  . vitamin B-12 (CYANOCOBALAMIN) 1000 MCG tablet Take 1,000 mcg by mouth daily.        Marland Kitchen VITAMIN D, CHOLECALCIFEROL, PO Take 1 tablet by mouth daily.        Marland Kitchen zolpidem (AMBIEN) 10 MG tablet Take 1 tablet (10 mg total) by mouth at bedtime as needed.  30 tablet  2   Current Facility-Administered Medications on File Prior to Visit  Medication Dose Route Frequency Provider Last Rate Last Dose  . 0.9 %  sodium chloride infusion  500 mL Intravenous Continuous Sheryn Bison, MD        Allergies  Allergen Reactions  . Itraconazole     REACTION: Rash    Review of Systems  Review of Systems  Constitutional: Positive for malaise/fatigue. Negative for fever.  HENT: Positive for nosebleeds. Negative for congestion.        Right side bloody sputum  Eyes: Negative for discharge.  Respiratory: Negative for shortness of breath.   Cardiovascular: Negative for chest pain, palpitations and leg swelling.  Gastrointestinal: Positive for nausea. Negative for vomiting, abdominal pain and diarrhea.  Genitourinary: Negative for dysuria.  Musculoskeletal: Negative for falls.  Skin: Negative for rash.  Neurological: Negative for loss of consciousness and headaches.  Endo/Heme/Allergies: Negative for polydipsia.  Psychiatric/Behavioral: Negative for depression and suicidal ideas. The patient is not nervous/anxious and does not have insomnia.        Patient notes feeling much more calm on the Sertraline than on Pristiq. Her only thing that is worse is the hot flashes off and on during the day. Tolerable    Objective  BP 122/83  Pulse 86  Temp(Src) 99.1 F (37.3 C) (Oral)  Ht 5\' 2"  (1.575 m)  Wt 134 lb 12.8 oz (61.145 kg)  BMI 24.66 kg/m2  SpO2 97%  Physical Exam  Physical Exam  Constitutional: She is oriented to person, place, and time and well-developed, well-nourished, and in no distress. No  distress.  HENT:  Head: Normocephalic and atraumatic.  Eyes: Conjunctivae are normal.  Neck: Neck supple. No thyromegaly present.  Cardiovascular: Normal rate, regular rhythm and normal heart sounds.   No murmur heard. Pulmonary/Chest: Effort normal and breath sounds normal. She has no wheezes.  Abdominal: She exhibits no distension  and no mass.  Musculoskeletal: She exhibits no edema.  Lymphadenopathy:    She has no cervical adenopathy.  Neurological: She is alert and oriented to person, place, and time.  Skin: Skin is warm and dry. No rash noted. She is not diaphoretic.  Psychiatric: Memory, affect and judgment normal.    Lab Results  Component Value Date   TSH 1.08 10/08/2006   Lab Results  Component Value Date   WBC 7.8 03/30/2010   HGB 11.0* 03/30/2010   HCT 33.9* 03/30/2010   MCV 83.0 03/30/2010   PLT 290.0 03/30/2010   Lab Results  Component Value Date   CREATININE 0.8 03/30/2010   BUN 14 03/30/2010   NA 141 03/30/2010   K 4.0 03/30/2010   CL 105 03/30/2010   CO2 31 03/30/2010   Lab Results  Component Value Date   ALT 10 03/30/2010   AST 20 03/30/2010   ALKPHOS 92 03/30/2010   BILITOT 0.2* 03/30/2010   Lab Results  Component Value Date   CHOL 124 10/08/2006   Lab Results  Component Value Date   HDL 50.7 10/08/2006   Lab Results  Component Value Date   LDLCALC 49 10/08/2006   Lab Results  Component Value Date   TRIG 122 10/08/2006   Lab Results  Component Value Date   CHOLHDL 2.4 CALC 10/08/2006     Assessment & Plan    SINUSITIS Patient improved after last treatment but increased congestion and bloody nasal discharge over the past few days. She is given a prescription for Ciprofloxacin to use only if symptoms worsen, if she takes it she will start mucinex and a probiotic. Some b/l SOM is nted today, encouraged nasal saline and valsalva maneuver daily  PERIMENOPAUSAL STATUS Encouraged small, frequent meals, good sleep and add a MegaRed supplement  daily  DEPRESSION Much better on Sertraline, less irritable and happier, is given a six months supply of this and asked to call if she develops any concerns  ANEMIA-NOS Mild in past patient agrees to annual labs at next visit

## 2010-12-07 NOTE — Assessment & Plan Note (Signed)
Encouraged small, frequent meals, good sleep and add a MegaRed supplement daily

## 2010-12-18 ENCOUNTER — Other Ambulatory Visit: Payer: Self-pay | Admitting: Family Medicine

## 2010-12-18 DIAGNOSIS — G4700 Insomnia, unspecified: Secondary | ICD-10-CM

## 2010-12-18 MED ORDER — ZOLPIDEM TARTRATE 10 MG PO TABS: 10.0000 mg | ORAL_TABLET | Freq: Every evening | ORAL | Status: DC | PRN

## 2010-12-18 NOTE — Telephone Encounter (Signed)
Please advise Zolpidem refill?

## 2010-12-18 NOTE — Telephone Encounter (Signed)
Please refill zolpidem to [facility]

## 2011-01-12 ENCOUNTER — Other Ambulatory Visit: Payer: Self-pay

## 2011-01-12 MED ORDER — CIPROFLOXACIN HCL 500 MG PO TABS: 500.0000 mg | ORAL_TABLET | Freq: Two times a day (BID) | ORAL | Status: DC

## 2011-01-12 NOTE — Telephone Encounter (Signed)
OK to RF as previously prescribed by Dr. Abner Greenspan x 1.  Thx

## 2011-01-12 NOTE — Telephone Encounter (Signed)
Patient states she just finished Cipro last week for a sinus infection. Pt stated that it has came back and would like to know if she can have another round of Cipro? Please advise?

## 2011-03-12 ENCOUNTER — Encounter: Payer: Self-pay | Admitting: Family Medicine

## 2011-03-12 ENCOUNTER — Ambulatory Visit (INDEPENDENT_AMBULATORY_CARE_PROVIDER_SITE_OTHER): Payer: BC Managed Care – PPO | Admitting: Family Medicine

## 2011-03-12 VITALS — BP 137/85 | HR 97 | Temp 98.5°F | Ht 62.0 in | Wt 136.8 lb

## 2011-03-12 DIAGNOSIS — G4700 Insomnia, unspecified: Secondary | ICD-10-CM

## 2011-03-12 DIAGNOSIS — J329 Chronic sinusitis, unspecified: Secondary | ICD-10-CM

## 2011-03-12 DIAGNOSIS — J309 Allergic rhinitis, unspecified: Secondary | ICD-10-CM

## 2011-03-12 MED ORDER — ZOLPIDEM TARTRATE 10 MG PO TABS: 10.0000 mg | ORAL_TABLET | Freq: Every evening | ORAL | Status: DC | PRN

## 2011-03-12 MED ORDER — LEVOFLOXACIN 500 MG PO TABS: 500.0000 mg | ORAL_TABLET | Freq: Every day | ORAL | Status: DC

## 2011-03-12 NOTE — Patient Instructions (Signed)

## 2011-03-12 NOTE — Assessment & Plan Note (Signed)
Started on Levaquin daily and asked to continue mucinex bid

## 2011-03-12 NOTE — Progress Notes (Signed)
Patient ID: Jennifer Sandoval, female   DOB: Apr 28, 1955, 56 y.o.   MRN: [id number] JALEYA PEBLEY [id number] August 24, 1955 03/12/2011      Progress Note-Follow Up  Subjective  Chief Complaint  Chief Complaint  Patient presents with  . Sinusitis    headache, congestion (brownish-green)     HPI  Patient reports a 1 week history of increased facial pressure especially behind right eye. She also notes fever, malaise, fatigue, cough, nasal congestion and a sense of disequilibrium. No cp/palp/SOB/gi or gu c/o. Has been using nasal saline and antihistamines with some relief  Past Medical History  Diagnosis Date  . Allergy     rhinitis  . Anemia     iron deficeincy  . Depression   . Lumbar compression fracture     nonsurgical  . SHOULDER PAIN, BILATERAL 10/10/2007  . PERIMENOPAUSAL STATUS 03/16/2010  . OSTEOARTHRITIS 10/24/2006  . Leiomyoma of uterus, unspecified 02/15/2010  . FRACTURE, NOSE 10/22/2007  . DEPRESSION 10/24/2006  . BASAL CELL CARCINOMA SKIN LOWER LIMB INCL HIP 02/15/2010  . ANEMIA-NOS 10/24/2006  . ALLERGIC RHINITIS 08/15/2006  . Dyspepsia 04/19/2010  . Acute sinusitis 07/31/2006    Past Surgical History  Procedure Date  . Septoplasty   . Gastric bypass   . Tubal ligation   . Nasal sinus surgery   . Cesarean section     X 3  . Appendectomy   . Cholecystectomy   . Ventral herniorrhaphy     X 2  . Abdominal hysterectomy 2009    partial, ovaries left in place, for painful, heavy menstrrual bleeding secondary to anemia and fibroids  . Skin biopsy     L hip BCC, facial lesions were benign    Family History  Problem Relation Age of Onset  . Cholelithiasis Mother   . Other Mother     Urinary incontince/ bladder prolapse/ h/o smoking  . Cancer Mother     laryngeal carcinoma  . Allergies Mother   . Other Father     Renal failure  . Hypertension Father   . Coronary artery disease Father     s/p bypass  . Aortic aneurysm Father   . Hyperlipidemia Father     . Heart disease Father   . Spina bifida Brother     with stunt/ self cath  . Seizures Brother     disorders  . ADD / ADHD Daughter   . Cancer Maternal Grandmother     colon/ smoker  . Cancer Maternal Grandfather     lung  . Cancer Paternal Grandmother     Breast  . Heart disease Paternal Grandfather   . Stroke Paternal Grandfather   . ADD / ADHD Daughter     History   Social History  . Marital Status: Married    Spouse Name: N/A    Number of Children: N/A  . Years of Education: N/A   Occupational History  . Not on file.   Social History Main Topics  . Smoking status: Never Smoker   . Smokeless tobacco: Never Used  . Alcohol Use: Yes     special occasion  . Drug Use: No  . Sexually Active: Yes -- Female partner(s)   Other Topics Concern  . Not on file   Social History Narrative  . No narrative on file    Current Outpatient Prescriptions on File Prior to Visit  Medication Sig Dispense Refill  . cetirizine (ZYRTEC) 10 MG tablet 1 tab daily, may take a second any  given day as needed allergies  60 tablet  1  . sertraline (ZOLOFT) 100 MG tablet Take 1 tablet (100 mg total) by mouth daily.  30 tablet  5  . vitamin B-12 (CYANOCOBALAMIN) 1000 MCG tablet Take 1,000 mcg by mouth daily.        Marland Kitchen VITAMIN D, CHOLECALCIFEROL, PO Take 1 tablet by mouth daily.        . fluticasone (FLONASE) 50 MCG/ACT nasal spray Place 1 spray into the nose daily as needed for rhinitis or allergies (1 spray each nostril daily). For allergies  16 g  3  . guaiFENesin (MUCINEX) 600 MG 12 hr tablet Take 1 tablet (600 mg total) by mouth 2 (two) times daily.  20 tablet  1  . LORazepam (ATIVAN) 0.5 MG tablet Take 1 tablet (0.5 mg total) by mouth 2 (two) times daily as needed for anxiety.  10 tablet  0   Current Facility-Administered Medications on File Prior to Visit  Medication Dose Route Frequency Provider Last Rate Last Dose  . 0.9 %  sodium chloride infusion  500 mL Intravenous Continuous Mardella Layman, MD        Allergies  Allergen Reactions  . Itraconazole     REACTION: Rash    Review of Systems  Review of Systems  Constitutional: Positive for fever and malaise/fatigue.  HENT: Positive for congestion.        Pressure noted behind right eye no visual changes  Eyes: Negative for discharge.  Respiratory: Negative for shortness of breath.   Cardiovascular: Negative for chest pain, palpitations and leg swelling.  Gastrointestinal: Negative for nausea, abdominal pain and diarrhea.  Genitourinary: Negative for dysuria.  Musculoskeletal: Negative for falls.  Skin: Negative for rash.  Neurological: Positive for headaches. Negative for loss of consciousness.  Endo/Heme/Allergies: Negative for polydipsia.  Psychiatric/Behavioral: Negative for depression and suicidal ideas. The patient is not nervous/anxious and does not have insomnia.     Objective  BP 137/85  Pulse 97  Temp(Src) 98.5 F (36.9 C) (Temporal)  Ht 5\' 2"  (1.575 m)  Wt 136 lb 12.8 oz (62.052 kg)  BMI 25.02 kg/m2  SpO2 98%  Physical Exam  Physical Exam  Constitutional: She is oriented to person, place, and time and well-developed, well-nourished, and in no distress. No distress.  HENT:  Head: Normocephalic and atraumatic.       TMs dull and retracted  Eyes: Conjunctivae are normal.  Neck: Neck supple. No thyromegaly present.  Cardiovascular: Normal rate, regular rhythm and normal heart sounds.   No murmur heard. Pulmonary/Chest: Effort normal and breath sounds normal. She has no wheezes.  Abdominal: She exhibits no distension and no mass.  Musculoskeletal: She exhibits no edema.  Lymphadenopathy:    She has no cervical adenopathy.  Neurological: She is alert and oriented to person, place, and time.  Skin: Skin is warm and dry. No rash noted. She is not diaphoretic.  Psychiatric: Memory, affect and judgment normal.    Lab Results  Component Value Date   TSH 1.08 10/08/2006   Lab Results    Component Value Date   WBC 7.8 03/30/2010   HGB 11.0* 03/30/2010   HCT 33.9* 03/30/2010   MCV 83.0 03/30/2010   PLT 290.0 03/30/2010   Lab Results  Component Value Date   CREATININE 0.8 03/30/2010   BUN 14 03/30/2010   NA 141 03/30/2010   K 4.0 03/30/2010   CL 105 03/30/2010   CO2 31 03/30/2010   Lab Results  Component Value Date   ALT 10 03/30/2010   AST 20 03/30/2010   ALKPHOS 92 03/30/2010   BILITOT 0.2* 03/30/2010   Lab Results  Component Value Date   CHOL 124 10/08/2006   Lab Results  Component Value Date   HDL 50.7 10/08/2006   Lab Results  Component Value Date   LDLCALC 49 10/08/2006   Lab Results  Component Value Date   TRIG 122 10/08/2006   Lab Results  Component Value Date   CHOLHDL 2.4 CALC 10/08/2006     Assessment & Plan  Acute sinusitis Started on Levaquin daily and asked to continue mucinex bid  ALLERGIC RHINITIS Encouraged to continue nasal steroid, daily antihistamine and call if no improvement

## 2011-03-12 NOTE — Assessment & Plan Note (Signed)
Encouraged to continue nasal steroid, daily antihistamine and call if no improvement

## 2011-05-30 ENCOUNTER — Other Ambulatory Visit: Payer: Self-pay

## 2011-05-30 DIAGNOSIS — F32A Depression, unspecified: Secondary | ICD-10-CM

## 2011-05-30 DIAGNOSIS — F329 Major depressive disorder, single episode, unspecified: Secondary | ICD-10-CM

## 2011-05-30 MED ORDER — SERTRALINE HCL 100 MG PO TABS: 100.0000 mg | ORAL_TABLET | Freq: Every day | ORAL | Status: DC

## 2011-06-08 ENCOUNTER — Ambulatory Visit (INDEPENDENT_AMBULATORY_CARE_PROVIDER_SITE_OTHER): Payer: BC Managed Care – PPO | Admitting: Family Medicine

## 2011-06-08 ENCOUNTER — Encounter: Payer: Self-pay | Admitting: Family Medicine

## 2011-06-08 VITALS — BP 125/83 | HR 85 | Temp 99.0°F | Ht 62.0 in | Wt 134.1 lb

## 2011-06-08 DIAGNOSIS — J329 Chronic sinusitis, unspecified: Secondary | ICD-10-CM

## 2011-06-08 DIAGNOSIS — J019 Acute sinusitis, unspecified: Secondary | ICD-10-CM

## 2011-06-08 MED ORDER — LEVOFLOXACIN 500 MG PO TABS: 500.0000 mg | ORAL_TABLET | Freq: Every day | ORAL | Status: DC

## 2011-06-08 NOTE — Patient Instructions (Signed)

## 2011-06-12 NOTE — Progress Notes (Signed)
Patient ID: Jennifer Sandoval, female   DOB: Nov 08, 1955, 56 y.o.   MRN: [id number] Jennifer Sandoval [id number] 04/03/1955 06/12/2011      Progress Note-Follow Up  Subjective  Chief Complaint  Chief Complaint  Patient presents with  . Sinusitis    chest congestion, cough X 2 weeks    HPI  Patient is a 56 -year-old Caucasian female who is in today with a 12 history of worsening congestion. She has head congestion productive of clear phlegm but also postnasal drip and cough productive of thick  yellow and green phlegm. She notes increasing malaise, fevers, sore throat, intermittent ear pain, wheezing and shortness of breath. She has some chest discomfort when she coughs especially. No obvious chills, anorexia, nausea, vomiting, diarrhea or GU complaints. She has been trying to increase her rest and fluids without significant results.  Past Medical History  Diagnosis Date  . Allergy     rhinitis  . Anemia     iron deficeincy  . Depression   . Lumbar compression fracture     nonsurgical  . SHOULDER PAIN, BILATERAL 10/10/2007  . PERIMENOPAUSAL STATUS 03/16/2010  . OSTEOARTHRITIS 10/24/2006  . Leiomyoma of uterus, unspecified 02/15/2010  . FRACTURE, NOSE 10/22/2007  . DEPRESSION 10/24/2006  . BASAL CELL CARCINOMA SKIN LOWER LIMB INCL HIP 02/15/2010  . ANEMIA-NOS 10/24/2006  . ALLERGIC RHINITIS 08/15/2006  . Dyspepsia 04/19/2010  . Acute sinusitis 07/31/2006    Past Surgical History  Procedure Date  . Septoplasty   . Gastric bypass   . Tubal ligation   . Nasal sinus surgery   . Cesarean section     X 3  . Appendectomy   . Cholecystectomy   . Ventral herniorrhaphy     X 2  . Abdominal hysterectomy 2009    partial, ovaries left in place, for painful, heavy menstrrual bleeding secondary to anemia and fibroids  . Skin biopsy     L hip BCC, facial lesions were benign    Family History  Problem Relation Age of Onset  . Cholelithiasis Mother   . Other Mother     Urinary  incontince/ bladder prolapse/ h/o smoking  . Cancer Mother     laryngeal carcinoma  . Allergies Mother   . Other Father     Renal failure  . Hypertension Father   . Coronary artery disease Father     s/p bypass  . Aortic aneurysm Father   . Hyperlipidemia Father   . Heart disease Father   . Spina bifida Brother     with stunt/ self cath  . Seizures Brother     disorders  . ADD / ADHD Daughter   . Cancer Maternal Grandmother     colon/ smoker  . Cancer Maternal Grandfather     lung  . Cancer Paternal Grandmother     Breast  . Heart disease Paternal Grandfather   . Stroke Paternal Grandfather   . ADD / ADHD Daughter     History   Social History  . Marital Status: Married    Spouse Name: N/A    Number of Children: N/A  . Years of Education: N/A   Occupational History  . Not on file.   Social History Main Topics  . Smoking status: Never Smoker   . Smokeless tobacco: Never Used  . Alcohol Use: Yes     special occasion  . Drug Use: No  . Sexually Active: Yes -- Female partner(s)   Other Topics Concern  .  Not on file   Social History Narrative  . No narrative on file    Current Outpatient Prescriptions on File Prior to Visit  Medication Sig Dispense Refill  . cetirizine (ZYRTEC) 10 MG tablet 1 tab daily, may take a second any given day as needed allergies  60 tablet  1  . guaiFENesin (MUCINEX) 600 MG 12 hr tablet Take 1 tablet (600 mg total) by mouth 2 (two) times daily.  20 tablet  1  . sertraline (ZOLOFT) 100 MG tablet Take 1 tablet (100 mg total) by mouth daily.  30 tablet  5  . vitamin B-12 (CYANOCOBALAMIN) 1000 MCG tablet Take 1,000 mcg by mouth daily.        Marland Kitchen VITAMIN D, CHOLECALCIFEROL, PO Take 1 tablet by mouth daily.        Marland Kitchen zolpidem (AMBIEN) 10 MG tablet Take 1 tablet (10 mg total) by mouth at bedtime as needed.  30 tablet  3  . fluticasone (FLONASE) 50 MCG/ACT nasal spray Place 1 spray into the nose daily as needed for rhinitis or allergies (1 spray  each nostril daily). For allergies  16 g  3  . LORazepam (ATIVAN) 0.5 MG tablet Take 1 tablet (0.5 mg total) by mouth 2 (two) times daily as needed for anxiety.  10 tablet  0   Current Facility-Administered Medications on File Prior to Visit  Medication Dose Route Frequency Provider Last Rate Last Dose  . 0.9 %  sodium chloride infusion  500 mL Intravenous Continuous Mardella Layman, MD        Allergies  Allergen Reactions  . Itraconazole     REACTION: Rash    Review of Systems  Review of Systems  Constitutional: Positive for fever and malaise/fatigue.  HENT: Positive for ear pain, congestion and sore throat.   Eyes: Negative for discharge.  Respiratory: Positive for cough, sputum production, shortness of breath and wheezing.   Cardiovascular: Positive for chest pain. Negative for palpitations and leg swelling.       With cough  Gastrointestinal: Negative for nausea, abdominal pain and diarrhea.  Genitourinary: Negative for dysuria.  Musculoskeletal: Negative for falls.  Skin: Negative for rash.  Neurological: Positive for headaches. Negative for loss of consciousness.  Endo/Heme/Allergies: Negative for polydipsia.  Psychiatric/Behavioral: Negative for depression and suicidal ideas. The patient is not nervous/anxious and does not have insomnia.     Objective  BP 125/83  Pulse 85  Temp(Src) 99 F (37.2 C) (Temporal)  Ht 5\' 2"  (1.575 m)  Wt 134 lb 1.9 oz (60.836 kg)  BMI 24.53 kg/m2  SpO2 100%  Physical Exam  Physical Exam  Constitutional: She is oriented to person, place, and time and well-developed, well-nourished, and in no distress. No distress.  HENT:  Head: Normocephalic and atraumatic.       Nasal mucosa boggy and erythematous  Eyes: Conjunctivae are normal.  Neck: Neck supple. No thyromegaly present.  Cardiovascular: Normal rate, regular rhythm and normal heart sounds.   No murmur heard. Pulmonary/Chest: Effort normal and breath sounds normal. She has no  wheezes.  Abdominal: She exhibits no distension and no mass.  Musculoskeletal: She exhibits no edema.  Lymphadenopathy:    She has cervical adenopathy.  Neurological: She is alert and oriented to person, place, and time.  Skin: Skin is warm and dry. No rash noted. She is not diaphoretic.  Psychiatric: Memory, affect and judgment normal.    Lab Results  Component Value Date   TSH 1.08 10/08/2006  Lab Results  Component Value Date   WBC 7.8 03/30/2010   HGB 11.0* 03/30/2010   HCT 33.9* 03/30/2010   MCV 83.0 03/30/2010   PLT 290.0 03/30/2010   Lab Results  Component Value Date   CREATININE 0.8 03/30/2010   BUN 14 03/30/2010   NA 141 03/30/2010   K 4.0 03/30/2010   CL 105 03/30/2010   CO2 31 03/30/2010   Lab Results  Component Value Date   ALT 10 03/30/2010   AST 20 03/30/2010   ALKPHOS 92 03/30/2010   BILITOT 0.2* 03/30/2010   Lab Results  Component Value Date   CHOL 124 10/08/2006   Lab Results  Component Value Date   HDL 50.7 10/08/2006   Lab Results  Component Value Date   LDLCALC 49 10/08/2006   Lab Results  Component Value Date   TRIG 122 10/08/2006   Lab Results  Component Value Date   CHOLHDL 2.4 CALC 10/08/2006     Assessment & Plan  Acute sinusitis Has been present about 12 days, patient responds well to Levaquin and Mucinex, will restart and patient encouraged to increase rest and hydration

## 2011-06-12 NOTE — Assessment & Plan Note (Signed)
Has been present about 12 days, patient responds well to Levaquin and Mucinex, will restart and patient encouraged to increase rest and hydration

## 2011-07-03 ENCOUNTER — Other Ambulatory Visit (INDEPENDENT_AMBULATORY_CARE_PROVIDER_SITE_OTHER): Payer: BC Managed Care – PPO

## 2011-07-03 DIAGNOSIS — Z0000 Encounter for general adult medical examination without abnormal findings: Secondary | ICD-10-CM

## 2011-07-03 LAB — RENAL FUNCTION PANEL
Albumin: 3.9 g/dL (ref 3.5–5.2)
BUN: 17 mg/dL (ref 6–23)
CO2: 30 mEq/L (ref 19–32)
Calcium: 9.1 mg/dL (ref 8.4–10.5)
Chloride: 104 mEq/L (ref 96–112)
Phosphorus: 5 mg/dL — ABNORMAL HIGH (ref 2.3–4.6)
Potassium: 4.3 mEq/L (ref 3.5–5.1)

## 2011-07-03 LAB — HEPATIC FUNCTION PANEL
Albumin: 3.9 g/dL (ref 3.5–5.2)
Alkaline Phosphatase: 84 U/L (ref 39–117)
Bilirubin, Direct: 0 mg/dL (ref 0.0–0.3)
Total Bilirubin: 0.3 mg/dL (ref 0.3–1.2)

## 2011-07-03 LAB — LIPID PANEL
Cholesterol: 137 mg/dL (ref 0–200)
LDL Cholesterol: 47 mg/dL (ref 0–99)
Triglycerides: 75 mg/dL (ref 0.0–149.0)

## 2011-07-03 LAB — CBC
HCT: 33.1 % — ABNORMAL LOW (ref 36.0–46.0)
Platelets: 227 10*3/uL (ref 150.0–400.0)
RBC: 4.12 Mil/uL (ref 3.87–5.11)
WBC: 5.7 10*3/uL (ref 4.5–10.5)

## 2011-07-09 ENCOUNTER — Ambulatory Visit: Payer: BC Managed Care – PPO | Admitting: Family Medicine

## 2011-07-10 ENCOUNTER — Encounter: Payer: Self-pay | Admitting: Family Medicine

## 2011-07-10 ENCOUNTER — Ambulatory Visit (INDEPENDENT_AMBULATORY_CARE_PROVIDER_SITE_OTHER): Payer: BC Managed Care – PPO | Admitting: Family Medicine

## 2011-07-10 VITALS — BP 114/77 | HR 73 | Temp 98.6°F | Resp 18 | Wt 133.4 lb

## 2011-07-10 DIAGNOSIS — F329 Major depressive disorder, single episode, unspecified: Secondary | ICD-10-CM

## 2011-07-10 DIAGNOSIS — Z780 Asymptomatic menopausal state: Secondary | ICD-10-CM

## 2011-07-10 DIAGNOSIS — F3289 Other specified depressive episodes: Secondary | ICD-10-CM

## 2011-07-10 DIAGNOSIS — C4491 Basal cell carcinoma of skin, unspecified: Secondary | ICD-10-CM

## 2011-07-10 DIAGNOSIS — C44711 Basal cell carcinoma of skin of unspecified lower limb, including hip: Secondary | ICD-10-CM

## 2011-07-10 DIAGNOSIS — J019 Acute sinusitis, unspecified: Secondary | ICD-10-CM

## 2011-07-10 DIAGNOSIS — J309 Allergic rhinitis, unspecified: Secondary | ICD-10-CM

## 2011-07-10 DIAGNOSIS — D649 Anemia, unspecified: Secondary | ICD-10-CM

## 2011-07-10 HISTORY — DX: Disorder of phosphorus metabolism, unspecified: E83.30

## 2011-07-10 HISTORY — DX: Basal cell carcinoma of skin, unspecified: C44.91

## 2011-07-10 NOTE — Assessment & Plan Note (Signed)
Tolerable symptoms at this time, no change in meds

## 2011-07-10 NOTE — Assessment & Plan Note (Signed)
Mildly elevated encouraged to minimize phosphorus rich foods and recheck next month

## 2011-07-10 NOTE — Assessment & Plan Note (Deleted)
New lesion on left side of nose and above left eyebrow

## 2011-07-10 NOTE — Progress Notes (Signed)
Patient ID: Jennifer Sandoval, female   DOB: 1955/04/20, 56 y.o.   MRN: [id number] HAELIE CLAPP [id number] September 19, 1955 07/10/2011      Progress Note-Follow Up  Subjective  Chief Complaint  Chief Complaint  Patient presents with  . Follow-up    sinusitis    HPI  Patient is a 56 yo caucasian female in today for follow up on her recent sinusitis. She feels much better since her treatment with Levaquin. She denies any persistent nasal congestion, headaches fevers or chills. Otherwise physically she feels well. No chest pain, palpitations, shortness of breath, GI or GU complaints. She continues to struggle with stress but feels her medications are helping her manage her depression relatively well. She has noted 2 new slightly shiny. They have been present for over 6 months but are not resolving raised lesions on her face one above her left eyebrow one on the left side of her nose  Past Medical History  Diagnosis Date  . Allergy     rhinitis  . Anemia     iron deficeincy  . Depression   . Lumbar compression fracture     nonsurgical  . SHOULDER PAIN, BILATERAL 10/10/2007  . PERIMENOPAUSAL STATUS 03/16/2010  . OSTEOARTHRITIS 10/24/2006  . Leiomyoma of uterus, unspecified 02/15/2010  . FRACTURE, NOSE 10/22/2007  . DEPRESSION 10/24/2006  . BASAL CELL CARCINOMA SKIN LOWER LIMB INCL HIP 02/15/2010  . ANEMIA-NOS 10/24/2006  . ALLERGIC RHINITIS 08/15/2006  . Dyspepsia 04/19/2010  . Acute sinusitis 07/31/2006  . BCC (basal cell carcinoma of skin) 07/10/2011    h/o  . 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    Past Surgical History  Procedure Date  . Septoplasty   . Gastric bypass   . Tubal ligation   . Nasal sinus surgery   . Cesarean section     X 3  . Appendectomy   . Cholecystectomy   . Ventral herniorrhaphy     X 2  . Abdominal hysterectomy 2009    partial, ovaries left in place, for painful, heavy menstrrual bleeding secondary to anemia and fibroids  . Skin biopsy     L  hip BCC, facial lesions were benign    Family History  Problem Relation Age of Onset  . Cholelithiasis Mother   . Other Mother     Urinary incontince/ bladder prolapse/ h/o smoking  . Cancer Mother     laryngeal carcinoma  . Allergies Mother   . Other Father     Renal failure  . Hypertension Father   . Coronary artery disease Father     s/p bypass  . Aortic aneurysm Father   . Hyperlipidemia Father   . Heart disease Father   . Spina bifida Brother     with stunt/ self cath  . Seizures Brother     disorders  . ADD / ADHD Daughter   . Cancer Maternal Grandmother     colon/ smoker  . Cancer Maternal Grandfather     lung  . Cancer Paternal Grandmother     Breast  . Heart disease Paternal Grandfather   . Stroke Paternal Grandfather   . ADD / ADHD Daughter     History   Social History  . Marital Status: Married    Spouse Name: N/A    Number of Children: N/A  . Years of Education: N/A   Occupational History  . Not on file.   Social History Main Topics  . Smoking status: Never Smoker   . Smokeless  tobacco: Never Used  . Alcohol Use: Yes     special occasion  . Drug Use: No  . Sexually Active: Yes -- Female partner(s)   Other Topics Concern  . Not on file   Social History Narrative  . No narrative on file    Current Outpatient Prescriptions on File Prior to Visit  Medication Sig Dispense Refill  . Biotin 10 MG CAPS Take 1 tablet by mouth daily.      . cetirizine (ZYRTEC) 10 MG tablet 1 tab daily, may take a second any given day as needed allergies  60 tablet  1  . fluticasone (FLONASE) 50 MCG/ACT nasal spray Place 1 spray into the nose daily as needed for rhinitis or allergies (1 spray each nostril daily). For allergies  16 g  3  . guaiFENesin (MUCINEX) 600 MG 12 hr tablet Take 1 tablet (600 mg total) by mouth 2 (two) times daily.  20 tablet  1  . LORazepam (ATIVAN) 0.5 MG tablet Take 1 tablet (0.5 mg total) by mouth 2 (two) times daily as needed for anxiety.   10 tablet  0  . sertraline (ZOLOFT) 100 MG tablet Take 1 tablet (100 mg total) by mouth daily.  30 tablet  5  . vitamin B-12 (CYANOCOBALAMIN) 1000 MCG tablet Take 1,000 mcg by mouth daily.        Marland Kitchen VITAMIN D, CHOLECALCIFEROL, PO Take 1 tablet by mouth daily.        Marland Kitchen zolpidem (AMBIEN) 10 MG tablet Take 1 tablet (10 mg total) by mouth at bedtime as needed.  30 tablet  3   Current Facility-Administered Medications on File Prior to Visit  Medication Dose Route Frequency Provider Last Rate Last Dose  . 0.9 %  sodium chloride infusion  500 mL Intravenous Continuous Mardella Layman, MD        Allergies  Allergen Reactions  . Itraconazole     REACTION: Rash    Review of Systems  Review of Systems  Constitutional: Negative for fever and malaise/fatigue.  HENT: Negative for congestion.   Eyes: Negative for discharge.  Respiratory: Negative for shortness of breath.   Cardiovascular: Negative for chest pain, palpitations and leg swelling.  Gastrointestinal: Negative for nausea, abdominal pain and diarrhea.  Genitourinary: Negative for dysuria.  Musculoskeletal: Negative for falls.  Skin: Positive for rash.  Neurological: Negative for loss of consciousness and headaches.  Endo/Heme/Allergies: Negative for polydipsia.  Psychiatric/Behavioral: Negative for depression and suicidal ideas. The patient is not nervous/anxious and does not have insomnia.     Objective  BP 114/77  Pulse 73  Temp 98.6 F (37 C) (Oral)  Resp 18  Wt 133 lb 6.4 oz (60.51 kg)  SpO2 99%  Physical Exam  Physical Exam  Constitutional: She is oriented to person, place, and time and well-developed, well-nourished, and in no distress. No distress.  HENT:  Head: Normocephalic and atraumatic.  Eyes: Conjunctivae are normal.  Neck: Neck supple. No thyromegaly present.  Cardiovascular: Normal rate, regular rhythm and normal heart sounds.   No murmur heard. Pulmonary/Chest: Effort normal and breath sounds normal.  She has no wheezes.  Abdominal: She exhibits no distension and no mass.  Musculoskeletal: She exhibits no edema.  Lymphadenopathy:    She has no cervical adenopathy.  Neurological: She is alert and oriented to person, place, and time.  Skin: Skin is warm and dry. No rash noted. She is not diaphoretic.       Small white papular lesion above  medial aspect of left eyebrow. Small papular lesion on left side of nose, white shiny  Psychiatric: Memory, affect and judgment normal.    Lab Results  Component Value Date   TSH 0.85 07/03/2011   Lab Results  Component Value Date   WBC 5.7 07/03/2011   HGB 10.5* 07/03/2011   HCT 33.1* 07/03/2011   MCV 80.5 07/03/2011   PLT 227.0 07/03/2011   Lab Results  Component Value Date   CREATININE 0.7 07/03/2011   BUN 17 07/03/2011   NA 142 07/03/2011   K 4.3 07/03/2011   CL 104 07/03/2011   CO2 30 07/03/2011   Lab Results  Component Value Date   ALT 14 07/03/2011   AST 25 07/03/2011   ALKPHOS 84 07/03/2011   BILITOT 0.3 07/03/2011   Lab Results  Component Value Date   CHOL 137 07/03/2011   Lab Results  Component Value Date   HDL 75.40 07/03/2011   Lab Results  Component Value Date   LDLCALC 47 07/03/2011   Lab Results  Component Value Date   TRIG 75.0 07/03/2011   Lab Results  Component Value Date   CHOLHDL 2 07/03/2011     Assessment & Plan  Acute sinusitis Improved with Levaquin treatment.  ANEMIA-NOS Mildly worsened encouraged increased leafy greens and red meats, consider a Multivitamin with iron. Reports having low vitamin b 12 in past will check labs to further evaluate at next visit.  ALLERGIC RHINITIS Tolerable symptoms at this time, no change in meds  DEPRESSION Medications are making her symptoms manageable despite 3 teenage daughters at home. No change in meds today  BASAL CELL CARCINOMA SKIN LOWER LIMB INCL HIP Now with 2 new persistent lesions one above medial aspect of left eyebrow and one on left side of nose near a previously removed  BCC.patient is going to establish with dermatology and plans to use the practice she takes her daughter to. New lesion on left side of nose and above left eyebrow  Disorder of phosphorus metabolism Mildly elevated encouraged to minimize phosphorus rich foods and recheck next month  PERIMENOPAUSAL STATUS Underwent hysterectomy several years ago, has not had a bone densitometry thus far. Consider a baseline scan in the fall

## 2011-07-10 NOTE — Assessment & Plan Note (Signed)
Medications are making her symptoms manageable despite 3 teenage daughters at home. No change in meds today

## 2011-07-10 NOTE — Patient Instructions (Addendum)

## 2011-07-10 NOTE — Assessment & Plan Note (Addendum)
Now with 2 new persistent lesions one above medial aspect of left eyebrow and one on left side of nose near a previously removed BCC.patient is going to establish with dermatology and plans to use the practice she takes her daughter to. New lesion on left side of nose and above left eyebrow

## 2011-07-10 NOTE — Assessment & Plan Note (Signed)
Improved with Levaquin treatment.

## 2011-07-10 NOTE — Assessment & Plan Note (Signed)
Underwent hysterectomy several years ago, has not had a bone densitometry thus far. Consider a baseline scan in the fall

## 2011-07-10 NOTE — Assessment & Plan Note (Addendum)
Mildly worsened encouraged increased leafy greens and red meats, consider a Multivitamin with iron. Reports having low vitamin b 12 in past will check labs to further evaluate at next visit.

## 2011-07-13 ENCOUNTER — Other Ambulatory Visit: Payer: Self-pay | Admitting: *Deleted

## 2011-07-13 DIAGNOSIS — G4700 Insomnia, unspecified: Secondary | ICD-10-CM

## 2011-07-13 MED ORDER — ZOLPIDEM TARTRATE 10 MG PO TABS
10.0000 mg | ORAL_TABLET | Freq: Every evening | ORAL | Status: DC | PRN
Start: 2011-07-13 — End: 2011-10-22

## 2011-07-13 NOTE — Telephone Encounter (Signed)
Faxed refill request received from pharmacy for Zolpidem Last filled by MD on 03/12/11, #30 x 3 Last seen on 07/10/11 Follow up in 3 months. Please advise refills.

## 2011-07-13 NOTE — Telephone Encounter (Signed)
RX faxed

## 2011-08-10 ENCOUNTER — Other Ambulatory Visit (INDEPENDENT_AMBULATORY_CARE_PROVIDER_SITE_OTHER): Payer: BC Managed Care – PPO

## 2011-08-10 DIAGNOSIS — D649 Anemia, unspecified: Secondary | ICD-10-CM

## 2011-08-10 LAB — RENAL FUNCTION PANEL
CO2: 26 mEq/L (ref 19–32)
Creatinine, Ser: 0.7 mg/dL (ref 0.4–1.2)
GFR: 93.93 mL/min (ref >60.00)
Glucose, Bld: 149 mg/dL — ABNORMAL HIGH (ref 70–99)
Phosphorus: 3.8 mg/dL (ref 2.3–4.6)
Sodium: 139 mEq/L (ref 135–145)

## 2011-08-10 LAB — CBC
HCT: 34.7 % — ABNORMAL LOW (ref 36.0–46.0)
RBC: 4.28 Mil/uL (ref 3.87–5.11)
WBC: 5.5 10*3/uL (ref 4.5–10.5)

## 2011-08-10 LAB — HOMOCYSTEINE: Homocysteine: 6.3 umol/L (ref 4.0–15.4)

## 2011-08-10 LAB — VITAMIN B12: Vitamin B-12: >1500 pg/mL — ABNORMAL HIGH (ref 211–911)

## 2011-08-14 ENCOUNTER — Telehealth: Payer: Self-pay

## 2011-08-14 DIAGNOSIS — J329 Chronic sinusitis, unspecified: Secondary | ICD-10-CM

## 2011-08-14 LAB — METHYLMALONIC ACID, SERUM: Methylmalonic Acid, Quant: 0.26 umol/L (ref <0.40)

## 2011-08-14 MED ORDER — LEVOFLOXACIN 500 MG PO TABS: 500.0000 mg | ORAL_TABLET | Freq: Every day | ORAL | Status: DC

## 2011-08-14 NOTE — Progress Notes (Signed)
Quick Note:  Patient Informed and voiced understanding ______ 

## 2011-08-14 NOTE — Telephone Encounter (Signed)
Pt informed and RX sent to pharmacy. Pt also informed of her lab results

## 2011-08-14 NOTE — Telephone Encounter (Signed)
She can have a refill on her Levaquin, same sig, same number, no rf but if no improvement needs to come in. If she wants a referral to ent for recurrent sinusitis, I can arrange it.

## 2011-08-14 NOTE — Telephone Encounter (Signed)
Patient called stating MD asked her to call in if these sinus symptoms came back? Pt states she has congestion, green mucus in nose, sore throat, and sinus pressure X 9 days.  Please advise if pt can have an RX or if she needs to be seen?

## 2011-10-03 ENCOUNTER — Other Ambulatory Visit: Payer: Self-pay

## 2011-10-03 DIAGNOSIS — F32A Depression, unspecified: Secondary | ICD-10-CM

## 2011-10-03 DIAGNOSIS — F329 Major depressive disorder, single episode, unspecified: Secondary | ICD-10-CM

## 2011-10-03 MED ORDER — SERTRALINE HCL 100 MG PO TABS: 100.0000 mg | ORAL_TABLET | Freq: Every day | ORAL | Status: DC

## 2011-10-10 ENCOUNTER — Encounter: Payer: Self-pay | Admitting: Family Medicine

## 2011-10-10 ENCOUNTER — Ambulatory Visit (INDEPENDENT_AMBULATORY_CARE_PROVIDER_SITE_OTHER): Payer: BC Managed Care – PPO | Admitting: Family Medicine

## 2011-10-10 VITALS — BP 121/80 | HR 76 | Temp 97.8°F | Ht 62.0 in | Wt 128.1 lb

## 2011-10-10 DIAGNOSIS — R739 Hyperglycemia, unspecified: Secondary | ICD-10-CM

## 2011-10-10 DIAGNOSIS — J019 Acute sinusitis, unspecified: Secondary | ICD-10-CM

## 2011-10-10 DIAGNOSIS — R7309 Other abnormal glucose: Secondary | ICD-10-CM

## 2011-10-10 DIAGNOSIS — J309 Allergic rhinitis, unspecified: Secondary | ICD-10-CM

## 2011-10-10 DIAGNOSIS — F3289 Other specified depressive episodes: Secondary | ICD-10-CM

## 2011-10-10 DIAGNOSIS — D649 Anemia, unspecified: Secondary | ICD-10-CM

## 2011-10-10 DIAGNOSIS — J329 Chronic sinusitis, unspecified: Secondary | ICD-10-CM

## 2011-10-10 DIAGNOSIS — F329 Major depressive disorder, single episode, unspecified: Secondary | ICD-10-CM

## 2011-10-10 LAB — RENAL FUNCTION PANEL
CO2: 30 mEq/L (ref 19–32)
Chloride: 104 mEq/L (ref 96–112)
GFR: 98.82 mL/min (ref >60.00)
Phosphorus: 3.4 mg/dL (ref 2.3–4.6)
Potassium: 4.3 mEq/L (ref 3.5–5.1)
Sodium: 141 mEq/L (ref 135–145)

## 2011-10-10 LAB — CBC
RDW: 16.2 % — ABNORMAL HIGH (ref 11.5–14.6)
WBC: 7.1 10*3/uL (ref 4.5–10.5)

## 2011-10-10 MED ORDER — FLUTICASONE PROPIONATE 50 MCG/ACT NA SUSP: 1.0000 | Freq: Every day | NASAL | Status: DC | PRN

## 2011-10-10 MED ORDER — MONTELUKAST SODIUM 10 MG PO TABS: 10.0000 mg | ORAL_TABLET | Freq: Every day | ORAL | Status: DC

## 2011-10-10 NOTE — Progress Notes (Signed)
Patient ID: Jennifer Sandoval, female   DOB: [birth year], 56 y.o.   MRN: [id number] Jennifer Sandoval [id number] [birth year] 10/10/2011      Progress Note-Follow Up  Subjective  Chief Complaint  Chief Complaint  Patient presents with  . Follow-up    3 month    HPI  56year-old Caucasian female who is in today for followup. Presently her sinuses are not infected and her last round of Levaquin did help her sinuses but unfortunately she realizes did cause arthralgias. The worst pain from her shoulders when she stopped the Levaquin he improved again. Her seasonal allergies and has started to flare up she is taking her Zyrtec daily but he notes it is struggling with some nasal congestion, sneezing and irritation. She denies any fevers or chills, significant thick green sputum. No ear pain or throat although she does have some postnasal drip. No chest pain no palpitations, shortness or breath, GI or GU complaints noted today. She does still struggle some anhedonia and low grade depression but feels it is stable in some days are better than others. No suicidal ideation and she does not feel as if she would like to change medications at this time.  Past Medical History  Diagnosis Date  . Allergy     rhinitis  . Anemia     iron deficeincy  . Depression   . Lumbar compression fracture     nonsurgical  . SHOULDER PAIN, BILATERAL 10/10/2007  . PERIMENOPAUSAL STATUS 03/16/2010  . OSTEOARTHRITIS 10/24/2006  . Leiomyoma of uterus, unspecified 02/15/2010  . FRACTURE, NOSE 10/22/2007  . DEPRESSION 10/24/2006  . BASAL CELL CARCINOMA SKIN LOWER LIMB INCL HIP 02/15/2010  . ANEMIA-NOS 10/24/2006  . ALLERGIC RHINITIS 08/15/2006  . Dyspepsia 04/19/2010  . Acute sinusitis 07/31/2006  . BCC (basal cell carcinoma of skin) 07/10/2011    h/o  . 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    Past Surgical History  Procedure Date  . Septoplasty   . Gastric bypass   . Tubal ligation   . Nasal sinus surgery   .  Cesarean section     X 3  . Appendectomy   . Cholecystectomy   . Ventral herniorrhaphy     X 2  . Abdominal hysterectomy 2009    partial, ovaries left in place, for painful, heavy menstrrual bleeding secondary to anemia and fibroids  . Skin biopsy     L hip BCC, facial lesions were benign    Family History  Problem Relation Age of Onset  . Cholelithiasis Mother   . Other Mother     Urinary incontince/ bladder prolapse/ h/o smoking  . Cancer Mother     laryngeal carcinoma  . Allergies Mother   . Other Father     Renal failure  . Hypertension Father   . Coronary artery disease Father     s/p bypass  . Aortic aneurysm Father   . Hyperlipidemia Father   . Heart disease Father   . Spina bifida Brother     with stunt/ self cath  . Seizures Brother     disorders  . ADD / ADHD Daughter   . Cancer Maternal Grandmother     colon/ smoker  . Cancer Maternal Grandfather     lung  . Cancer Paternal Grandmother     Breast  . Heart disease Paternal Grandfather   . Stroke Paternal Grandfather   . ADD / ADHD Daughter     History   Social History  . Marital  Status: Married    Spouse Name: N/A    Number of Children: N/A  . Years of Education: N/A   Occupational History  . Not on file.   Social History Main Topics  . Smoking status: Never Smoker   . Smokeless tobacco: Never Used  . Alcohol Use: Yes     special occasion  . Drug Use: No  . Sexually Active: Yes -- Female partner(s)   Other Topics Concern  . Not on file   Social History Narrative  . No narrative on file    Current Outpatient Prescriptions on File Prior to Visit  Medication Sig Dispense Refill  . Biotin 10 MG CAPS Take 1 tablet by mouth daily.      . cetirizine (ZYRTEC) 10 MG tablet 1 tab daily, may take a second any given day as needed allergies  60 tablet  1  . sertraline (ZOLOFT) 100 MG tablet Take 1 tablet (100 mg total) by mouth daily.  30 tablet  3  . vitamin B-12 (CYANOCOBALAMIN) 1000 MCG tablet  Take 1,000 mcg by mouth daily.        Marland Kitchen zolpidem (AMBIEN) 10 MG tablet Take 1 tablet (10 mg total) by mouth at bedtime as needed.  30 tablet  3  . LORazepam (ATIVAN) 0.5 MG tablet Take 1 tablet (0.5 mg total) by mouth 2 (two) times daily as needed for anxiety.  10 tablet  0  . montelukast (SINGULAIR) 10 MG tablet Take 1 tablet (10 mg total) by mouth at bedtime.  30 tablet  3   Current Facility-Administered Medications on File Prior to Visit  Medication Dose Route Frequency Provider Last Rate Last Dose  . 0.9 %  sodium chloride infusion  500 mL Intravenous Continuous Mardella Layman, MD        Allergies  Allergen Reactions  . Itraconazole     REACTION: Rash  . Levaquin (Levofloxacin In D5w)     Joint pain    Review of Systems  Review of Systems  Constitutional: Negative for fever and malaise/fatigue.  HENT: Positive for congestion.   Eyes: Negative for discharge.  Respiratory: Positive for sputum production. Negative for shortness of breath.   Cardiovascular: Negative for chest pain, palpitations and leg swelling.  Gastrointestinal: Negative for nausea, abdominal pain and diarrhea.  Genitourinary: Negative for dysuria.  Musculoskeletal: Negative for falls.  Skin: Negative for rash.  Neurological: Negative for loss of consciousness and headaches.  Endo/Heme/Allergies: Negative for polydipsia.  Psychiatric/Behavioral: Positive for depression. Negative for suicidal ideas. The patient is not nervous/anxious and does not have insomnia.     Objective  BP 121/80  Pulse 76  Temp 97.8 F (36.6 C) (Temporal)  Ht 5\' 2"  (1.575 m)  Wt 128 lb 1.9 oz (58.115 kg)  BMI 23.43 kg/m2  SpO2 99%  Physical Exam  Physical Exam  Constitutional: She is oriented to person, place, and time and well-developed, well-nourished, and in no distress. No distress.  HENT:  Head: Normocephalic and atraumatic.  Eyes: Conjunctivae normal are normal.  Neck: Neck supple. No thyromegaly present.    Cardiovascular: Normal rate, regular rhythm and normal heart sounds.   No murmur heard. Pulmonary/Chest: Effort normal and breath sounds normal. She has no wheezes.  Abdominal: She exhibits no distension and no mass.  Musculoskeletal: She exhibits no edema.  Lymphadenopathy:    She has no cervical adenopathy.  Neurological: She is alert and oriented to person, place, and time.  Skin: Skin is warm and dry. No  rash noted. She is not diaphoretic.  Psychiatric: Memory, affect and judgment normal.    Lab Results  Component Value Date   TSH 0.85 07/03/2011   Lab Results  Component Value Date   WBC 5.5 08/10/2011   HGB 11.0* 08/10/2011   HCT 34.7* 08/10/2011   MCV 80.9 08/10/2011   PLT 236.0 08/10/2011   Lab Results  Component Value Date   CREATININE 0.7 08/10/2011   BUN 13 08/10/2011   NA 139 08/10/2011   K 3.7 08/10/2011   CL 104 08/10/2011   CO2 26 08/10/2011   Lab Results  Component Value Date   ALT 14 07/03/2011   AST 25 07/03/2011   ALKPHOS 84 07/03/2011   BILITOT 0.3 07/03/2011   Lab Results  Component Value Date   CHOL 137 07/03/2011   Lab Results  Component Value Date   HDL 75.40 07/03/2011   Lab Results  Component Value Date   LDLCALC 47 07/03/2011   Lab Results  Component Value Date   TRIG 75.0 07/03/2011   Lab Results  Component Value Date   CHOLHDL 2 07/03/2011     Assessment & Plan  ANEMIA-NOS Mild, will repeat CBC today  Acute sinusitis Doing well at present, she realized after her last round that the Levaquin increases her joint pain especially in her shoulders. We will try Augmentin for her next sinusitis  ALLERGIC RHINITIS Allergies starting to flare, encouraged antihistamines bid for now, nasal saline bid and restart Flonase, if no response given an rx for Singulair to try.  Disorder of phosphorus metabolism Hi phosphorus resolved, patient encouraged to avoid soda and we will recheck today  DEPRESSION Patient feels she is stable on her current medications we will not  make any changes at this time

## 2011-10-10 NOTE — Assessment & Plan Note (Signed)
Allergies starting to flare, encouraged antihistamines bid for now, nasal saline bid and restart Flonase, if no response given an rx for Singulair to try.

## 2011-10-10 NOTE — Assessment & Plan Note (Signed)
Doing well at present, she realized after her last round that the Levaquin increases her joint pain especially in her shoulders. We will try Augmentin for her next sinusitis

## 2011-10-10 NOTE — Assessment & Plan Note (Signed)
Hi phosphorus resolved, patient encouraged to avoid soda and we will recheck today

## 2011-10-10 NOTE — Assessment & Plan Note (Signed)
Patient feels she is stable on her current medications we will not make any changes at this time

## 2011-10-10 NOTE — Assessment & Plan Note (Signed)
Mild, will repeat CBC today 

## 2011-10-10 NOTE — Patient Instructions (Addendum)

## 2011-10-22 ENCOUNTER — Other Ambulatory Visit: Payer: Self-pay

## 2011-10-22 DIAGNOSIS — G4700 Insomnia, unspecified: Secondary | ICD-10-CM

## 2011-10-22 MED ORDER — ZOLPIDEM TARTRATE 10 MG PO TABS: 10.0000 mg | ORAL_TABLET | Freq: Every evening | ORAL | Status: DC | PRN

## 2011-10-22 NOTE — Telephone Encounter (Signed)
Please advise? Last RX wrote on 07-13-11 quantity 30 with 3 refills  If ok fax to [phone]

## 2011-10-22 NOTE — Telephone Encounter (Signed)
RX faxed

## 2011-10-30 ENCOUNTER — Telehealth: Payer: Self-pay

## 2011-10-30 MED ORDER — AMOXICILLIN-POT CLAVULANATE 875-125 MG PO TABS: 1.0000 | ORAL_TABLET | Freq: Two times a day (BID) | ORAL | Status: DC

## 2011-10-30 NOTE — Telephone Encounter (Signed)
Patient informed and RX sent.  

## 2011-10-30 NOTE — Telephone Encounter (Signed)
Patient left a message stating that when she was in last she had redness in her ear and throat was a "little" sore? Pt stated that MD told pt that if they got worse to call and an antibiotic would be called in? Pt stated that her Right ear is hurting and her throat is red and scratchy. Pt would like something called into her pharmacy. Please advise?

## 2011-10-30 NOTE — Telephone Encounter (Signed)
OK to send in Augmentin 875 mg po bid x 10 days. We discussed this med at her last visit

## 2011-11-26 ENCOUNTER — Other Ambulatory Visit: Payer: Self-pay

## 2011-11-26 DIAGNOSIS — F329 Major depressive disorder, single episode, unspecified: Secondary | ICD-10-CM

## 2011-11-26 DIAGNOSIS — F32A Depression, unspecified: Secondary | ICD-10-CM

## 2011-11-26 MED ORDER — SERTRALINE HCL 100 MG PO TABS: 100.0000 mg | ORAL_TABLET | Freq: Every day | ORAL | Status: DC

## 2011-11-30 ENCOUNTER — Ambulatory Visit (INDEPENDENT_AMBULATORY_CARE_PROVIDER_SITE_OTHER): Payer: BC Managed Care – PPO | Admitting: Family Medicine

## 2011-11-30 ENCOUNTER — Encounter: Payer: Self-pay | Admitting: Family Medicine

## 2011-11-30 VITALS — BP 127/81 | HR 81 | Temp 98.7°F | Ht 62.0 in | Wt 127.8 lb

## 2011-11-30 DIAGNOSIS — G4700 Insomnia, unspecified: Secondary | ICD-10-CM

## 2011-11-30 DIAGNOSIS — B379 Candidiasis, unspecified: Secondary | ICD-10-CM

## 2011-11-30 DIAGNOSIS — J019 Acute sinusitis, unspecified: Secondary | ICD-10-CM

## 2011-11-30 DIAGNOSIS — J329 Chronic sinusitis, unspecified: Secondary | ICD-10-CM

## 2011-11-30 MED ORDER — AMOXICILLIN-POT CLAVULANATE 875-125 MG PO TABS: 1.0000 | ORAL_TABLET | Freq: Two times a day (BID) | ORAL | Status: DC

## 2011-11-30 MED ORDER — FLUCONAZOLE 150 MG PO TABS: 150.0000 mg | ORAL_TABLET | Freq: Once | ORAL | Status: DC

## 2011-11-30 MED ORDER — ALPRAZOLAM 0.25 MG PO TABS: 0.2500 mg | ORAL_TABLET | Freq: Every evening | ORAL | Status: DC | PRN

## 2011-11-30 MED ORDER — GUAIFENESIN ER 600 MG PO TB12: 600.0000 mg | ORAL_TABLET | Freq: Two times a day (BID) | ORAL | Status: DC

## 2011-11-30 NOTE — Assessment & Plan Note (Addendum)
Augmentin bid and continue probiotic and increase hydration and rest. Call if no improvement, use Mucinex bid. Struggles with secondary yeast infections, will give an rx for diflucan 150 mg q week x 2 weeks to use as needed

## 2011-11-30 NOTE — Patient Instructions (Addendum)

## 2011-11-30 NOTE — Progress Notes (Signed)
Patient ID: Jennifer Sandoval, female   DOB: [birth year], 56 y.o.   MRN: [id number] Jennifer Sandoval [id number] [birth year] 11/30/2011      Progress Note-Follow Up  Subjective  Chief Complaint  Chief Complaint  Patient presents with  . Sinusitis    X 1 week - sinus pressure, sore throat    HPI  Patient is a 56 year old Caucasian female who is in today for evaluation of multiple respiratory symptoms. She's been struggling with a sore throat, malaise and weakness. His sinus pressure as well as some right-sided ear pain. Some low-grade fevers and a mild cough productive of some green phlegm. She denies any high-grade chills malaise, chest pain, palpitations, shortness of breath or other GI or GU complaints. She says her symptoms started first with a sore throat and have progressed since the  Past Medical History  Diagnosis Date  . Allergy     rhinitis  . Anemia     iron deficeincy  . Depression   . Lumbar compression fracture     nonsurgical  . SHOULDER PAIN, BILATERAL 10/10/2007  . PERIMENOPAUSAL STATUS 03/16/2010  . OSTEOARTHRITIS 10/24/2006  . Leiomyoma of uterus, unspecified 02/15/2010  . FRACTURE, NOSE 10/22/2007  . DEPRESSION 10/24/2006  . BASAL CELL CARCINOMA SKIN LOWER LIMB INCL HIP 02/15/2010  . ANEMIA-NOS 10/24/2006  . ALLERGIC RHINITIS 08/15/2006  . Dyspepsia 04/19/2010  . Acute sinusitis 07/31/2006  . BCC (basal cell carcinoma of skin) 07/10/2011    h/o  . 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    Past Surgical History  Procedure Date  . Septoplasty   . Gastric bypass   . Tubal ligation   . Nasal sinus surgery   . Cesarean section     X 3  . Appendectomy   . Cholecystectomy   . Ventral herniorrhaphy     X 2  . Abdominal hysterectomy 2009    partial, ovaries left in place, for painful, heavy menstrrual bleeding secondary to anemia and fibroids  . Skin biopsy     L hip BCC, facial lesions were benign    Family History  Problem Relation Age of Onset  .  Cholelithiasis Mother   . Other Mother     Urinary incontince/ bladder prolapse/ h/o smoking  . Cancer Mother     laryngeal carcinoma  . Allergies Mother   . Other Father     Renal failure  . Hypertension Father   . Coronary artery disease Father     s/p bypass  . Aortic aneurysm Father   . Hyperlipidemia Father   . Heart disease Father   . Spina bifida Brother     with stunt/ self cath  . Seizures Brother     disorders  . ADD / ADHD Daughter   . Cancer Maternal Grandmother     colon/ smoker  . Cancer Maternal Grandfather     lung  . Cancer Paternal Grandmother     Breast  . Heart disease Paternal Grandfather   . Stroke Paternal Grandfather   . ADD / ADHD Daughter     History   Social History  . Marital Status: Married    Spouse Name: N/A    Number of Children: N/A  . Years of Education: N/A   Occupational History  . Not on file.   Social History Main Topics  . Smoking status: Never Smoker   . Smokeless tobacco: Never Used  . Alcohol Use: Yes     Comment: special occasion  . Drug  Use: No  . Sexually Active: Yes -- Female partner(s)   Other Topics Concern  . Not on file   Social History Narrative  . No narrative on file    Current Outpatient Prescriptions on File Prior to Visit  Medication Sig Dispense Refill  . Biotin 10 MG CAPS Take 1 tablet by mouth daily.      . Multiple Vitamin (MULTIVITAMIN) tablet Take 1 tablet by mouth daily.      . sertraline (ZOLOFT) 100 MG tablet Take 1 tablet (100 mg total) by mouth daily.  30 tablet  3  . vitamin B-12 (CYANOCOBALAMIN) 1000 MCG tablet Take 1,000 mcg by mouth daily.        Marland Kitchen zolpidem (AMBIEN) 10 MG tablet Take 1 tablet (10 mg total) by mouth at bedtime as needed.  30 tablet  3  . cetirizine (ZYRTEC) 10 MG tablet 1 tab daily, may take a second any given day as needed allergies  60 tablet  1  . fluticasone (FLONASE) 50 MCG/ACT nasal spray Place 1 spray into the nose daily as needed for rhinitis or allergies (1  spray each nostril daily). For allergies  16 g  3   Current Facility-Administered Medications on File Prior to Visit  Medication Dose Route Frequency Provider Last Rate Last Dose  . 0.9 %  sodium chloride infusion  500 mL Intravenous Continuous Mardella Layman, MD        Allergies  Allergen Reactions  . Itraconazole     REACTION: Rash  . Levaquin (Levofloxacin In D5w)     Joint pain    Review of Systems  Review of Systems  Constitutional: Positive for malaise/fatigue. Negative for fever and chills.  HENT: Positive for ear pain, congestion and sore throat.   Eyes: Negative for discharge.  Respiratory: Positive for cough and sputum production. Negative for shortness of breath.   Cardiovascular: Negative for chest pain, palpitations and leg swelling.  Gastrointestinal: Negative for nausea, abdominal pain and diarrhea.  Genitourinary: Negative for dysuria.  Musculoskeletal: Negative for falls.  Skin: Negative for rash.  Neurological: Positive for headaches. Negative for loss of consciousness.  Endo/Heme/Allergies: Negative for polydipsia.  Psychiatric/Behavioral: Negative for depression and suicidal ideas. The patient is not nervous/anxious and does not have insomnia.     Objective  BP 127/81  Pulse 81  Temp 98.7 F (37.1 C) (Temporal)  Ht 5\' 2"  (1.575 m)  Wt 127 lb 12.8 oz (57.97 kg)  BMI 23.38 kg/m2  SpO2 97%  Physical Exam  Physical Exam  Constitutional: She is oriented to person, place, and time and well-developed, well-nourished, and in no distress. No distress.  HENT:  Head: Normocephalic and atraumatic.       Oropharynx with erythema no exudate, patch on right side excessively erythematous  Eyes: Conjunctivae normal are normal.  Neck: Neck supple. No thyromegaly present.  Cardiovascular: Normal rate, regular rhythm and normal heart sounds.   No murmur heard. Pulmonary/Chest: Effort normal and breath sounds normal. She has no wheezes.  Abdominal: She  exhibits no distension and no mass.  Musculoskeletal: She exhibits no edema.  Lymphadenopathy:    She has cervical adenopathy.  Neurological: She is alert and oriented to person, place, and time.  Skin: Skin is warm and dry. No rash noted. She is not diaphoretic.  Psychiatric: Memory, affect and judgment normal.    Lab Results  Component Value Date   TSH 0.85 07/03/2011   Lab Results  Component Value Date   WBC 7.1  10/10/2011   HGB 11.4* 10/10/2011   HCT 36.5 10/10/2011   MCV 82.5 10/10/2011   PLT 270.0 10/10/2011   Lab Results  Component Value Date   CREATININE 0.7 10/10/2011   BUN 14 10/10/2011   NA 141 10/10/2011   K 4.3 10/10/2011   CL 104 10/10/2011   CO2 30 10/10/2011   Lab Results  Component Value Date   ALT 14 07/03/2011   AST 25 07/03/2011   ALKPHOS 84 07/03/2011   BILITOT 0.3 07/03/2011   Lab Results  Component Value Date   CHOL 137 07/03/2011   Lab Results  Component Value Date   HDL 75.40 07/03/2011   Lab Results  Component Value Date   LDLCALC 47 07/03/2011   Lab Results  Component Value Date   TRIG 75.0 07/03/2011   Lab Results  Component Value Date   CHOLHDL 2 07/03/2011     Assessment & Plan  Acute sinusitis Augmentin bid and continue probiotic and increase hydration and rest. Call if no improvement, use Mucinex bid. Struggles with secondary yeast infections, will give an rx for diflucan 150 mg q week x 2 weeks to use as needed

## 2011-12-05 ENCOUNTER — Telehealth: Payer: Self-pay | Admitting: Family Medicine

## 2011-12-05 DIAGNOSIS — J329 Chronic sinusitis, unspecified: Secondary | ICD-10-CM

## 2011-12-05 MED ORDER — FLUCONAZOLE 150 MG PO TABS: 150.0000 mg | ORAL_TABLET | Freq: Once | ORAL | Status: DC

## 2011-12-05 NOTE — Telephone Encounter (Signed)
Please advise 

## 2011-12-05 NOTE — Telephone Encounter (Signed)
Patient accidentally threw away her diflucan. Please send in new Rx

## 2012-01-09 ENCOUNTER — Other Ambulatory Visit: Payer: Self-pay

## 2012-01-09 MED ORDER — ALPRAZOLAM 0.5 MG PO TABS: 0.5000 mg | ORAL_TABLET | Freq: Every evening | ORAL | Status: DC | PRN

## 2012-01-09 NOTE — Telephone Encounter (Signed)
Pt left a message stating that she needs a refill on her Xanax. Pt did state she was taking the xanax at night in place of the Ambien, but she has to take 2 nightly. Pt would like to have double the quantity. Please advise?

## 2012-01-09 NOTE — Telephone Encounter (Signed)
Just double the strength, increase her to Alprazolam 0.5 mg tabs 1 tab po daily prn anxiety or insomnia and if she ever needs less she can split in half. Disp #30 with 2 rf

## 2012-01-09 NOTE — Telephone Encounter (Signed)
Pt informed and RX sent 

## 2012-01-23 ENCOUNTER — Ambulatory Visit (INDEPENDENT_AMBULATORY_CARE_PROVIDER_SITE_OTHER): Payer: BC Managed Care – PPO | Admitting: Family Medicine

## 2012-01-23 ENCOUNTER — Encounter: Payer: Self-pay | Admitting: Family Medicine

## 2012-01-23 VITALS — BP 111/68 | HR 85 | Temp 98.0°F | Ht 65.0 in | Wt 130.0 lb

## 2012-01-23 DIAGNOSIS — F3289 Other specified depressive episodes: Secondary | ICD-10-CM

## 2012-01-23 DIAGNOSIS — F329 Major depressive disorder, single episode, unspecified: Secondary | ICD-10-CM

## 2012-01-23 DIAGNOSIS — D649 Anemia, unspecified: Secondary | ICD-10-CM

## 2012-01-23 DIAGNOSIS — J019 Acute sinusitis, unspecified: Secondary | ICD-10-CM

## 2012-01-23 DIAGNOSIS — G4700 Insomnia, unspecified: Secondary | ICD-10-CM

## 2012-01-23 HISTORY — DX: Insomnia, unspecified: G47.00

## 2012-01-23 MED ORDER — LORAZEPAM 0.5 MG PO TABS: ORAL_TABLET | ORAL | Status: DC

## 2012-01-23 NOTE — Assessment & Plan Note (Signed)
Doing well on current dose of Sertraline, was using Ambien for sleep but felt it was making her depression worse, since stopping it she does feel better.

## 2012-01-23 NOTE — Assessment & Plan Note (Signed)
Resolved

## 2012-01-23 NOTE — Progress Notes (Signed)
Patient ID: Jennifer Sandoval, female   DOB: Apr 07, 1955, 57 y.o.   MRN: [id number] Jennifer Sandoval [id number] [birth year] 01/23/2012      Progress Note-Follow Up  Subjective  Chief Complaint  Chief Complaint  Patient presents with  . Follow-up    medication refill    HPI  Patient is a 57 year old Caucasian female who is here today in followup. Her sinusitis has resolved. She has persistent but mild nasal congestion. Denies headache, fevers, chills, rhinorrhea, sore throat. No chest pain, palpitations, shortness of breath, GI or GU complaints. Her depression and anxiety are somewhat improved since she stopped Ambien. Alprazolam is helping her to fall sleep but not stay asleep Past Medical History  Diagnosis Date  . Allergy     rhinitis  . Anemia     iron deficeincy  . Depression   . Lumbar compression fracture     nonsurgical  . SHOULDER PAIN, BILATERAL 10/10/2007  . PERIMENOPAUSAL STATUS 03/16/2010  . OSTEOARTHRITIS 10/24/2006  . Leiomyoma of uterus, unspecified 02/15/2010  . FRACTURE, NOSE 10/22/2007  . DEPRESSION 10/24/2006  . BASAL CELL CARCINOMA SKIN LOWER LIMB INCL HIP 02/15/2010  . ANEMIA-NOS 10/24/2006  . ALLERGIC RHINITIS 08/15/2006  . Dyspepsia 04/19/2010  . Acute sinusitis 07/31/2006  . BCC (basal cell carcinoma of skin) 07/10/2011    h/o  . 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 Insomnia 01/23/2012    Past Surgical History  Procedure Date  . Septoplasty   . Gastric bypass   . Tubal ligation   . Nasal sinus surgery   . Cesarean section     X 3  . Appendectomy   . Cholecystectomy   . Ventral herniorrhaphy     X 2  . Abdominal hysterectomy 2009    partial, ovaries left in place, for painful, heavy menstrrual bleeding secondary to anemia and fibroids  . Skin biopsy     L hip BCC, facial lesions were benign    Family History  Problem Relation Age of Onset  . Cholelithiasis Mother   . Other Mother     Urinary incontince/ bladder prolapse/ h/o  smoking  . Cancer Mother     laryngeal carcinoma  . Allergies Mother   . Other Father     Renal failure  . Hypertension Father   . Coronary artery disease Father     s/p bypass  . Aortic aneurysm Father   . Hyperlipidemia Father   . Heart disease Father   . Spina bifida Brother     with stunt/ self cath  . Seizures Brother     disorders  . ADD / ADHD Daughter   . Cancer Maternal Grandmother     colon/ smoker  . Cancer Maternal Grandfather     lung  . Cancer Paternal Grandmother     Breast  . Heart disease Paternal Grandfather   . Stroke Paternal Grandfather   . ADD / ADHD Daughter     History   Social History  . Marital Status: Married    Spouse Name: N/A    Number of Children: N/A  . Years of Education: N/A   Occupational History  . Not on file.   Social History Main Topics  . Smoking status: Never Smoker   . Smokeless tobacco: Never Used  . Alcohol Use: Yes     Comment: special occasion  . Drug Use: No  . Sexually Active: Yes -- Female partner(s)   Other Topics Concern  . Not on file  Social History Narrative  . No narrative on file    Current Outpatient Prescriptions on File Prior to Visit  Medication Sig Dispense Refill  . Biotin 10 MG CAPS Take 1 tablet by mouth daily.      . cetirizine (ZYRTEC) 10 MG tablet 1 tab daily, may take a second any given day as needed allergies  60 tablet  1  . guaiFENesin (MUCINEX) 600 MG 12 hr tablet Take 1 tablet (600 mg total) by mouth 2 (two) times daily.  20 tablet  0  . Multiple Vitamin (MULTIVITAMIN) tablet Take 1 tablet by mouth daily.      . sertraline (ZOLOFT) 100 MG tablet Take 1 tablet (100 mg total) by mouth daily.  30 tablet  3  . vitamin B-12 (CYANOCOBALAMIN) 1000 MCG tablet Take 1,000 mcg by mouth daily.        . fluticasone (FLONASE) 50 MCG/ACT nasal spray Place 1 spray into the nose daily as needed for rhinitis or allergies (1 spray each nostril daily). For allergies  16 g  3   Current  Facility-Administered Medications on File Prior to Visit  Medication Dose Route Frequency Provider Last Rate Last Dose  . 0.9 %  sodium chloride infusion  500 mL Intravenous Continuous Mardella Layman, MD        Allergies  Allergen Reactions  . Itraconazole     REACTION: Rash  . Levaquin (Levofloxacin In D5w)     Joint pain    Review of Systems  Review of Systems  Constitutional: Negative for fever and malaise/fatigue.  HENT: Positive for congestion. Negative for sore throat.   Eyes: Negative for discharge.  Respiratory: Negative for cough and shortness of breath.   Cardiovascular: Negative for chest pain, palpitations and leg swelling.  Gastrointestinal: Negative for nausea, abdominal pain and diarrhea.  Genitourinary: Negative for dysuria.  Musculoskeletal: Negative for falls.  Skin: Negative for rash.  Neurological: Negative for loss of consciousness and headaches.  Endo/Heme/Allergies: Negative for polydipsia.  Psychiatric/Behavioral: Positive for depression. Negative for suicidal ideas. The patient has insomnia. The patient is not nervous/anxious.     Objective  BP 111/68  Pulse 85  Temp 98 F (36.7 C) (Temporal)  Ht 5\' 5"  (1.651 m)  Wt 130 lb (58.968 kg)  BMI 21.63 kg/m2  SpO2 98%  Physical Exam  Physical Exam  Constitutional: She is oriented to person, place, and time and well-developed, well-nourished, and in no distress. No distress.  HENT:  Head: Normocephalic and atraumatic.  Eyes: Conjunctivae normal are normal.  Neck: Neck supple. No thyromegaly present.  Cardiovascular: Normal rate, regular rhythm and normal heart sounds.   No murmur heard. Pulmonary/Chest: Effort normal and breath sounds normal. She has no wheezes.  Abdominal: She exhibits no distension and no mass.  Musculoskeletal: She exhibits no edema.  Lymphadenopathy:    She has no cervical adenopathy.  Neurological: She is alert and oriented to person, place, and time.  Skin: Skin is  warm and dry. No rash noted. She is not diaphoretic.  Psychiatric: Memory, affect and judgment normal.    Lab Results  Component Value Date   TSH 0.85 07/03/2011   Lab Results  Component Value Date   WBC 7.1 10/10/2011   HGB 11.4* 10/10/2011   HCT 36.5 10/10/2011   MCV 82.5 10/10/2011   PLT 270.0 10/10/2011   Lab Results  Component Value Date   CREATININE 0.7 10/10/2011   BUN 14 10/10/2011   NA 141 10/10/2011   K  4.3 10/10/2011   CL 104 10/10/2011   CO2 30 10/10/2011   Lab Results  Component Value Date   ALT 14 07/03/2011   AST 25 07/03/2011   ALKPHOS 84 07/03/2011   BILITOT 0.3 07/03/2011   Lab Results  Component Value Date   CHOL 137 07/03/2011   Lab Results  Component Value Date   HDL 75.40 07/03/2011   Lab Results  Component Value Date   LDLCALC 47 07/03/2011   Lab Results  Component Value Date   TRIG 75.0 07/03/2011   Lab Results  Component Value Date   CHOLHDL 2 07/03/2011     Assessment & Plan  Acute sinusitis Resolved   DEPRESSION Doing well on current dose of Sertraline, was using Ambien for sleep but felt it was making her depression worse, since stopping it she does feel better.  Insomnia Alprazolam does not keep her asleep the way the Ambien did but she feels better. Will try Lorazepam to see if she can sleep a bit longer. She will let us know which one works better.   ANEMIA-NOS Mild, improving on last check, recheck at next visit, increase iron intake

## 2012-01-23 NOTE — Patient Instructions (Addendum)

## 2012-01-23 NOTE — Assessment & Plan Note (Signed)
Mild, improving on last check, recheck at next visit, increase iron intake

## 2012-01-23 NOTE — Assessment & Plan Note (Signed)
Alprazolam does not keep her asleep the way the Ambien did but she feels better. Will try Lorazepam to see if she can sleep a bit longer. She will let us know which one works better.

## 2012-02-16 ENCOUNTER — Other Ambulatory Visit: Payer: Self-pay

## 2012-03-13 ENCOUNTER — Telehealth: Payer: Self-pay | Admitting: Family Medicine

## 2012-03-13 MED ORDER — LORAZEPAM 1 MG PO TABS: 1.0000 mg | ORAL_TABLET | Freq: Every evening | ORAL | Status: DC | PRN

## 2012-03-13 NOTE — Telephone Encounter (Signed)
OK to refill if she is always taking 2 we can change the strength to 1mg  and let her take just one qhs, disp #30 with 1 rf

## 2012-03-13 NOTE — Telephone Encounter (Signed)
refill-lorazepam 0.5mg  tablet. take 1-2 tablets by mouth at bedtime as needed for insomnia.

## 2012-03-13 NOTE — Telephone Encounter (Signed)
RX sent. Pt states she is taking 2 tabs at night. I changed RX

## 2012-03-13 NOTE — Telephone Encounter (Signed)
Please advise refill? Last RX wrote on 01-23-12 quantity 45 with 1 refill  If ok fax to [phone]

## 2012-03-28 ENCOUNTER — Other Ambulatory Visit: Payer: Self-pay

## 2012-03-28 DIAGNOSIS — F329 Major depressive disorder, single episode, unspecified: Secondary | ICD-10-CM

## 2012-03-28 DIAGNOSIS — F32A Depression, unspecified: Secondary | ICD-10-CM

## 2012-03-28 MED ORDER — SERTRALINE HCL 100 MG PO TABS: 100.0000 mg | ORAL_TABLET | Freq: Every day | ORAL | Status: DC

## 2012-04-14 ENCOUNTER — Other Ambulatory Visit: Payer: Self-pay | Admitting: Family Medicine

## 2012-04-14 NOTE — Telephone Encounter (Signed)
OK to send #30 no refills.  

## 2012-04-14 NOTE — Telephone Encounter (Signed)
Please advise Xanax refill? Last RX was wrote on 01-09-12 quantity 30 with 2 refills.   If ok fax to [phone]

## 2012-05-22 ENCOUNTER — Encounter: Payer: Self-pay | Admitting: Family Medicine

## 2012-05-22 ENCOUNTER — Ambulatory Visit (INDEPENDENT_AMBULATORY_CARE_PROVIDER_SITE_OTHER): Payer: BC Managed Care – PPO | Admitting: Family Medicine

## 2012-05-22 VITALS — BP 118/78 | HR 85 | Temp 99.1°F | Ht 62.0 in | Wt 129.0 lb

## 2012-05-22 DIAGNOSIS — F329 Major depressive disorder, single episode, unspecified: Secondary | ICD-10-CM

## 2012-05-22 DIAGNOSIS — F32A Depression, unspecified: Secondary | ICD-10-CM

## 2012-05-22 DIAGNOSIS — F3289 Other specified depressive episodes: Secondary | ICD-10-CM

## 2012-05-22 DIAGNOSIS — J019 Acute sinusitis, unspecified: Secondary | ICD-10-CM

## 2012-05-22 DIAGNOSIS — D649 Anemia, unspecified: Secondary | ICD-10-CM

## 2012-05-22 DIAGNOSIS — G4700 Insomnia, unspecified: Secondary | ICD-10-CM

## 2012-05-22 MED ORDER — AMITRIPTYLINE HCL 10 MG PO TABS: ORAL_TABLET | ORAL | Status: DC

## 2012-05-22 NOTE — Assessment & Plan Note (Signed)
Still struggling, will d/c Benzodiazepines and try adding Eleavil 10 mg qhs, may titrate up to 20 mg if no response can call for increase to 30 mg and then 50 mg as tolerated. Return if any concerns, advised regarding potential SE

## 2012-05-22 NOTE — Patient Instructions (Addendum)
Melatonin 2-5 mg qhs  Next visit annual exam with paps labs prior to visit, liver, renal, tsh, cbc, lipids  Insomnia Insomnia is frequent trouble falling and/or staying asleep. Insomnia can be a long term problem or a short term problem. Both are common. Insomnia can be a short term problem when the wakefulness is related to a certain stress or worry. Long term insomnia is often related to ongoing stress during waking hours and/or poor sleeping habits. Overtime, sleep deprivation itself can make the problem worse. Every little thing feels more severe because you are overtired and your ability to cope is decreased. CAUSES   Stress, anxiety, and depression.  Poor sleeping habits.  Distractions such as TV in the bedroom.  Naps close to bedtime.  Engaging in emotionally charged conversations before bed.  Technical reading before sleep.  Alcohol and other sedatives. They may make the problem worse. They can hurt normal sleep patterns and normal dream activity.  Stimulants such as caffeine for several hours prior to bedtime.  Pain syndromes and shortness of breath can cause insomnia.  Exercise late at night.  Changing time zones may cause sleeping problems (jet lag). It is sometimes helpful to have someone observe your sleeping patterns. They should look for periods of not breathing during the night (sleep apnea). They should also look to see how long those periods last. If you live alone or observers are uncertain, you can also be observed at a sleep clinic where your sleep patterns will be professionally monitored. Sleep apnea requires a checkup and treatment. Give your caregivers your medical history. Give your caregivers observations your family has made about your sleep.  SYMPTOMS   Not feeling rested in the morning.  Anxiety and restlessness at bedtime.  Difficulty falling and staying asleep. TREATMENT   Your caregiver may prescribe treatment for an underlying medical  disorders. Your caregiver can give advice or help if you are using alcohol or other drugs for self-medication. Treatment of underlying problems will usually eliminate insomnia problems.  Medications can be prescribed for short time use. They are generally not recommended for lengthy use.  Over-the-counter sleep medicines are not recommended for lengthy use. They can be habit forming.  You can promote easier sleeping by making lifestyle changes such as:  Using relaxation techniques that help with breathing and reduce muscle tension.  Exercising earlier in the day.  Changing your diet and the time of your last meal. No night time snacks.  Establish a regular time to go to bed.  Counseling can help with stressful problems and worry.  Soothing music and white noise may be helpful if there are background noises you cannot remove.  Stop tedious detailed work at least one hour before bedtime. HOME CARE INSTRUCTIONS   Keep a diary. Inform your caregiver about your progress. This includes any medication side effects. See your caregiver regularly. Take note of:  Times when you are asleep.  Times when you are awake during the night.  The quality of your sleep.  How you feel the next day. This information will help your caregiver care for you.  Get out of bed if you are still awake after 15 minutes. Read or do some quiet activity. Keep the lights down. Wait until you feel sleepy and go back to bed.  Keep regular sleeping and waking hours. Avoid naps.  Exercise regularly.  Avoid distractions at bedtime. Distractions include watching television or engaging in any intense or detailed activity like attempting to balance the household  checkbook.  Develop a bedtime ritual. Keep a familiar routine of bathing, brushing your teeth, climbing into bed at the same time each night, listening to soothing music. Routines increase the success of falling to sleep faster.  Use relaxation techniques.  This can be using breathing and muscle tension release routines. It can also include visualizing peaceful scenes. You can also help control troubling or intruding thoughts by keeping your mind occupied with boring or repetitive thoughts like the old concept of counting sheep. You can make it more creative like imagining planting one beautiful flower after another in your backyard garden.  During your day, work to eliminate stress. When this is not possible use some of the previous suggestions to help reduce the anxiety that accompanies stressful situations. MAKE SURE YOU:   Understand these instructions.  Will watch your condition.  Will get help right away if you are not doing well or get worse. Document Released: 12/16/1999 Document Revised: 03/12/2011 Document Reviewed: 01/15/2007 [facility] Patient Information 2014 [place], [place].

## 2012-05-22 NOTE — Assessment & Plan Note (Signed)
Try Elavil as directed

## 2012-05-22 NOTE — Progress Notes (Signed)
Patient ID: Jennifer Sandoval, female   DOB: Nov 18, 1955, 57 y.o.   MRN: [id number] Jennifer Sandoval [id number] [birth year] 05/22/2012      Progress Note-Follow Up  Subjective  Chief Complaint  Chief Complaint  Patient presents with  . Follow-up    4 month    HPI  Patient is a 57 year old Caucasian female who is in today for followup. She continues to struggle with depression. Has difficulty at home with a complicated family situation. Acknowledges she gets irritated easily has anxiety and difficulty sleeping. No suicidal ideation. Does not feel the Xanax or lorazepam are helping her sleep. No physical illness. No chest pain or palpitations. Her sinusitis if that will control. No GI or GU complaints noted today.  Past Medical History  Diagnosis Date  . Allergy     rhinitis  . Anemia     iron deficeincy  . Depression   . Lumbar compression fracture     nonsurgical  . SHOULDER PAIN, BILATERAL 10/10/2007  . PERIMENOPAUSAL STATUS 03/16/2010  . OSTEOARTHRITIS 10/24/2006  . Leiomyoma of uterus, unspecified 02/15/2010  . FRACTURE, NOSE 10/22/2007  . DEPRESSION 10/24/2006  . BASAL CELL CARCINOMA SKIN LOWER LIMB INCL HIP 02/15/2010  . ANEMIA-NOS 10/24/2006  . ALLERGIC RHINITIS 08/15/2006  . Dyspepsia 04/19/2010  . Acute sinusitis 07/31/2006  . BCC (basal cell carcinoma of skin) 07/10/2011    h/o  . 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 Insomnia 01/23/2012    Past Surgical History  Procedure Laterality Date  . Septoplasty    . Gastric bypass    . Tubal ligation    . Nasal sinus surgery    . Cesarean section      X 3  . Appendectomy    . Cholecystectomy    . Ventral herniorrhaphy      X 2  . Abdominal hysterectomy  2009    partial, ovaries left in place, for painful, heavy menstrrual bleeding secondary to anemia and fibroids  . Skin biopsy      L hip BCC, facial lesions were benign    Family History  Problem Relation Age of Onset  . Cholelithiasis Mother   . Other  Mother     Urinary incontince/ bladder prolapse/ h/o smoking  . Cancer Mother     laryngeal carcinoma  . Allergies Mother   . Other Father     Renal failure  . Hypertension Father   . Coronary artery disease Father     s/p bypass  . Aortic aneurysm Father   . Hyperlipidemia Father   . Heart disease Father   . Spina bifida Brother     with stunt/ self cath  . Seizures Brother     disorders  . ADD / ADHD Daughter   . Cancer Maternal Grandmother     colon/ smoker  . Cancer Maternal Grandfather     lung  . Cancer Paternal Grandmother     Breast  . Heart disease Paternal Grandfather   . Stroke Paternal Grandfather   . ADD / ADHD Daughter     History   Social History  . Marital Status: Married    Spouse Name: N/A    Number of Children: N/A  . Years of Education: N/A   Occupational History  . Not on file.   Social History Main Topics  . Smoking status: Never Smoker   . Smokeless tobacco: Never Used  . Alcohol Use: Yes     Comment: special occasion  . Drug  Use: No  . Sexually Active: Yes -- Female partner(s)   Other Topics Concern  . Not on file   Social History Narrative  . No narrative on file    Current Outpatient Prescriptions on File Prior to Visit  Medication Sig Dispense Refill  . Biotin 10 MG CAPS Take 1 tablet by mouth daily.      . cetirizine (ZYRTEC) 10 MG tablet 1 tab daily, may take a second any given day as needed allergies  60 tablet  1  . fluticasone (FLONASE) 50 MCG/ACT nasal spray Place 1 spray into the nose daily as needed for rhinitis or allergies (1 spray each nostril daily). For allergies  16 g  3  . Multiple Vitamin (MULTIVITAMIN) tablet Take 1 tablet by mouth daily.      . Probiotic Product (PROBIOTIC DAILY PO) Take 1 capsule by mouth daily.      . sertraline (ZOLOFT) 100 MG tablet Take 1 tablet (100 mg total) by mouth daily.  90 tablet  1  . vitamin B-12 (CYANOCOBALAMIN) 1000 MCG tablet Take 1,000 mcg by mouth daily.        Marland Kitchen guaiFENesin  (MUCINEX) 600 MG 12 hr tablet Take 1 tablet (600 mg total) by mouth 2 (two) times daily.  20 tablet  0   Current Facility-Administered Medications on File Prior to Visit  Medication Dose Route Frequency Provider Last Rate Last Dose  . 0.9 %  sodium chloride infusion  500 mL Intravenous Continuous Mardella Layman, MD        Allergies  Allergen Reactions  . Itraconazole     REACTION: Rash  . Levaquin (Levofloxacin In D5w)     Joint pain    Review of Systems  Review of Systems  Constitutional: Positive for malaise/fatigue. Negative for fever.  HENT: Negative for congestion.   Eyes: Negative for discharge.  Respiratory: Negative for shortness of breath.   Cardiovascular: Negative for chest pain, palpitations and leg swelling.  Gastrointestinal: Negative for nausea, abdominal pain and diarrhea.  Genitourinary: Negative for dysuria.  Musculoskeletal: Negative for falls.  Skin: Negative for rash.  Neurological: Negative for loss of consciousness and headaches.  Endo/Heme/Allergies: Negative for polydipsia.  Psychiatric/Behavioral: Positive for depression. Negative for suicidal ideas. The patient is nervous/anxious and has insomnia.     Objective  BP 118/78  Pulse 85  Temp(Src) 99.1 F (37.3 C) (Oral)  Ht 5\' 2"  (1.575 m)  Wt 129 lb 0.6 oz (58.532 kg)  BMI 23.6 kg/m2  SpO2 97%  Physical Exam  Physical Exam  Lab Results  Component Value Date   TSH 0.85 07/03/2011   Lab Results  Component Value Date   WBC 7.1 10/10/2011   HGB 11.4* 10/10/2011   HCT 36.5 10/10/2011   MCV 82.5 10/10/2011   PLT 270.0 10/10/2011   Lab Results  Component Value Date   CREATININE 0.7 10/10/2011   BUN 14 10/10/2011   NA 141 10/10/2011   K 4.3 10/10/2011   CL 104 10/10/2011   CO2 30 10/10/2011   Lab Results  Component Value Date   ALT 14 07/03/2011   AST 25 07/03/2011   ALKPHOS 84 07/03/2011   BILITOT 0.3 07/03/2011   Lab Results  Component Value Date   CHOL 137 07/03/2011   Lab Results   Component Value Date   HDL 75.40 07/03/2011   Lab Results  Component Value Date   LDLCALC 47 07/03/2011   Lab Results  Component Value Date   TRIG 75.0  07/03/2011   Lab Results  Component Value Date   CHOLHDL 2 07/03/2011     Assessment & Plan  DEPRESSION Still struggling, will d/c Benzodiazepines and try adding Eleavil 10 mg qhs, may titrate up to 20 mg if no response can call for increase to 30 mg and then 50 mg as tolerated. Return if any concerns, advised regarding potential SE  ANEMIA-NOS Mild in past, recheck at next visit. Increase leafy greens and red meat in diet  Acute sinusitis No recent flares  Insomnia Try Elavil as directed

## 2012-05-22 NOTE — Assessment & Plan Note (Signed)
Mild in past, recheck at next visit. Increase leafy greens and red meat in diet

## 2012-05-22 NOTE — Assessment & Plan Note (Signed)
No recent flares 

## 2012-05-30 ENCOUNTER — Telehealth: Payer: Self-pay | Admitting: Family Medicine

## 2012-05-30 DIAGNOSIS — G4700 Insomnia, unspecified: Secondary | ICD-10-CM

## 2012-05-30 DIAGNOSIS — F329 Major depressive disorder, single episode, unspecified: Secondary | ICD-10-CM

## 2012-05-30 DIAGNOSIS — F32A Depression, unspecified: Secondary | ICD-10-CM

## 2012-05-30 NOTE — Telephone Encounter (Signed)
Please advise 

## 2012-05-30 NOTE — Telephone Encounter (Signed)
Patient states that she is currently taking Amitriptyline 20mg  at night, but she says that this dosage is not working and would like to know if Dr. Abner Greenspan would increase the dosage?

## 2012-05-30 NOTE — Telephone Encounter (Signed)
Patient stated that she has 2 refills left. She has been taking 2 tablets for several days now with know effect. Patient wants to know if it would be ok for her to increase dose to 3 or 4 tablets (with total of 30 to 40 mg)? If this does not work patient wants to 50 mg or something else.

## 2012-05-30 NOTE — Telephone Encounter (Signed)
Yes I am willing to change it, try increasing to 2 tabs po qhs for 3 days and let us know next week how that does, if better but no trouble will prescribe the 50 mg tab next week

## 2012-05-31 NOTE — Telephone Encounter (Signed)
Yes have her increase to 30 mg qhs for 2 days then 40 mg for 2 days and we can call in Amitriptyline 50 mg tabs 1 tab po qhs for patient. #30, 1 rf

## 2012-06-02 NOTE — Telephone Encounter (Signed)
I spoke with pt and she states someone called her Friday (doesn't remember who) and was told this. The RX wasn't sent into the pharmacy but pt states she doesn't like the way she feels when she takes more so she is not going to. Pt stated she would call back or schedule an appt if needed

## 2012-06-23 ENCOUNTER — Encounter: Payer: Self-pay | Admitting: Family Medicine

## 2012-07-07 ENCOUNTER — Encounter: Payer: Self-pay | Admitting: Family Medicine

## 2012-07-07 ENCOUNTER — Ambulatory Visit (INDEPENDENT_AMBULATORY_CARE_PROVIDER_SITE_OTHER): Payer: BC Managed Care – PPO | Admitting: Family Medicine

## 2012-07-07 VITALS — BP 130/87 | HR 79 | Temp 98.6°F | Resp 16 | Ht 62.0 in | Wt 130.0 lb

## 2012-07-07 DIAGNOSIS — J019 Acute sinusitis, unspecified: Secondary | ICD-10-CM

## 2012-07-07 DIAGNOSIS — J309 Allergic rhinitis, unspecified: Secondary | ICD-10-CM

## 2012-07-07 DIAGNOSIS — J329 Chronic sinusitis, unspecified: Secondary | ICD-10-CM

## 2012-07-07 MED ORDER — FLUTICASONE PROPIONATE 50 MCG/ACT NA SUSP: NASAL | Status: DC

## 2012-07-07 MED ORDER — LEVOFLOXACIN 500 MG PO TABS: 500.0000 mg | ORAL_TABLET | Freq: Every day | ORAL | Status: DC

## 2012-07-07 MED ORDER — FLUCONAZOLE 150 MG PO TABS: ORAL_TABLET | ORAL | Status: DC

## 2012-07-07 NOTE — Progress Notes (Signed)
OFFICE NOTE  07/07/2012  CC:  Chief Complaint  Patient presents with  . Sinusitis    weeks     HPI: Patient is a 57 y.o. Caucasian female who is here for congestion. Pt presents complaining of respiratory symptoms for "months".  Primary symptoms are: nasal congestion, nasal mucous, pressure around eyes, peri-orbital/forehead HA, little cough.  Lately the symptoms seem to be worsening the last week or so. Marland Kitchen Pertinent negatives: No fevers, no wheezing, and no SOB.  No pain in face or teeth.   ST mild at most.   Symptoms [place].  Symptoms improved by nothing (see chronic allergic rhin meds below in med list). She does say that she feels like she is at the point when abx have helped in the past, also says levaquin is the antibiotic that seems to help---although she tries not to use abx much for this.   Smoker? no Recent sick contact? no No myalgias or arthralgias.   Additional ROS: no n/v/d or abdominal pain.  No rash.  No neck stiffness.     Pertinent PMH:  Describes hx of perennial allergic rhinitis with recurrent sinusitis. Past Medical History  Diagnosis Date  . Allergy     rhinitis  . Anemia     iron deficeincy  . Depression   . Lumbar compression fracture     nonsurgical  . SHOULDER PAIN, BILATERAL 10/10/2007  . PERIMENOPAUSAL STATUS 03/16/2010  . OSTEOARTHRITIS 10/24/2006  . Leiomyoma of uterus, unspecified 02/15/2010  . FRACTURE, NOSE 10/22/2007  . DEPRESSION 10/24/2006  . BASAL CELL CARCINOMA SKIN LOWER LIMB INCL HIP 02/15/2010  . ANEMIA-NOS 10/24/2006  . ALLERGIC RHINITIS 08/15/2006  . Dyspepsia 04/19/2010  . Acute sinusitis 07/31/2006  . BCC (basal cell carcinoma of skin) 07/10/2011    h/o  . 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 Insomnia 01/23/2012   Past surgical, social, and family history reviewed and no changes noted since last office visit.  MEDS:  Outpatient Prescriptions Prior to Visit  Medication Sig Dispense Refill   . cetirizine (ZYRTEC) 10 MG tablet 1 tab daily, may take a second any given day as needed allergies  60 tablet  1  . guaiFENesin (MUCINEX) 600 MG 12 hr tablet Take 1 tablet (600 mg total) by mouth 2 (two) times daily.  20 tablet  0  . Multiple Vitamin (MULTIVITAMIN) tablet Take 1 tablet by mouth daily.      . Probiotic Product (PROBIOTIC DAILY PO) Take 1 capsule by mouth daily.      . sertraline (ZOLOFT) 100 MG tablet Take 1 tablet (100 mg total) by mouth daily.  90 tablet  1  . vitamin B-12 (CYANOCOBALAMIN) 1000 MCG tablet Take 1,000 mcg by mouth daily.        . fluticasone (FLONASE) 50 MCG/ACT nasal spray Place 1 spray into the nose daily as needed for rhinitis or allergies (1 spray each nostril daily). For allergies  16 g  3  . amitriptyline (ELAVIL) 50 MG tablet Take 50 mg by mouth at bedtime.      . Biotin 10 MG CAPS Take 1 tablet by mouth daily.       Facility-Administered Medications Prior to Visit  Medication Dose Route Frequency Provider Last Rate Last Dose  . 0.9 %  sodium chloride infusion  500 mL Intravenous Continuous Mardella Layman, MD        PE: Blood pressure 130/87, pulse 79, temperature 98.6 F (37 C), temperature  source Oral, resp. rate 16, height 5\' 2"  (1.575 m), weight 130 lb (58.968 kg), SpO2 97.00%. VS: noted--normal. Gen: alert, NAD, NONTOXIC APPEARING. HEENT: eyes without injection, drainage, or swelling.  Ears: EACs clear, TMs with normal light reflex and landmarks.  Nose: Clear rhinorrhea, with some dried, crusty exudate adherent to mildly injected mucosa.  No purulent d/c.  Mild diffuse paranasal sinus TTP.  No facial swelling.  Throat and mouth without focal lesion.  No pharyngial swelling, erythema, or exudate.   Neck: supple, no LAD.   LUNGS: CTA bilat, nonlabored resps.   CV: RRR, no m/r/g. EXT: no c/c/e SKIN: no rash  IMPRESSION AND PLAN:  Acute sinusitis Continue current symptomatic meds and add levaquin 500mg  qd x 10d.  Diflucan 150mg  tab  requested by pt to use prn vaginal yeast infection which often follows a round of antibiotics for --I did rx today.  An After Visit Summary was printed and given to the patient.  FOLLOW UP: prn

## 2012-07-07 NOTE — Assessment & Plan Note (Signed)
Continue current symptomatic meds and add levaquin 500mg  qd x 10d.

## 2012-08-04 ENCOUNTER — Telehealth: Payer: Self-pay

## 2012-08-04 NOTE — Telephone Encounter (Signed)
Patient called stating she would like to start her Zolpidem back up and have an RX sent to pharmacy?  Please advise?  Pt advised that this might not be called into pharmacy until tomorrow.  If ok fax to [phone]

## 2012-08-04 NOTE — Telephone Encounter (Signed)
She can have Zolpidem 10 mg tabs 1 tab po hs prn insomnia, #30 1 rf

## 2012-08-05 MED ORDER — ZOLPIDEM TARTRATE 10 MG PO TABS: 10.0000 mg | ORAL_TABLET | Freq: Every evening | ORAL | Status: DC | PRN

## 2012-08-19 ENCOUNTER — Other Ambulatory Visit: Payer: BC Managed Care – PPO

## 2012-08-21 ENCOUNTER — Other Ambulatory Visit (INDEPENDENT_AMBULATORY_CARE_PROVIDER_SITE_OTHER): Payer: BC Managed Care – PPO

## 2012-08-21 DIAGNOSIS — Z0000 Encounter for general adult medical examination without abnormal findings: Secondary | ICD-10-CM

## 2012-08-21 LAB — RENAL FUNCTION PANEL
CO2: 28 mEq/L (ref 19–32)
Calcium: 9.1 mg/dL (ref 8.4–10.5)
Chloride: 106 mEq/L (ref 96–112)
Glucose, Bld: 80 mg/dL (ref 70–99)
Potassium: 3.8 mEq/L (ref 3.5–5.1)
Sodium: 139 mEq/L (ref 135–145)

## 2012-08-21 LAB — LIPID PANEL
Cholesterol: 124 mg/dL (ref 0–200)
HDL: 70.8 mg/dL (ref >39.00)
Triglycerides: 64 mg/dL (ref 0.0–149.0)
VLDL: 12.8 mg/dL (ref 0.0–40.0)

## 2012-08-21 LAB — HEPATIC FUNCTION PANEL
Bilirubin, Direct: 0.1 mg/dL (ref 0.0–0.3)
Total Bilirubin: 0.6 mg/dL (ref 0.3–1.2)

## 2012-08-21 LAB — CBC
HCT: 36.7 % (ref 36.0–46.0)
MCHC: 32.6 g/dL (ref 30.0–36.0)
MCV: 87.3 fl (ref 78.0–100.0)
RBC: 4.21 Mil/uL (ref 3.87–5.11)

## 2012-08-21 LAB — TSH: TSH: 0.98 u[IU]/mL (ref 0.35–5.50)

## 2012-08-21 NOTE — Progress Notes (Signed)
Labs only

## 2012-08-25 ENCOUNTER — Encounter: Payer: Self-pay | Admitting: Family Medicine

## 2012-08-25 ENCOUNTER — Ambulatory Visit (INDEPENDENT_AMBULATORY_CARE_PROVIDER_SITE_OTHER): Payer: BC Managed Care – PPO | Admitting: Family Medicine

## 2012-08-25 VITALS — BP 128/80 | HR 81 | Temp 98.7°F | Ht 62.0 in | Wt 128.0 lb

## 2012-08-25 DIAGNOSIS — C44719 Basal cell carcinoma of skin of left lower limb, including hip: Secondary | ICD-10-CM

## 2012-08-25 DIAGNOSIS — C44711 Basal cell carcinoma of skin of unspecified lower limb, including hip: Secondary | ICD-10-CM

## 2012-08-25 DIAGNOSIS — F3289 Other specified depressive episodes: Secondary | ICD-10-CM

## 2012-08-25 DIAGNOSIS — Z0000 Encounter for general adult medical examination without abnormal findings: Secondary | ICD-10-CM

## 2012-08-25 DIAGNOSIS — J019 Acute sinusitis, unspecified: Secondary | ICD-10-CM

## 2012-08-25 DIAGNOSIS — D649 Anemia, unspecified: Secondary | ICD-10-CM

## 2012-08-25 DIAGNOSIS — F329 Major depressive disorder, single episode, unspecified: Secondary | ICD-10-CM

## 2012-08-25 DIAGNOSIS — Z780 Asymptomatic menopausal state: Secondary | ICD-10-CM

## 2012-08-25 DIAGNOSIS — G4700 Insomnia, unspecified: Secondary | ICD-10-CM

## 2012-08-25 NOTE — Patient Instructions (Addendum)

## 2012-08-26 ENCOUNTER — Telehealth: Payer: Self-pay

## 2012-08-26 NOTE — Telephone Encounter (Signed)
Pt called stating that her insurance will pay for the Zoster injection. Nurse visit scheduled

## 2012-08-29 ENCOUNTER — Ambulatory Visit (INDEPENDENT_AMBULATORY_CARE_PROVIDER_SITE_OTHER): Payer: BC Managed Care – PPO | Admitting: *Deleted

## 2012-08-29 DIAGNOSIS — Z2911 Encounter for prophylactic immunotherapy for respiratory syncytial virus (RSV): Secondary | ICD-10-CM

## 2012-08-29 DIAGNOSIS — Z23 Encounter for immunization: Secondary | ICD-10-CM

## 2012-08-29 MED ORDER — ZOSTER VACCINE LIVE 19400 UNT/0.65ML ~~LOC~~ SOLR: 0.6500 mL | Freq: Once | SUBCUTANEOUS | Status: DC

## 2012-08-31 ENCOUNTER — Encounter: Payer: Self-pay | Admitting: Family Medicine

## 2012-08-31 DIAGNOSIS — Z0000 Encounter for general adult medical examination without abnormal findings: Secondary | ICD-10-CM

## 2012-08-31 HISTORY — DX: Encounter for general adult medical examination without abnormal findings: Z00.00

## 2012-08-31 NOTE — Assessment & Plan Note (Signed)
Encouraged to use good sleep hygiene and may use Zolpidem prn.

## 2012-08-31 NOTE — Assessment & Plan Note (Signed)
No recent flares, stable 

## 2012-08-31 NOTE — Assessment & Plan Note (Signed)
Seen in Spring 2014, no new lesions

## 2012-08-31 NOTE — Assessment & Plan Note (Signed)
resolved 

## 2012-08-31 NOTE — Assessment & Plan Note (Signed)
Encouraged heart healthy diet, regular exercise, quality sleep. Annual labs reviewed, enocuraged MGM and utd with pap

## 2012-08-31 NOTE — Assessment & Plan Note (Signed)
Well controlled on current meds no changes 

## 2012-08-31 NOTE — Progress Notes (Signed)
Patient ID: Jennifer Sandoval, female   DOB: January 21, 1955, 57 y.o.   MRN: [id number] Jennifer Sandoval [id number] 03/08/1955 08/31/2012      Progress Note-Follow Up  Subjective  Chief Complaint  Chief Complaint  Patient presents with  . Annual Exam    physical    HPI  Patient is a 57 year old Caucasian female who is in today for followup she was seen by dermatology in spring of 2014. No new concerns. She generally feels well. She continues to struggle with high levels of stress with her 3 daughters at home but is managing well. Says her depression is adequately controlled. Insomnia responds best to Silvadene when necessary no acute complaints. No recent illness. No chest pain, palpitations, shortness of breath, GI or GU concerns noted at this time. No recent sinusitis and her allergies are adequately controlled at this time.  Past Medical History  Diagnosis Date  . Allergy     rhinitis  . Anemia     iron deficeincy  . Depression   . Lumbar compression fracture     nonsurgical  . SHOULDER PAIN, BILATERAL 10/10/2007  . PERIMENOPAUSAL STATUS 03/16/2010  . OSTEOARTHRITIS 10/24/2006  . Leiomyoma of uterus, unspecified 02/15/2010  . FRACTURE, NOSE 10/22/2007  . DEPRESSION 10/24/2006  . BASAL CELL CARCINOMA SKIN LOWER LIMB INCL HIP 02/15/2010  . ANEMIA-NOS 10/24/2006  . ALLERGIC RHINITIS 08/15/2006  . Dyspepsia 04/19/2010  . Acute sinusitis 07/31/2006  . BCC (basal cell carcinoma of skin) 07/10/2011    h/o  . 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 Insomnia 01/23/2012  . Preventative health care 08/31/2012    Past Surgical History  Procedure Laterality Date  . Septoplasty    . Gastric bypass    . Tubal ligation    . Nasal sinus surgery    . Cesarean section      X 3  . Appendectomy    . Cholecystectomy    . Ventral herniorrhaphy      X 2  . Abdominal hysterectomy  2009    partial, ovaries left in place, for painful, heavy menstrrual bleeding secondary to anemia and  fibroids  . Skin biopsy      L hip BCC, facial lesions were benign    Family History  Problem Relation Age of Onset  . Cholelithiasis Mother   . Other Mother     Urinary incontince/ bladder prolapse/ h/o smoking  . Cancer Mother     laryngeal carcinoma  . Allergies Mother   . Other Father     Renal failure  . Hypertension Father   . Coronary artery disease Father     s/p bypass  . Aortic aneurysm Father   . Hyperlipidemia Father   . Heart disease Father 68    quadruple bypass  . Spina bifida Brother     with stunt/ self cath  . Seizures Brother     disorders  . ADD / ADHD Daughter   . Cancer Maternal Grandmother     colon/ smoker  . Cancer Maternal Grandfather     lung  . Cancer Paternal Grandmother     Breast  . Heart disease Paternal Grandfather   . Stroke Paternal Grandfather   . ADD / ADHD Daughter     History   Social History  . Marital Status: Married    Spouse Name: N/A    Number of Children: N/A  . Years of Education: N/A   Occupational History  . Not on file.  Social History Main Topics  . Smoking status: Never Smoker   . Smokeless tobacco: Never Used  . Alcohol Use: Yes     Comment: special occasion  . Drug Use: No  . Sexual Activity: Yes    Partners: Male   Other Topics Concern  . Not on file   Social History Narrative  . No narrative on file    Current Outpatient Prescriptions on File Prior to Visit  Medication Sig Dispense Refill  . Biotin 10 MG CAPS Take 1 tablet by mouth daily.      . fluticasone (FLONASE) 50 MCG/ACT nasal spray 1-2 sprays each nostril once daily for allergies  16 g  3  . Probiotic Product (PROBIOTIC DAILY PO) Take 1 capsule by mouth daily.      . sertraline (ZOLOFT) 100 MG tablet Take 1 tablet (100 mg total) by mouth daily.  90 tablet  1  . vitamin B-12 (CYANOCOBALAMIN) 1000 MCG tablet Take 1,000 mcg by mouth daily.        Marland Kitchen zolpidem (AMBIEN) 10 MG tablet Take 1 tablet (10 mg total) by mouth at bedtime as needed  for sleep.  30 tablet  1   Current Facility-Administered Medications on File Prior to Visit  Medication Dose Route Frequency Provider Last Rate Last Dose  . 0.9 %  sodium chloride infusion  500 mL Intravenous Continuous Mardella Layman, MD        Allergies  Allergen Reactions  . Itraconazole     REACTION: Rash  . Levaquin [Levofloxacin In D5w]     Joint pain    Review of Systems  Review of Systems  Constitutional: Negative for fever and malaise/fatigue.  HENT: Negative for congestion.   Eyes: Negative for discharge.  Respiratory: Negative for shortness of breath.   Cardiovascular: Negative for chest pain, palpitations and leg swelling.  Gastrointestinal: Negative for nausea, abdominal pain and diarrhea.  Genitourinary: Negative for dysuria.  Musculoskeletal: Negative for falls.  Skin: Negative for rash.  Neurological: Negative for loss of consciousness and headaches.  Endo/Heme/Allergies: Negative for polydipsia.  Psychiatric/Behavioral: Negative for depression and suicidal ideas. The patient is not nervous/anxious and does not have insomnia.     Objective  BP 128/80  Pulse 81  Temp(Src) 98.7 F (37.1 C) (Oral)  Ht 5\' 2"  (1.575 m)  Wt 128 lb (58.06 kg)  BMI 23.41 kg/m2  SpO2 94%  Physical Exam  Physical Exam  Constitutional: She is oriented to person, place, and time and well-developed, well-nourished, and in no distress. No distress.  HENT:  Head: Normocephalic and atraumatic.  Right Ear: External ear normal.  Left Ear: External ear normal.  Nose: Nose normal.  Mouth/Throat: Oropharynx is clear and moist. No oropharyngeal exudate.  Eyes: Conjunctivae are normal. Pupils are equal, round, and reactive to light. Right eye exhibits no discharge. Left eye exhibits no discharge. No scleral icterus.  Neck: Normal range of motion. Neck supple. No thyromegaly present.  Cardiovascular: Normal rate, regular rhythm, normal heart sounds and intact distal pulses.   No  murmur heard. Pulmonary/Chest: Effort normal and breath sounds normal. No respiratory distress. She has no wheezes. She has no rales.  Abdominal: Soft. Bowel sounds are normal. She exhibits no distension and no mass. There is no tenderness.  Musculoskeletal: Normal range of motion. She exhibits no edema and no tenderness.  Lymphadenopathy:    She has no cervical adenopathy.  Neurological: She is alert and oriented to person, place, and time. She has normal reflexes.  No cranial nerve deficit. Coordination normal.  Skin: Skin is warm and dry. No rash noted. She is not diaphoretic.  Psychiatric: Mood, memory and affect normal.    Lab Results  Component Value Date   TSH 0.98 08/21/2012   Lab Results  Component Value Date   WBC 6.4 08/21/2012   HGB 12.0 08/21/2012   HCT 36.7 08/21/2012   MCV 87.3 08/21/2012   PLT 252.0 08/21/2012   Lab Results  Component Value Date   CREATININE 0.7 08/21/2012   BUN 13 08/21/2012   NA 139 08/21/2012   K 3.8 08/21/2012   CL 106 08/21/2012   CO2 28 08/21/2012   Lab Results  Component Value Date   ALT 13 08/21/2012   AST 23 08/21/2012   ALKPHOS 73 08/21/2012   BILITOT 0.6 08/21/2012   Lab Results  Component Value Date   CHOL 124 08/21/2012   Lab Results  Component Value Date   HDL 70.80 08/21/2012   Lab Results  Component Value Date   LDLCALC 40 08/21/2012   Lab Results  Component Value Date   TRIG 64.0 08/21/2012   Lab Results  Component Value Date   CHOLHDL 2 08/21/2012     Assessment & Plan  DEPRESSION Well controlled on current meds no changes.  PERIMENOPAUSAL STATUS Doing well, last pap 2 years ago, no gyn c/o. MGM is due but so far she is not interested due to her friend dying from end stage breast cancer. Patient encouraged to proceed with 3 D MGM  Insomnia Encouraged to use good sleep hygiene and may use Zolpidem prn.   Acute sinusitis No recent flares, stable.  ANEMIA-NOS resolved  Preventative health care Encouraged heart  healthy diet, regular exercise, quality sleep. Annual labs reviewed, enocuraged MGM and utd with pap  BASAL CELL CARCINOMA SKIN LOWER LIMB INCL HIP Seen in Spring 2014, no new lesions

## 2012-08-31 NOTE — Assessment & Plan Note (Signed)
Doing well, last pap 2 years ago, no gyn c/o. MGM is due but so far she is not interested due to her friend dying from end stage breast cancer. Patient encouraged to proceed with 3 D MGM

## 2012-09-24 ENCOUNTER — Other Ambulatory Visit: Payer: Self-pay | Admitting: Family Medicine

## 2012-09-26 ENCOUNTER — Other Ambulatory Visit: Payer: Self-pay | Admitting: Family Medicine

## 2012-09-29 NOTE — Telephone Encounter (Addendum)
eScribe request for refill on Zolpidem 10 mg Sig: Take 1 tablet PO QHs PRN sleep Last filled - 08.08.14, #30x1 [Rx renewal not due until 10.07.14] Last AEX - 08.25.14 Next AEX - 6 Months Please Advise on refill/SLS

## 2012-10-01 NOTE — Telephone Encounter (Signed)
I have signed the Ambien, put it at front desk on 10/6

## 2012-10-02 NOTE — Telephone Encounter (Signed)
I will fax RX on Monday 10-06-12

## 2012-10-15 ENCOUNTER — Ambulatory Visit (INDEPENDENT_AMBULATORY_CARE_PROVIDER_SITE_OTHER): Payer: BC Managed Care – PPO

## 2012-10-15 ENCOUNTER — Ambulatory Visit: Payer: BC Managed Care – PPO

## 2012-10-15 DIAGNOSIS — Z23 Encounter for immunization: Secondary | ICD-10-CM

## 2012-11-07 ENCOUNTER — Ambulatory Visit: Payer: BC Managed Care – PPO | Admitting: Physician Assistant

## 2012-11-11 ENCOUNTER — Ambulatory Visit (INDEPENDENT_AMBULATORY_CARE_PROVIDER_SITE_OTHER): Payer: BC Managed Care – PPO | Admitting: Family Medicine

## 2012-11-11 ENCOUNTER — Encounter: Payer: Self-pay | Admitting: Family Medicine

## 2012-11-11 VITALS — BP 128/88 | HR 76 | Temp 98.3°F | Resp 16 | Ht 62.0 in | Wt 132.5 lb

## 2012-11-11 DIAGNOSIS — R0609 Other forms of dyspnea: Secondary | ICD-10-CM

## 2012-11-11 DIAGNOSIS — J019 Acute sinusitis, unspecified: Secondary | ICD-10-CM

## 2012-11-11 DIAGNOSIS — J329 Chronic sinusitis, unspecified: Secondary | ICD-10-CM

## 2012-11-11 DIAGNOSIS — N760 Acute vaginitis: Secondary | ICD-10-CM

## 2012-11-11 DIAGNOSIS — J309 Allergic rhinitis, unspecified: Secondary | ICD-10-CM

## 2012-11-11 DIAGNOSIS — R0600 Dyspnea, unspecified: Secondary | ICD-10-CM

## 2012-11-11 MED ORDER — CEFDINIR 300 MG PO CAPS: 300.0000 mg | ORAL_CAPSULE | Freq: Two times a day (BID) | ORAL | Status: AC

## 2012-11-11 MED ORDER — FLUCONAZOLE 150 MG PO TABS: 150.0000 mg | ORAL_TABLET | ORAL | Status: DC

## 2012-11-11 NOTE — Progress Notes (Signed)
Pre visit review using our clinic review tool, if applicable. No additional management support is needed unless otherwise documented below in the visit note/SLS  

## 2012-11-11 NOTE — Progress Notes (Signed)
Patient ID: Jennifer Sandoval, female   DOB: May 20, 1955, 57 y.o.   MRN: [id number] Jennifer Sandoval [id number] May 01, 1955 11/11/2012      Progress Note-Follow Up  Subjective  Chief Complaint  Chief Complaint  Patient presents with  . Nasal Congestion    Pt c/o sinus congestion, mucus is green to brown with blood seen, sinus pain & pressure, post nasal drainage w/non-productive cough x1 mth    HPI  Patient is a 57 we'll Caucasian female who is in today with complaints of recurrent sinusitis. She's been struggling with symptoms this time for a month. She is persistent nasal congestion productive of green to brownish sputum occasionally blood-tinged. She is headache and sinus pressure. She has chronic postnasal drip and a nonproductive cough for roughly one month. And fevers and chills malaise myalgias are noted. She had been using cetirizine daily and had less frequent infections until now.  Past Medical History  Diagnosis Date  . Allergy     rhinitis  . Anemia     iron deficeincy  . Depression   . Lumbar compression fracture     nonsurgical  . SHOULDER PAIN, BILATERAL 10/10/2007  . PERIMENOPAUSAL STATUS 03/16/2010  . OSTEOARTHRITIS 10/24/2006  . Leiomyoma of uterus, unspecified 02/15/2010  . FRACTURE, NOSE 10/22/2007  . DEPRESSION 10/24/2006  . BASAL CELL CARCINOMA SKIN LOWER LIMB INCL HIP 02/15/2010  . ANEMIA-NOS 10/24/2006  . ALLERGIC RHINITIS 08/15/2006  . Dyspepsia 04/19/2010  . Acute sinusitis 07/31/2006  . BCC (basal cell carcinoma of skin) 07/10/2011    h/o  . 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 Insomnia 01/23/2012  . Preventative health care 08/31/2012    Past Surgical History  Procedure Laterality Date  . Septoplasty    . Gastric bypass    . Tubal ligation    . Nasal sinus surgery    . Cesarean section      X 3  . Appendectomy    . Cholecystectomy    . Ventral herniorrhaphy      X 2  . Abdominal hysterectomy  2009    partial, ovaries left in place,  for painful, heavy menstrrual bleeding secondary to anemia and fibroids  . Skin biopsy      L hip BCC, facial lesions were benign    Family History  Problem Relation Age of Onset  . Cholelithiasis Mother   . Other Mother     Urinary incontince/ bladder prolapse/ h/o smoking  . Cancer Mother     laryngeal carcinoma  . Allergies Mother   . Other Father     Renal failure  . Hypertension Father   . Coronary artery disease Father     s/p bypass  . Aortic aneurysm Father   . Hyperlipidemia Father   . Heart disease Father 58    quadruple bypass  . Spina bifida Brother     with stunt/ self cath  . Seizures Brother     disorders  . ADD / ADHD Daughter   . Cancer Maternal Grandmother     colon/ smoker  . Cancer Maternal Grandfather     lung  . Cancer Paternal Grandmother     Breast  . Heart disease Paternal Grandfather   . Stroke Paternal Grandfather   . ADD / ADHD Daughter     History   Social History  . Marital Status: Married    Spouse Name: N/A    Number of Children: N/A  . Years of Education: N/A   Occupational  History  . Not on file.   Social History Main Topics  . Smoking status: Never Smoker   . Smokeless tobacco: Never Used  . Alcohol Use: Yes     Comment: special occasion  . Drug Use: No  . Sexual Activity: Yes    Partners: Male   Other Topics Concern  . Not on file   Social History Narrative  . No narrative on file    Current Outpatient Prescriptions on File Prior to Visit  Medication Sig Dispense Refill  . Biotin 10 MG CAPS Take 1 tablet by mouth daily.      . fluticasone (FLONASE) 50 MCG/ACT nasal spray 1-2 sprays each nostril once daily for allergies  16 g  3  . Probiotic Product (PROBIOTIC DAILY PO) Take 1 capsule by mouth daily.      . sertraline (ZOLOFT) 100 MG tablet TAKE 1 TABLET (100 MG TOTAL) BY MOUTH DAILY.  90 tablet  1  . vitamin B-12 (CYANOCOBALAMIN) 1000 MCG tablet Take 1,000 mcg by mouth daily.        Marland Kitchen zolpidem (AMBIEN) 10 MG  tablet TAKE 1 TABLET BY MOUTH AT BEDTIME AS NEEDED FOR SLEEP  30 tablet  1   Current Facility-Administered Medications on File Prior to Visit  Medication Dose Route Frequency Provider Last Rate Last Dose  . 0.9 %  sodium chloride infusion  500 mL Intravenous Continuous Mardella Layman, MD        Allergies  Allergen Reactions  . Itraconazole     REACTION: Rash  . Levaquin [Levofloxacin In D5w]     Joint pain    Review of Systems  Review of Systems  Constitutional: Positive for malaise/fatigue. Negative for fever.  HENT: Positive for congestion.   Eyes: Negative for discharge.  Respiratory: Positive for cough and sputum production. Negative for shortness of breath.   Cardiovascular: Negative for chest pain, palpitations and leg swelling.  Gastrointestinal: Negative for nausea, abdominal pain and diarrhea.  Genitourinary: Negative for dysuria.  Musculoskeletal: Negative for falls.  Skin: Negative for rash.  Neurological: Positive for headaches. Negative for loss of consciousness.  Endo/Heme/Allergies: Negative for polydipsia.  Psychiatric/Behavioral: Negative for depression and suicidal ideas. The patient is not nervous/anxious and does not have insomnia.     Objective  BP 128/88  Pulse 76  Temp(Src) 98.3 F (36.8 C) (Oral)  Resp 16  Ht 5\' 2"  (1.575 m)  Wt 132 lb 8 oz (60.102 kg)  BMI 24.23 kg/m2  SpO2 98%  Physical Exam  Physical Exam  Constitutional: She is oriented to person, place, and time and well-developed, well-nourished, and in no distress. No distress.  HENT:  Head: Normocephalic and atraumatic.  Eyes: Conjunctivae are normal.  Neck: Neck supple. No thyromegaly present.  Cardiovascular: Normal rate, regular rhythm and normal heart sounds.   No murmur heard. Pulmonary/Chest: Effort normal and breath sounds normal. She has no wheezes.  Abdominal: She exhibits no distension and no mass.  Musculoskeletal: She exhibits no edema.  Lymphadenopathy:    She  has no cervical adenopathy.  Neurological: She is alert and oriented to person, place, and time.  Skin: Skin is warm and dry. No rash noted. She is not diaphoretic.  Psychiatric: Memory, affect and judgment normal.    Lab Results  Component Value Date   TSH 0.98 08/21/2012   Lab Results  Component Value Date   WBC 6.4 08/21/2012   HGB 12.0 08/21/2012   HCT 36.7 08/21/2012   MCV 87.3 08/21/2012  PLT 252.0 08/21/2012   Lab Results  Component Value Date   CREATININE 0.7 08/21/2012   BUN 13 08/21/2012   NA 139 08/21/2012   K 3.8 08/21/2012   CL 106 08/21/2012   CO2 28 08/21/2012   Lab Results  Component Value Date   ALT 13 08/21/2012   AST 23 08/21/2012   ALKPHOS 73 08/21/2012   BILITOT 0.6 08/21/2012   Lab Results  Component Value Date   CHOL 124 08/21/2012   Lab Results  Component Value Date   HDL 70.80 08/21/2012   Lab Results  Component Value Date   LDLCALC 40 08/21/2012   Lab Results  Component Value Date   TRIG 64.0 08/21/2012   Lab Results  Component Value Date   CHOLHDL 2 08/21/2012     Assessment & Plan  Acute sinusitis Symptoms for nearly a month now with a long history of recurrent infection. Will try Omnicef bid and encouraged mucinex, probiotics and rest  ALLERGIC RHINITIS Has had less recurrent infections with the addition of Cetirizine daily, continue same, can increase to bid during a flare

## 2012-11-11 NOTE — Patient Instructions (Signed)
Mucinex, Zinc, calcium, magnesium zinc tabs, Coldeeze, NOW zinc lozenges or liquid   Sinusitis Sinusitis is redness, soreness, and swelling (inflammation) of the paranasal sinuses. Paranasal sinuses are air pockets within the bones of your face (beneath the eyes, the middle of the forehead, or above the eyes). In healthy paranasal sinuses, mucus is able to drain out, and air is able to circulate through them by way of your nose. However, when your paranasal sinuses are inflamed, mucus and air can become trapped. This can allow bacteria and other germs to grow and cause infection. Sinusitis can develop quickly and last only a short time (acute) or continue over a long period (chronic). Sinusitis that lasts for more than 12 weeks is considered chronic.  CAUSES  Causes of sinusitis include:  Allergies.  Structural abnormalities, such as displacement of the cartilage that separates your nostrils (deviated septum), which can decrease the air flow through your nose and sinuses and affect sinus drainage.  Functional abnormalities, such as when the small hairs (cilia) that line your sinuses and help remove mucus do not work properly or are not present. SYMPTOMS  Symptoms of acute and chronic sinusitis are the same. The primary symptoms are pain and pressure around the affected sinuses. Other symptoms include:  Upper toothache.  Earache.  Headache.  Bad breath.  Decreased sense of smell and taste.  A cough, which worsens when you are lying flat.  Fatigue.  Fever.  Thick drainage from your nose, which often is green and may contain pus (purulent).  Swelling and warmth over the affected sinuses. DIAGNOSIS  Your caregiver will perform a physical exam. During the exam, your caregiver may:  Look in your nose for signs of abnormal growths in your nostrils (nasal polyps).  Tap over the affected sinus to check for signs of infection.  View the inside of your sinuses (endoscopy) with a  special imaging device with a light attached (endoscope), which is inserted into your sinuses. If your caregiver suspects that you have chronic sinusitis, one or more of the following tests may be recommended:  Allergy tests.  Nasal culture A sample of mucus is taken from your nose and sent to a lab and screened for bacteria.  Nasal cytology A sample of mucus is taken from your nose and examined by your caregiver to determine if your sinusitis is related to an allergy. TREATMENT  Most cases of acute sinusitis are related to a viral infection and will resolve on their own within 10 days. Sometimes medicines are prescribed to help relieve symptoms (pain medicine, decongestants, nasal steroid sprays, or saline sprays).  However, for sinusitis related to a bacterial infection, your caregiver will prescribe antibiotic medicines. These are medicines that will help kill the bacteria causing the infection.  Rarely, sinusitis is caused by a fungal infection. In theses cases, your caregiver will prescribe antifungal medicine. For some cases of chronic sinusitis, surgery is needed. Generally, these are cases in which sinusitis recurs more than 3 times per year, despite other treatments. HOME CARE INSTRUCTIONS   Drink plenty of water. Water helps thin the mucus so your sinuses can drain more easily.  Use a humidifier.  Inhale steam 3 to 4 times a day (for example, sit in the bathroom with the shower running).  Apply a warm, moist washcloth to your face 3 to 4 times a day, or as directed by your caregiver.  Use saline nasal sprays to help moisten and clean your sinuses.  Take over-the-counter or prescription medicines  for pain, discomfort, or fever only as directed by your caregiver. SEEK IMMEDIATE MEDICAL CARE IF:  You have increasing pain or severe headaches.  You have nausea, vomiting, or drowsiness.  You have swelling around your face.  You have vision problems.  You have a stiff  neck.  You have difficulty breathing. MAKE SURE YOU:   Understand these instructions.  Will watch your condition.  Will get help right away if you are not doing well or get worse. Document Released: 12/18/2004 Document Revised: 03/12/2011 Document Reviewed: 01/02/2011 [facility] Patient Information 2014 [place], [place].

## 2012-11-14 ENCOUNTER — Telehealth: Payer: Self-pay | Admitting: Family Medicine

## 2012-11-14 NOTE — Telephone Encounter (Signed)
Please advise 

## 2012-11-14 NOTE — Telephone Encounter (Signed)
Patient states that she now thinks that she has pink eye from her daughter. She would like to know if Dr. Abner Greenspan would call her in something for this? [facility] in [place].

## 2012-11-15 ENCOUNTER — Other Ambulatory Visit: Payer: Self-pay | Admitting: Family Medicine

## 2012-11-15 DIAGNOSIS — H109 Unspecified conjunctivitis: Secondary | ICD-10-CM

## 2012-11-15 MED ORDER — POLYMYXIN B-TRIMETHOPRIM 10000-0.1 UNIT/ML-% OP SOLN: 1.0000 [drp] | Freq: Four times a day (QID) | OPHTHALMIC | Status: DC | PRN

## 2012-11-15 NOTE — Telephone Encounter (Signed)
Dr Abner Greenspan ordered

## 2012-11-15 NOTE — Telephone Encounter (Signed)
Please see previous note.

## 2012-11-15 NOTE — Telephone Encounter (Signed)
OK to give her Polytrim 1 drop b/l eyes qid  7 days, warm compresses and massage prior to putting drops in disp #1 bottle

## 2012-11-16 NOTE — Assessment & Plan Note (Signed)
Symptoms for nearly a month now with a long history of recurrent infection. Will try Omnicef bid and encouraged mucinex, probiotics and rest

## 2012-11-16 NOTE — Assessment & Plan Note (Signed)
Has had less recurrent infections with the addition of Cetirizine daily, continue same, can increase to bid during a flare

## 2012-11-19 ENCOUNTER — Ambulatory Visit (HOSPITAL_BASED_OUTPATIENT_CLINIC_OR_DEPARTMENT_OTHER): Payer: BC Managed Care – PPO

## 2012-11-26 ENCOUNTER — Other Ambulatory Visit: Payer: Self-pay | Admitting: Family Medicine

## 2012-11-28 NOTE — Telephone Encounter (Signed)
eScribe request for refill on Zolpidem Last filled - 09.26.14, #30x1 Last AEX - 11.11.14, Acute; 08.25.14 CPE Next AEX - 6 Months [from OV] Please Advise on refills for Dr. Einar Grad

## 2012-11-28 NOTE — Telephone Encounter (Signed)
Rx request phoned to pharmacy/[facility]  

## 2012-11-28 NOTE — Telephone Encounter (Signed)
OK to send 30 tabs zero refills.  

## 2012-12-04 ENCOUNTER — Telehealth: Payer: Self-pay | Admitting: Family Medicine

## 2012-12-04 DIAGNOSIS — N760 Acute vaginitis: Secondary | ICD-10-CM

## 2012-12-04 MED ORDER — FLUCONAZOLE 150 MG PO TABS: 150.0000 mg | ORAL_TABLET | ORAL | Status: DC

## 2012-12-04 MED ORDER — CEFDINIR 300 MG PO CAPS: 300.0000 mg | ORAL_CAPSULE | Freq: Two times a day (BID) | ORAL | Status: DC

## 2012-12-04 NOTE — Telephone Encounter (Signed)
Per MD ok to send in Cefdinir bid X 14 days. If pt is no better after the round of antibiotics though pt will have to come in for an appt. Per MD send in 1 tablet of Diflucan  Pt informed and voiced understanding

## 2012-12-04 NOTE — Telephone Encounter (Signed)
Patient states that her sinus infection has come back and would like to know if Dr. Abner Greenspan would call in another round of antibiotics

## 2012-12-22 ENCOUNTER — Telehealth: Payer: Self-pay | Admitting: Family Medicine

## 2012-12-22 NOTE — Telephone Encounter (Signed)
refill-zolpidem tartrate 10mg  tablet. Take one tablet by mouth at bedtime as needed.

## 2012-12-23 MED ORDER — ZOLPIDEM TARTRATE 10 MG PO TABS: 10.0000 mg | ORAL_TABLET | Freq: Every evening | ORAL | Status: DC | PRN

## 2012-12-23 NOTE — Telephone Encounter (Signed)
oK to to refill  Zolpidem 10 mg daily prn insomnia, disp #30 with 2 rf

## 2012-12-23 NOTE — Telephone Encounter (Signed)
RX sent

## 2012-12-23 NOTE — Telephone Encounter (Signed)
Please advise refiill?  Last RX was done on 11-26-12 quantity 30 with 0 refills  If ok fax to [phone]

## 2013-02-26 ENCOUNTER — Ambulatory Visit: Payer: BC Managed Care – PPO | Admitting: Family Medicine

## 2013-03-16 ENCOUNTER — Telehealth: Payer: Self-pay | Admitting: Family Medicine

## 2013-03-16 MED ORDER — ZOLPIDEM TARTRATE 10 MG PO TABS: 10.0000 mg | ORAL_TABLET | Freq: Every evening | ORAL | Status: DC | PRN

## 2013-03-16 NOTE — Telephone Encounter (Signed)
REFILL ZOLPIDEM  PATIENT IS OUT

## 2013-03-16 NOTE — Telephone Encounter (Signed)
Last RX was done on 12-23-12 quantity 30 with 2 refills.  RX printed for MD to sign and then will fax

## 2013-03-20 ENCOUNTER — Ambulatory Visit (INDEPENDENT_AMBULATORY_CARE_PROVIDER_SITE_OTHER): Payer: BC Managed Care – PPO | Admitting: Family Medicine

## 2013-03-20 ENCOUNTER — Encounter: Payer: Self-pay | Admitting: Family Medicine

## 2013-03-20 VITALS — BP 122/76 | HR 73 | Temp 98.0°F | Ht 62.0 in

## 2013-03-20 DIAGNOSIS — T7840XA Allergy, unspecified, initial encounter: Secondary | ICD-10-CM

## 2013-03-20 DIAGNOSIS — J309 Allergic rhinitis, unspecified: Secondary | ICD-10-CM

## 2013-03-20 DIAGNOSIS — F3289 Other specified depressive episodes: Secondary | ICD-10-CM

## 2013-03-20 DIAGNOSIS — F329 Major depressive disorder, single episode, unspecified: Secondary | ICD-10-CM

## 2013-03-20 DIAGNOSIS — J019 Acute sinusitis, unspecified: Secondary | ICD-10-CM

## 2013-03-20 MED ORDER — MONTELUKAST SODIUM 10 MG PO TABS: 10.0000 mg | ORAL_TABLET | Freq: Every day | ORAL | Status: DC

## 2013-03-20 NOTE — Patient Instructions (Signed)

## 2013-03-20 NOTE — Progress Notes (Signed)
Pre visit review using our clinic review tool, if applicable. No additional management support is needed unless otherwise documented below in the visit note. 

## 2013-03-20 NOTE — Progress Notes (Signed)
Patient ID: Jennifer Sandoval, female   DOB: March 06, 1955, 58 y.o.   MRN: [id number] MARABELLE CUSHMAN [id number] 1955-06-17 03/20/2013      Progress Note-Follow Up  Subjective  Chief Complaint  Chief Complaint  Patient presents with  . Follow-up    6 month    HPI  Patient is a 58 year old female in today for routine medical care. Patient is generally doing well. Continues to struggle with high stress at home with his managing well with her current medications. Allergies often flare this time a year but so far only mildly increased. No fevers or chills. No sinus pain or fevers. No other acute complaints. No chest pain, palpitations or shortness of breath.  Past Medical History  Diagnosis Date  . Allergy     rhinitis  . Anemia     iron deficeincy  . Depression   . Lumbar compression fracture     nonsurgical  . SHOULDER PAIN, BILATERAL 10/10/2007  . PERIMENOPAUSAL STATUS 03/16/2010  . OSTEOARTHRITIS 10/24/2006  . Leiomyoma of uterus, unspecified 02/15/2010  . FRACTURE, NOSE 10/22/2007  . DEPRESSION 10/24/2006  . BASAL CELL CARCINOMA SKIN LOWER LIMB INCL HIP 02/15/2010  . ANEMIA-NOS 10/24/2006  . ALLERGIC RHINITIS 08/15/2006  . Dyspepsia 04/19/2010  . Acute sinusitis 07/31/2006  . BCC (basal cell carcinoma of skin) 07/10/2011    h/o  . 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 Insomnia 01/23/2012  . Preventative health care 08/31/2012    Past Surgical History  Procedure Laterality Date  . Septoplasty    . Gastric bypass    . Tubal ligation    . Nasal sinus surgery    . Cesarean section      X 3  . Appendectomy    . Cholecystectomy    . Ventral herniorrhaphy      X 2  . Abdominal hysterectomy  2009    partial, ovaries left in place, for painful, heavy menstrrual bleeding secondary to anemia and fibroids  . Skin biopsy      L hip BCC, facial lesions were benign    Family History  Problem Relation Age of Onset  . Cholelithiasis Mother   . Other Mother     Urinary  incontince/ bladder prolapse/ h/o smoking  . Cancer Mother     laryngeal carcinoma  . Allergies Mother   . Other Father     Renal failure  . Hypertension Father   . Coronary artery disease Father     s/p bypass  . Aortic aneurysm Father   . Hyperlipidemia Father   . Heart disease Father 64    quadruple bypass  . Spina bifida Brother     with stunt/ self cath  . Seizures Brother     disorders  . ADD / ADHD Daughter   . Cancer Maternal Grandmother     colon/ smoker  . Cancer Maternal Grandfather     lung  . Cancer Paternal Grandmother     Breast  . Heart disease Paternal Grandfather   . Stroke Paternal Grandfather   . ADD / ADHD Daughter     History   Social History  . Marital Status: Married    Spouse Name: N/A    Number of Children: N/A  . Years of Education: N/A   Occupational History  . Not on file.   Social History Main Topics  . Smoking status: Never Smoker   . Smokeless tobacco: Never Used  . Alcohol Use: Yes  Comment: special occasion  . Drug Use: No  . Sexual Activity: Yes    Partners: Male   Other Topics Concern  . Not on file   Social History Narrative  . No narrative on file    Current Outpatient Prescriptions on File Prior to Visit  Medication Sig Dispense Refill  . Biotin 10 MG CAPS Take 1 tablet by mouth daily.      . cetirizine (ZYRTEC) 10 MG tablet Take 10 mg by mouth daily.      . fluticasone (FLONASE) 50 MCG/ACT nasal spray 1-2 sprays each nostril once daily for allergies  16 g  3  . Probiotic Product (PROBIOTIC DAILY PO) Take 1 capsule by mouth daily.      . sertraline (ZOLOFT) 100 MG tablet TAKE 1 TABLET (100 MG TOTAL) BY MOUTH DAILY.  90 tablet  1  . vitamin B-12 (CYANOCOBALAMIN) 1000 MCG tablet Take 1,000 mcg by mouth daily.        Marland Kitchen zolpidem (AMBIEN) 10 MG tablet Take 1 tablet (10 mg total) by mouth at bedtime as needed for sleep.  30 tablet  2   Current Facility-Administered Medications on File Prior to Visit  Medication  Dose Route Frequency Provider Last Rate Last Dose  . 0.9 %  sodium chloride infusion  500 mL Intravenous Continuous Sable Feil, MD        Allergies  Allergen Reactions  . Itraconazole     REACTION: Rash  . Levaquin [Levofloxacin In D5w]     Joint pain    Review of Systems  Review of Systems  Constitutional: Negative for fever and malaise/fatigue.  HENT: Positive for congestion.   Eyes: Negative for discharge.  Respiratory: Negative for shortness of breath.   Cardiovascular: Negative for chest pain, palpitations and leg swelling.  Gastrointestinal: Negative for nausea, abdominal pain and diarrhea.  Genitourinary: Negative for dysuria.  Musculoskeletal: Negative for falls.  Skin: Negative for rash.  Neurological: Negative for loss of consciousness and headaches.  Endo/Heme/Allergies: Negative for polydipsia.  Psychiatric/Behavioral: Negative for depression and suicidal ideas. The patient is nervous/anxious. The patient does not have insomnia.     Objective  BP 122/76  Pulse 73  Temp(Src) 98 F (36.7 C) (Oral)  Ht 5\' 2"  (1.575 m)  SpO2 96%  Physical Exam  Physical Exam  Constitutional: She is oriented to person, place, and time and well-developed, well-nourished, and in no distress. No distress.  HENT:  Head: Normocephalic and atraumatic.  Eyes: Conjunctivae are normal.  Neck: Neck supple. No thyromegaly present.  Cardiovascular: Normal rate, regular rhythm and normal heart sounds.   No murmur heard. Pulmonary/Chest: Effort normal and breath sounds normal. She has no wheezes.  Abdominal: She exhibits no distension and no mass.  Musculoskeletal: She exhibits no edema.  Lymphadenopathy:    She has no cervical adenopathy.  Neurological: She is alert and oriented to person, place, and time.  Skin: Skin is warm and dry. No rash noted. She is not diaphoretic.  Psychiatric: Memory, affect and judgment normal.    Lab Results  Component Value Date   TSH 0.98  08/21/2012   Lab Results  Component Value Date   WBC 6.4 08/21/2012   HGB 12.0 08/21/2012   HCT 36.7 08/21/2012   MCV 87.3 08/21/2012   PLT 252.0 08/21/2012   Lab Results  Component Value Date   CREATININE 0.7 08/21/2012   BUN 13 08/21/2012   NA 139 08/21/2012   K 3.8 08/21/2012   CL 106  08/21/2012   CO2 28 08/21/2012   Lab Results  Component Value Date   ALT 13 08/21/2012   AST 23 08/21/2012   ALKPHOS 73 08/21/2012   BILITOT 0.6 08/21/2012   Lab Results  Component Value Date   CHOL 124 08/21/2012   Lab Results  Component Value Date   HDL 70.80 08/21/2012   Lab Results  Component Value Date   LDLCALC 40 08/21/2012   Lab Results  Component Value Date   TRIG 64.0 08/21/2012   Lab Results  Component Value Date   CHOLHDL 2 08/21/2012     Assessment & Plan  DEPRESSION Doing well, no changes  ALLERGIC RHINITIS Continue zyrtec and Flonase and add Singulair   Acute sinusitis No recent flares

## 2013-03-21 ENCOUNTER — Other Ambulatory Visit: Payer: Self-pay | Admitting: Family Medicine

## 2013-03-23 NOTE — Assessment & Plan Note (Signed)
Doing well, no changes 

## 2013-03-23 NOTE — Assessment & Plan Note (Signed)
No recent flares 

## 2013-03-23 NOTE — Assessment & Plan Note (Signed)
Continue zyrtec and Flonase and add Singulair

## 2013-05-07 ENCOUNTER — Telehealth: Payer: Self-pay | Admitting: Family Medicine

## 2013-05-07 NOTE — Telephone Encounter (Signed)
Patient states that she has a sinus infection and says that Dr. Charlett Blake has called in something before for her w/o being seen. She would like to know if Dr. Charlett Blake would call her in something again.

## 2013-05-07 NOTE — Telephone Encounter (Signed)
OK to send in Cefdinir 300 mg po bid x 14 days

## 2013-05-08 MED ORDER — CEFDINIR 300 MG PO CAPS: 300.0000 mg | ORAL_CAPSULE | Freq: Two times a day (BID) | ORAL | Status: DC

## 2013-05-08 NOTE — Telephone Encounter (Signed)
Patient informed and rx sent. 

## 2013-05-28 ENCOUNTER — Other Ambulatory Visit: Payer: Self-pay | Admitting: Family Medicine

## 2013-05-28 ENCOUNTER — Telehealth: Payer: Self-pay | Admitting: Family Medicine

## 2013-05-28 DIAGNOSIS — R0600 Dyspnea, unspecified: Secondary | ICD-10-CM

## 2013-05-28 NOTE — Telephone Encounter (Signed)
Patient states that she is still experiencing SOB. She states that this has been ongoing and that Dr. Charlett Blake had wanted her to have a test done for this. Patient states that she declined to have this test done because of insurance. She would like to proceed with this test.(she did not know name of testing)

## 2013-06-03 ENCOUNTER — Ambulatory Visit (HOSPITAL_BASED_OUTPATIENT_CLINIC_OR_DEPARTMENT_OTHER)
Admission: RE | Admit: 2013-06-03 | Discharge: 2013-06-03 | Disposition: A | Discharge status: Home / Self Care | Payer: BC Managed Care – PPO | Source: Ambulatory Visit | Attending: Family Medicine | Admitting: Family Medicine

## 2013-06-03 DIAGNOSIS — I059 Rheumatic mitral valve disease, unspecified: Secondary | ICD-10-CM | POA: Insufficient documentation

## 2013-06-03 DIAGNOSIS — I079 Rheumatic tricuspid valve disease, unspecified: Secondary | ICD-10-CM | POA: Insufficient documentation

## 2013-06-03 DIAGNOSIS — R079 Chest pain, unspecified: Secondary | ICD-10-CM | POA: Insufficient documentation

## 2013-06-03 DIAGNOSIS — R0600 Dyspnea, unspecified: Secondary | ICD-10-CM

## 2013-06-03 DIAGNOSIS — R0609 Other forms of dyspnea: Secondary | ICD-10-CM | POA: Insufficient documentation

## 2013-06-03 DIAGNOSIS — R0989 Other specified symptoms and signs involving the circulatory and respiratory systems: Secondary | ICD-10-CM | POA: Insufficient documentation

## 2013-06-03 NOTE — Progress Notes (Signed)
Echocardiogram 2D Echocardiogram has been performed.  Jennifer Sandoval 06/03/2013, 11:01 AM

## 2013-06-08 ENCOUNTER — Telehealth: Payer: Self-pay

## 2013-06-08 NOTE — Telephone Encounter (Signed)
Left message for patient to return my call.

## 2013-06-08 NOTE — Telephone Encounter (Signed)
Appointment scheduled for 06/11/13

## 2013-06-08 NOTE — Telephone Encounter (Signed)
Please call and inform pt of mds answer below

## 2013-06-08 NOTE — Telephone Encounter (Signed)
Pt would like to know what she is supposed to do when she gets these "feelings" (heart rate goes up). Pt states with stress and exercise she sometimes gets chest pain, tightness and feels like something sitting on her chest? Pt states she doesn't know if its anxiety? Pt did state that when she lays down it gets better?  Pt is concerned since echo wasn't bad?  Please advise?

## 2013-06-08 NOTE — Telephone Encounter (Signed)
She should probably come in to discuss her symptoms since there is not a simple answer

## 2013-06-10 ENCOUNTER — Telehealth: Payer: Self-pay | Admitting: Family Medicine

## 2013-06-10 NOTE — Telephone Encounter (Signed)
Patient Information:  Caller Name: Gwe  Phone: [phone]  Patient: Jennifer Sandoval, Jennifer Sandoval  Gender: Female  DOB: 05-22-1955  Age: 58 Years  PCP: [facility])  Office Follow Up:  Does the office need to follow up with this patient?: No  Instructions For The Office: N/A   Symptoms  Reason For Call & Symptoms: Patient reports she had echocardiogram last week; has appointment for 06/11/13; office tried to get her to come on 06/10/13 instead.  Patient reports symptoms unchanged from 06/08/13 when she called and was told to schedule appointment (mild palpitations that resolve with rest); she declined to come earlier and does not have time to talk with nurse at this time.  Reviewed Health History In EMR: Yes  Reviewed Medications In EMR: Yes  Reviewed Allergies In EMR: Yes  Reviewed Surgeries / Procedures: Yes  Date of Onset of Symptoms: Unknown  Treatments Tried: Rest  Treatments Tried Worked: Yes  Guideline(s) Used:  No Protocol Available - Sick Adult  Disposition Per Guideline:   See Today or Tomorrow in Office  Reason For Disposition Reached:   Nursing judgment  Advice Given:  Call Back If:  New symptoms develop  You become worse.  RN Overrode Recommendation:  Patient Already Has Appt, Document Patient  Has appointment for 06/11/13 @ 11:15.  States she cannot come in on 06/10/13.

## 2013-06-10 NOTE — Telephone Encounter (Signed)
Called patient to see if she could come in today instead of tomorrow. Patient declined, stating that she was at her mothers doctors appointment. I offered her to talk to one of the nurses. Patient declined, stating that she really didn't have time to talk right now. I asked patient if one of the nurses could call her back. Patient states that later this afternoon would be better. I called [facility] and spoke to [place] and told her the situation. Eliezer Lofts states that she will have someone call patient at 3:00pm or later.

## 2013-06-10 NOTE — Telephone Encounter (Signed)
Thank you :)

## 2013-06-10 NOTE — Telephone Encounter (Signed)
Thanks

## 2013-06-11 ENCOUNTER — Encounter: Payer: Self-pay | Admitting: Physician Assistant

## 2013-06-11 ENCOUNTER — Ambulatory Visit (INDEPENDENT_AMBULATORY_CARE_PROVIDER_SITE_OTHER): Payer: BC Managed Care – PPO | Admitting: Physician Assistant

## 2013-06-11 ENCOUNTER — Telehealth: Payer: Self-pay | Admitting: Family Medicine

## 2013-06-11 VITALS — BP 118/86 | HR 76 | Temp 98.4°F | Resp 16 | Ht 62.0 in | Wt 134.0 lb

## 2013-06-11 DIAGNOSIS — F329 Major depressive disorder, single episode, unspecified: Secondary | ICD-10-CM

## 2013-06-11 DIAGNOSIS — R002 Palpitations: Secondary | ICD-10-CM

## 2013-06-11 DIAGNOSIS — F32A Depression, unspecified: Secondary | ICD-10-CM

## 2013-06-11 DIAGNOSIS — F3289 Other specified depressive episodes: Secondary | ICD-10-CM

## 2013-06-11 LAB — CBC
HEMATOCRIT: 34.5 % — AB (ref 36.0–46.0)
HEMOGLOBIN: 11.6 g/dL — AB (ref 12.0–15.0)
MCH: 27.4 pg (ref 26.0–34.0)
MCHC: 33.6 g/dL (ref 30.0–36.0)
MCV: 81.6 fL (ref 78.0–100.0)
Platelets: 278 10*3/uL (ref 150–400)
RBC: 4.23 MIL/uL (ref 3.87–5.11)
RDW: 15.1 % (ref 11.5–15.5)
WBC: 6.3 10*3/uL (ref 4.0–10.5)

## 2013-06-11 MED ORDER — SERTRALINE HCL 100 MG PO TABS: 150.0000 mg | ORAL_TABLET | Freq: Every day | ORAL | Status: DC

## 2013-06-11 NOTE — Telephone Encounter (Signed)
Patient saw cody today and forgot to ask him if he could refill zolpidem. [facility]

## 2013-06-11 NOTE — Patient Instructions (Signed)
Please continue medications as directed.  The Zoloft I have increased to a total of 150 mg daily (1.5 tablets).  You will be contacted for Holter Monitor placement. Please obtain labs.  I will call you with your results.  Follow-up in 3-4 weeks to reassess stress/anxiety after increasing medication.  If you develop chest pain, shortness of breath, persistent palpitations, please proceed to the ER.

## 2013-06-11 NOTE — Progress Notes (Signed)
Pre visit review using our clinic review tool, if applicable. No additional management support is needed unless otherwise documented below in the visit note/SLS  

## 2013-06-12 ENCOUNTER — Telehealth: Payer: Self-pay | Admitting: *Deleted

## 2013-06-12 LAB — BASIC METABOLIC PANEL
BUN: 14 mg/dL (ref 6–23)
CO2: 27 meq/L (ref 19–32)
CREATININE: 0.63 mg/dL (ref 0.50–1.10)
Calcium: 9.3 mg/dL (ref 8.4–10.5)
Chloride: 103 mEq/L (ref 96–112)
GLUCOSE: 76 mg/dL (ref 70–99)
Potassium: 4.1 mEq/L (ref 3.5–5.3)
Sodium: 140 mEq/L (ref 135–145)

## 2013-06-12 LAB — TSH: TSH: 0.971 u[IU]/mL (ref 0.350–4.500)

## 2013-06-12 NOTE — Telephone Encounter (Signed)
Refill request for Zolpidem Last filled by MD on - 03.16.15, #30x2 Last AEX - 03.20.15 Next AEX - 6-mths Please Advise on refills/[facility]

## 2013-06-12 NOTE — Telephone Encounter (Signed)
Last Rx 03.16.15, #30x2, too soon & this needs to be requested from PCP; will send request to Dr. Reatha Armour

## 2013-06-14 DIAGNOSIS — R002 Palpitations: Secondary | ICD-10-CM | POA: Insufficient documentation

## 2013-06-14 NOTE — Progress Notes (Signed)
Patient presents to clinic today c/o "palpitations" that have been present intermittently over the past year.  Palpitations occur randomly and last for up to a few minutes.  Patient denies chest pain, chest tightness, SOB or diaphoresis.  Patient with history of anxiety and depression.  No known history of panic attack.  Patient endorses recent increase in stressors over the past few weeks, coinciding with return of palpitations.  Patient without CV PMH.  Saw her PCP last week for CPE and had labs drawn, including TSH which was within normal limits.  Past Medical History  Diagnosis Date  . Allergy     rhinitis  . Anemia     iron deficeincy  . Depression   . Lumbar compression fracture     nonsurgical  . SHOULDER PAIN, BILATERAL 10/10/2007  . PERIMENOPAUSAL STATUS 03/16/2010  . OSTEOARTHRITIS 10/24/2006  . Leiomyoma of uterus, unspecified 02/15/2010  . FRACTURE, NOSE 10/22/2007  . DEPRESSION 10/24/2006  . BASAL CELL CARCINOMA SKIN LOWER LIMB INCL HIP 02/15/2010  . ANEMIA-NOS 10/24/2006  . ALLERGIC RHINITIS 08/15/2006  . Dyspepsia 04/19/2010  . Acute sinusitis 07/31/2006  . BCC (basal cell carcinoma of skin) 07/10/2011    h/o  . 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 Insomnia 01/23/2012  . Preventative health care 08/31/2012    Current Outpatient Prescriptions on File Prior to Visit  Medication Sig Dispense Refill  . Biotin 10 MG CAPS Take 1 tablet by mouth daily.      . cetirizine (ZYRTEC) 10 MG tablet Take 10 mg by mouth daily.      . fluticasone (FLONASE) 50 MCG/ACT nasal spray 1-2 sprays each nostril once daily for allergies  16 g  3  . Probiotic Product (PROBIOTIC DAILY PO) Take 1 capsule by mouth daily.      . vitamin B-12 (CYANOCOBALAMIN) 1000 MCG tablet Take 1,000 mcg by mouth daily.        Marland Kitchen zolpidem (AMBIEN) 10 MG tablet Take 1 tablet (10 mg total) by mouth at bedtime as needed for sleep.  30 tablet  2   Current Facility-Administered Medications on File Prior to Visit   Medication Dose Route Frequency Provider Last Rate Last Dose  . 0.9 %  sodium chloride infusion  500 mL Intravenous Continuous Sable Feil, MD        Allergies  Allergen Reactions  . Itraconazole     REACTION: Rash  . Levaquin [Levofloxacin In D5w]     Joint pain    Family History  Problem Relation Age of Onset  . Cholelithiasis Mother   . Other Mother     Urinary incontince/ bladder prolapse/ h/o smoking  . Cancer Mother     laryngeal carcinoma  . Allergies Mother   . Other Father     Renal failure  . Hypertension Father   . Coronary artery disease Father     s/p bypass  . Aortic aneurysm Father   . Hyperlipidemia Father   . Heart disease Father 63    quadruple bypass  . Spina bifida Brother     with stunt/ self cath  . Seizures Brother     disorders  . ADD / ADHD Daughter   . Cancer Maternal Grandmother     colon/ smoker  . Cancer Maternal Grandfather     lung  . Cancer Paternal Grandmother     Breast  . Heart disease Paternal Grandfather   . Stroke Paternal Grandfather   . ADD / ADHD Daughter  History   Social History  . Marital Status: Married    Spouse Name: N/A    Number of Children: N/A  . Years of Education: N/A   Social History Main Topics  . Smoking status: Never Smoker   . Smokeless tobacco: Never Used  . Alcohol Use: Yes     Comment: special occasion  . Drug Use: No  . Sexual Activity: Yes    Partners: Male   Other Topics Concern  . None   Social History Narrative  . None    Review of Systems - See HPI.  All other ROS are negative.  BP 118/86  Pulse 76  Temp(Src) 98.4 F (36.9 C) (Oral)  Resp 16  Ht 5\' 2"  (1.575 m)  Wt 134 lb (60.782 kg)  BMI 24.50 kg/m2  SpO2 97%  Physical Exam  Vitals reviewed. Constitutional: She is oriented to person, place, and time and well-developed, well-nourished, and in no distress.  HENT:  Head: Normocephalic and atraumatic.  Eyes: Conjunctivae are normal. Pupils are equal, round,  and reactive to light.  Neck: Neck supple. No thyromegaly present.  Cardiovascular: Normal rate, regular rhythm, normal heart sounds and intact distal pulses.   No murmur heard. Pulmonary/Chest: Effort normal and breath sounds normal. No respiratory distress. She has no wheezes. She has no rales. She exhibits no tenderness.  Lymphadenopathy:    She has no cervical adenopathy.  Neurological: She is alert and oriented to person, place, and time.  Skin: Skin is warm and dry. No rash noted.  Psychiatric: Affect normal.    Recent Results (from the past 2160 hour(s))  CBC     Status: Abnormal   Collection Time    06/11/13 12:09 PM      Result Value Ref Range   WBC 6.3  4.0 - 10.5 K/uL   RBC 4.23  3.87 - 5.11 MIL/uL   Hemoglobin 11.6 (*) 12.0 - 15.0 g/dL   HCT 34.5 (*) 36.0 - 46.0 %   MCV 81.6  78.0 - 100.0 fL   MCH 27.4  26.0 - 34.0 pg   MCHC 33.6  30.0 - 36.0 g/dL   RDW 15.1  11.5 - 15.5 %   Platelets 278  150 - 400 K/uL  TSH     Status: None   Collection Time    06/11/13 12:09 PM      Result Value Ref Range   TSH 0.971  0.350 - 4.500 uIU/mL  BASIC METABOLIC PANEL     Status: None   Collection Time    06/11/13 12:09 PM      Result Value Ref Range   Sodium 140  135 - 145 mEq/L   Potassium 4.1  3.5 - 5.3 mEq/L   Chloride 103  96 - 112 mEq/L   CO2 27  19 - 32 mEq/L   Glucose, Bld 76  70 - 99 mg/dL   BUN 14  6 - 23 mg/dL   Creat 0.63  0.50 - 1.10 mg/dL   Calcium 9.3  8.4 - 10.5 mg/dL    Assessment/Plan: Palpitations Likely sinus tachycardia 2/2 anxiety.  EKG reveals NSR.  Recent labs within normal limits.  Will order Holter Monitor.  Increase Sertraline to 150 mg daily to give better control of anxiety.  Follow-up in 4 weeks.

## 2013-06-14 NOTE — Assessment & Plan Note (Signed)
Likely sinus tachycardia 2/2 anxiety.  EKG reveals NSR.  Recent labs within normal limits.  Will order Holter Monitor.  Increase Sertraline to 150 mg daily to give better control of anxiety.  Follow-up in 4 weeks.

## 2013-06-14 NOTE — Telephone Encounter (Signed)
OK to refill Zolpidem same strength same sig, Disp #30 with 2 rf

## 2013-06-15 MED ORDER — ZOLPIDEM TARTRATE 10 MG PO TABS: 10.0000 mg | ORAL_TABLET | Freq: Every evening | ORAL | Status: DC | PRN

## 2013-06-15 NOTE — Telephone Encounter (Signed)
Rx request phoned to pharmacy/[facility]  

## 2013-06-18 ENCOUNTER — Encounter: Payer: Self-pay | Admitting: Radiology

## 2013-06-18 ENCOUNTER — Encounter (INDEPENDENT_AMBULATORY_CARE_PROVIDER_SITE_OTHER): Payer: BC Managed Care – PPO

## 2013-06-18 DIAGNOSIS — R002 Palpitations: Secondary | ICD-10-CM

## 2013-06-18 NOTE — Progress Notes (Signed)
Patient ID: Jennifer Sandoval, female   DOB: [birth year], 58 y.o.   MRN: [id number] E cardio 24 hr Holter applied

## 2013-07-02 ENCOUNTER — Ambulatory Visit (INDEPENDENT_AMBULATORY_CARE_PROVIDER_SITE_OTHER): Payer: BC Managed Care – PPO | Admitting: Physician Assistant

## 2013-07-02 ENCOUNTER — Encounter: Payer: Self-pay | Admitting: Physician Assistant

## 2013-07-02 VITALS — BP 118/76 | HR 70 | Temp 98.4°F | Resp 14 | Ht 62.0 in | Wt 135.0 lb

## 2013-07-02 DIAGNOSIS — R002 Palpitations: Secondary | ICD-10-CM

## 2013-07-02 NOTE — Assessment & Plan Note (Addendum)
Patient endorses taking medication as directed with improvement in symptoms.  Holter results still pending. Cardiology contacted about results and final read is pending.  Preliminary results with 4 PACs and 1 PVCs.  No other abnormal findings..  Continue current regimen.  Avoid caffeine or alcoholic beverages.  Follow-up if symptoms do not continue to improve. May can consider low-dose BB.

## 2013-07-02 NOTE — Patient Instructions (Signed)
Please continue the Sertraline as directed. Avoid caffeineated or alcoholic beverages.  I will let you know once we have received your Holter Monitor results.  They will be faxing over the preliminary results to [place], but we still need the final read from the Cardiologist.

## 2013-07-02 NOTE — Progress Notes (Signed)
Patient presents to clinic today c/o for 3 week follow-up of palpitations. She states feels better since increase of Sertraline to 150 mg.  Endorses improvement in anxiety. "Palpitations" have occurred about 2 - 3 times since then.  Less than 1 minute in duration. Denies chest pain or SOB.  Holter monitor was performed.  Results are still pending.   Past Medical History  Diagnosis Date  . Allergy     rhinitis  . Anemia     iron deficeincy  . Depression   . Lumbar compression fracture     nonsurgical  . SHOULDER PAIN, BILATERAL 10/10/2007  . PERIMENOPAUSAL STATUS 03/16/2010  . OSTEOARTHRITIS 10/24/2006  . Leiomyoma of uterus, unspecified 02/15/2010  . FRACTURE, NOSE 10/22/2007  . DEPRESSION 10/24/2006  . BASAL CELL CARCINOMA SKIN LOWER LIMB INCL HIP 02/15/2010  . ANEMIA-NOS 10/24/2006  . ALLERGIC RHINITIS 08/15/2006  . Dyspepsia 04/19/2010  . Acute sinusitis 07/31/2006  . BCC (basal cell carcinoma of skin) 07/10/2011    h/o  . 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 Insomnia 01/23/2012  . Preventative health care 08/31/2012    Current Outpatient Prescriptions on File Prior to Visit  Medication Sig Dispense Refill  . Biotin 10 MG CAPS Take 1 tablet by mouth daily.      . cetirizine (ZYRTEC) 10 MG tablet Take 10 mg by mouth daily.      . fluticasone (FLONASE) 50 MCG/ACT nasal spray 1-2 sprays each nostril once daily for allergies  16 g  3  . Probiotic Product (PROBIOTIC DAILY PO) Take 1 capsule by mouth daily.      . sertraline (ZOLOFT) 100 MG tablet Take 1.5 tablets (150 mg total) by mouth daily.  90 tablet  1  . vitamin B-12 (CYANOCOBALAMIN) 1000 MCG tablet Take 1,000 mcg by mouth daily.        Marland Kitchen zolpidem (AMBIEN) 10 MG tablet Take 1 tablet (10 mg total) by mouth at bedtime as needed for sleep.  30 tablet  2   Current Facility-Administered Medications on File Prior to Visit  Medication Dose Route Frequency Provider Last Rate Last Dose  . 0.9 %  sodium chloride infusion  500 mL  Intravenous Continuous Sable Feil, MD        Allergies  Allergen Reactions  . Itraconazole     REACTION: Rash  . Levaquin [Levofloxacin In D5w]     Joint pain    Family History  Problem Relation Age of Onset  . Cholelithiasis Mother   . Other Mother     Urinary incontince/ bladder prolapse/ h/o smoking  . Cancer Mother     laryngeal carcinoma  . Allergies Mother   . Other Father     Renal failure  . Hypertension Father   . Coronary artery disease Father     s/p bypass  . Aortic aneurysm Father   . Hyperlipidemia Father   . Heart disease Father 45    quadruple bypass  . Spina bifida Brother     with stunt/ self cath  . Seizures Brother     disorders  . ADD / ADHD Daughter   . Cancer Maternal Grandmother     colon/ smoker  . Cancer Maternal Grandfather     lung  . Cancer Paternal Grandmother     Breast  . Heart disease Paternal Grandfather   . Stroke Paternal Grandfather   . ADD / ADHD Daughter     History   Social History  . Marital Status: Married  Spouse Name: N/A    Number of Children: N/A  . Years of Education: N/A   Social History Main Topics  . Smoking status: Never Smoker   . Smokeless tobacco: Never Used  . Alcohol Use: Yes     Comment: special occasion  . Drug Use: No  . Sexual Activity: Yes    Partners: Male   Other Topics Concern  . None   Social History Narrative  . None   Review of Systems - See HPI.  All other ROS are negative.  BP 118/76  Pulse 70  Temp(Src) 98.4 F (36.9 C) (Oral)  Resp 14  Ht 5\' 2"  (1.575 m)  Wt 135 lb (61.236 kg)  BMI 24.69 kg/m2  SpO2 98%  Physical Exam  Vitals reviewed. Constitutional: She is oriented to person, place, and time and well-developed, well-nourished, and in no distress.  HENT:  Head: Normocephalic and atraumatic.  Eyes: Conjunctivae are normal. Pupils are equal, round, and reactive to light.  Cardiovascular: Normal rate, regular rhythm, normal heart sounds and intact distal  pulses.   Pulmonary/Chest: Effort normal and breath sounds normal. No respiratory distress. She has no wheezes. She has no rales. She exhibits no tenderness.  Neurological: She is alert and oriented to person, place, and time.  Skin: Skin is warm and dry. No rash noted.  Psychiatric: Affect normal.   Recent Results (from the past 2160 hour(s))  CBC     Status: Abnormal   Collection Time    06/11/13 12:09 PM      Result Value Ref Range   WBC 6.3  4.0 - 10.5 K/uL   RBC 4.23  3.87 - 5.11 MIL/uL   Hemoglobin 11.6 (*) 12.0 - 15.0 g/dL   HCT 34.5 (*) 36.0 - 46.0 %   MCV 81.6  78.0 - 100.0 fL   MCH 27.4  26.0 - 34.0 pg   MCHC 33.6  30.0 - 36.0 g/dL   RDW 15.1  11.5 - 15.5 %   Platelets 278  150 - 400 K/uL  TSH     Status: None   Collection Time    06/11/13 12:09 PM      Result Value Ref Range   TSH 0.971  0.350 - 4.500 uIU/mL  BASIC METABOLIC PANEL     Status: None   Collection Time    06/11/13 12:09 PM      Result Value Ref Range   Sodium 140  135 - 145 mEq/L   Potassium 4.1  3.5 - 5.3 mEq/L   Chloride 103  96 - 112 mEq/L   CO2 27  19 - 32 mEq/L   Glucose, Bld 76  70 - 99 mg/dL   BUN 14  6 - 23 mg/dL   Creat 0.63  0.50 - 1.10 mg/dL   Calcium 9.3  8.4 - 10.5 mg/dL   Assessment/Plan: Palpitations Patient endorses taking medication as directed with improvement in symptoms.  Holter results still pending. Cardiology contacted about results and final read is pending.  Preliminary results with 4 PACs and 1 PVCs.  No other abnormal findings..  Continue current regimen.  Avoid caffeine or alcoholic beverages.  Follow-up if symptoms do not continue to improve. May can consider low-dose BB.     c

## 2013-07-02 NOTE — Progress Notes (Signed)
Pre visit review using our clinic review tool, if applicable. No additional management support is needed unless otherwise documented below in the visit note/SLS  

## 2013-07-13 ENCOUNTER — Telehealth: Payer: Self-pay

## 2013-07-13 NOTE — Telephone Encounter (Signed)
pt aware of holter monitor results. Normal, Benign. pt verbalized understanding.

## 2013-09-04 ENCOUNTER — Telehealth: Payer: Self-pay | Admitting: Family Medicine

## 2013-09-04 MED ORDER — CEFDINIR 300 MG PO CAPS: 300.0000 mg | ORAL_CAPSULE | Freq: Two times a day (BID) | ORAL | Status: DC

## 2013-09-04 NOTE — Telephone Encounter (Signed)
She can have a course of Cefdinir 300 mg po bid x 10 days and remind her Encouraged increased rest and hydration, add probiotics, zinc such as Coldeze or Xicam. Treat fevers as needed. Come in if no improvement

## 2013-09-04 NOTE — Telephone Encounter (Signed)
Please advise 

## 2013-09-04 NOTE — Telephone Encounter (Signed)
Pt informed and voiced understanding rx sent

## 2013-09-04 NOTE — Telephone Encounter (Signed)
Caller name: [name]  Call back Keener  Reason for call:  Pt went to [facility] on 8/29 for sinus infection.  Did get Augmentin, and it helped.  Today the sinus infection came back and she wants to know if she can  Get something else stronger called in or does she need to see [place] in office first.  Please advise.

## 2013-09-08 ENCOUNTER — Other Ambulatory Visit: Payer: Self-pay | Admitting: Physician Assistant

## 2013-09-09 ENCOUNTER — Other Ambulatory Visit: Payer: Self-pay | Admitting: Family Medicine

## 2013-09-10 NOTE — Telephone Encounter (Signed)
faxed

## 2013-09-10 NOTE — Telephone Encounter (Signed)
Last zolpidem rx printed 08/11/13, #30 x 2 refills. Rx printed and forwarded to [facility] for signature.

## 2013-09-18 ENCOUNTER — Other Ambulatory Visit (INDEPENDENT_AMBULATORY_CARE_PROVIDER_SITE_OTHER): Payer: BC Managed Care – PPO

## 2013-09-18 DIAGNOSIS — Z0000 Encounter for general adult medical examination without abnormal findings: Secondary | ICD-10-CM

## 2013-09-18 LAB — RENAL FUNCTION PANEL
ALBUMIN: 4.3 g/dL (ref 3.5–5.2)
BUN: 16 mg/dL (ref 6–23)
CALCIUM: 9.2 mg/dL (ref 8.4–10.5)
CO2: 27 mEq/L (ref 19–32)
Chloride: 106 mEq/L (ref 96–112)
Creatinine, Ser: 0.9 mg/dL (ref 0.4–1.2)
GFR: 71.34 mL/min (ref >60.00)
GLUCOSE: 92 mg/dL (ref 70–99)
Phosphorus: 5.1 mg/dL — ABNORMAL HIGH (ref 2.3–4.6)
Potassium: 3.9 mEq/L (ref 3.5–5.1)
SODIUM: 139 meq/L (ref 135–145)

## 2013-09-18 LAB — LIPID PANEL
CHOL/HDL RATIO: 2
Cholesterol: 140 mg/dL (ref 0–200)
HDL: 67.3 mg/dL (ref >39.00)
LDL Cholesterol: 62 mg/dL (ref 0–99)
NonHDL: 72.7
Triglycerides: 55 mg/dL (ref 0.0–149.0)
VLDL: 11 mg/dL (ref 0.0–40.0)

## 2013-09-18 LAB — HEPATIC FUNCTION PANEL
ALT: 17 U/L (ref 0–35)
AST: 31 U/L (ref 0–37)
Albumin: 4.3 g/dL (ref 3.5–5.2)
Alkaline Phosphatase: 83 U/L (ref 39–117)
Bilirubin, Direct: 0.1 mg/dL (ref 0.0–0.3)
TOTAL PROTEIN: 6.8 g/dL (ref 6.0–8.3)
Total Bilirubin: 0.5 mg/dL (ref 0.2–1.2)

## 2013-09-18 LAB — CBC WITH DIFFERENTIAL/PLATELET
BASOS PCT: 0.4 % (ref 0.0–3.0)
Basophils Absolute: 0 10*3/uL (ref 0.0–0.1)
EOS ABS: 0.1 10*3/uL (ref 0.0–0.7)
Eosinophils Relative: 2.3 % (ref 0.0–5.0)
HCT: 36.3 % (ref 36.0–46.0)
HEMOGLOBIN: 11.8 g/dL — AB (ref 12.0–15.0)
Lymphocytes Relative: 35.8 % (ref 12.0–46.0)
Lymphs Abs: 2 10*3/uL (ref 0.7–4.0)
MCHC: 32.4 g/dL (ref 30.0–36.0)
MCV: 83.1 fl (ref 78.0–100.0)
MONO ABS: 0.4 10*3/uL (ref 0.1–1.0)
Monocytes Relative: 6.8 % (ref 3.0–12.0)
Neutro Abs: 3 10*3/uL (ref 1.4–7.7)
Neutrophils Relative %: 54.7 % (ref 43.0–77.0)
Platelets: 265 10*3/uL (ref 150.0–400.0)
RBC: 4.38 Mil/uL (ref 3.87–5.11)
RDW: 16 % — ABNORMAL HIGH (ref 11.5–15.5)
WBC: 5.5 10*3/uL (ref 4.0–10.5)

## 2013-09-18 LAB — TSH: TSH: 1.33 u[IU]/mL (ref 0.35–4.50)

## 2013-09-25 ENCOUNTER — Ambulatory Visit (INDEPENDENT_AMBULATORY_CARE_PROVIDER_SITE_OTHER): Payer: BC Managed Care – PPO | Admitting: Family Medicine

## 2013-09-25 ENCOUNTER — Encounter: Payer: Self-pay | Admitting: Family Medicine

## 2013-09-25 VITALS — BP 129/84 | HR 85 | Temp 98.3°F | Ht 62.0 in | Wt 133.2 lb

## 2013-09-25 DIAGNOSIS — Z23 Encounter for immunization: Secondary | ICD-10-CM

## 2013-09-25 DIAGNOSIS — F3289 Other specified depressive episodes: Secondary | ICD-10-CM

## 2013-09-25 DIAGNOSIS — F329 Major depressive disorder, single episode, unspecified: Secondary | ICD-10-CM

## 2013-09-25 DIAGNOSIS — R799 Abnormal finding of blood chemistry, unspecified: Secondary | ICD-10-CM

## 2013-09-25 DIAGNOSIS — R002 Palpitations: Secondary | ICD-10-CM

## 2013-09-25 DIAGNOSIS — R7989 Other specified abnormal findings of blood chemistry: Secondary | ICD-10-CM

## 2013-09-25 DIAGNOSIS — Z780 Asymptomatic menopausal state: Secondary | ICD-10-CM

## 2013-09-25 DIAGNOSIS — Z0000 Encounter for general adult medical examination without abnormal findings: Secondary | ICD-10-CM

## 2013-09-25 LAB — PHOSPHORUS: PHOSPHORUS: 3.5 mg/dL (ref 2.3–4.6)

## 2013-09-25 NOTE — Patient Instructions (Signed)
Vitamin d, probiotic such as Digestive Advantage, krill oil such as MegaRed daily  Generalized Anxiety Disorder Generalized anxiety disorder (GAD) is a mental disorder. It interferes with life functions, including relationships, work, and school. GAD is different from normal anxiety, which everyone experiences at some point in their lives in response to specific life events and activities. Normal anxiety actually helps [place] prepare for and get through these life events and activities. Normal anxiety goes away after the event or activity is over.  GAD causes anxiety that is not necessarily related to specific events or activities. It also causes excess anxiety in proportion to specific events or activities. The anxiety associated with GAD is also difficult to control. GAD can vary from mild to severe. People with severe GAD can have intense waves of anxiety with physical symptoms (panic attacks).  SYMPTOMS The anxiety and worry associated with GAD are difficult to control. This anxiety and worry are related to many life events and activities and also occur more days than not for 6 months or longer. People with GAD also have three or more of the following symptoms (one or more in children):  Restlessness.   Fatigue.  Difficulty concentrating.   Irritability.  Muscle tension.  Difficulty sleeping or unsatisfying sleep. DIAGNOSIS GAD is diagnosed through an assessment by your health care provider. Your health care provider will ask you questions aboutyour mood,physical symptoms, and events in your life. Your health care provider may ask you about your medical history and use of alcohol or drugs, including prescription medicines. Your health care provider may also do a physical exam and blood tests. Certain medical conditions and the use of certain substances can cause symptoms similar to those associated with GAD. Your health care provider may refer you to a mental health specialist for further  evaluation. TREATMENT The following therapies are usually used to treat GAD:   Medication. Antidepressant medication usually is prescribed for long-term daily control. Antianxiety medicines may be added in severe cases, especially when panic attacks occur.   Talk therapy (psychotherapy). Certain types of talk therapy can be helpful in treating GAD by providing support, education, and guidance. A form of talk therapy called cognitive behavioral therapy can teach you healthy ways to think about and react to daily life events and activities.  Stress managementtechniques. These include yoga, meditation, and exercise and can be very helpful when they are practiced regularly. A mental health specialist can help determine which treatment is best for you. Some people see improvement with one therapy. However, other people require a combination of therapies. Document Released: 04/14/2012 Document Revised: 05/04/2013 Document Reviewed: 04/14/2012 [facility] Patient Information 2015 [place], [place]. This information is not intended to replace advice given to you by your health care provider. Make sure you discuss any questions you have with your health care provider.

## 2013-09-25 NOTE — Progress Notes (Signed)
Pre visit review using our clinic review tool, if applicable. No additional management support is needed unless otherwise documented below in the visit note. 

## 2013-09-27 DIAGNOSIS — R7989 Other specified abnormal findings of blood chemistry: Secondary | ICD-10-CM | POA: Insufficient documentation

## 2013-09-27 NOTE — Assessment & Plan Note (Signed)
Resolved on recheck 

## 2013-09-27 NOTE — Assessment & Plan Note (Signed)
No recent flares 

## 2013-09-27 NOTE — Assessment & Plan Note (Signed)
Patient encouraged to maintain heart healthy diet, regular exercise, adequate sleep. Consider daily probiotics. Take medications as prescribed. Colonoscopy was in 2012, next in 2022. Flu shot today. Labs reviewed

## 2013-09-27 NOTE — Assessment & Plan Note (Signed)
Doing well on current meds.

## 2013-09-27 NOTE — Progress Notes (Signed)
Patient ID: Jennifer Sandoval, female   DOB: June 01, 1955, 58 y.o.   MRN: [id number] Jennifer Sandoval [id number] [birth year] 09/27/2013      Progress Note-Follow Up  Subjective  Chief Complaint  Chief Complaint  Patient presents with  . Annual Exam    physical  . Injections    flu    HPI  Patient is a 58 year old female in today for routine medical care. Patient is in today for annual exam and is doing fairly well. Is given a artificial sweeteners to the am. Is not taking any sodas all. Reports her stomach is somewhat better. She does note some mild urinary hesitancy but no dysuria. She continues to struggle with her and her daughters but is generally doing well. Procedure depression and anxiety are manageable at this time on current meds. No acute concerns. Denies CP/palp/SOB/HA/congestion/fevers/GI or GU c/o. Taking meds as prescribed  Past Medical History  Diagnosis Date  . Allergy     rhinitis  . Anemia     iron deficeincy  . Depression   . Lumbar compression fracture     nonsurgical  . SHOULDER PAIN, BILATERAL 10/10/2007  . PERIMENOPAUSAL STATUS 03/16/2010  . OSTEOARTHRITIS 10/24/2006  . Leiomyoma of uterus, unspecified 02/15/2010  . FRACTURE, NOSE 10/22/2007  . DEPRESSION 10/24/2006  . BASAL CELL CARCINOMA SKIN LOWER LIMB INCL HIP 02/15/2010  . ANEMIA-NOS 10/24/2006  . ALLERGIC RHINITIS 08/15/2006  . Dyspepsia 04/19/2010  . Acute sinusitis 07/31/2006  . BCC (basal cell carcinoma of skin) 07/10/2011    h/o  . 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 Insomnia 01/23/2012  . Preventative health care 08/31/2012    Past Surgical History  Procedure Laterality Date  . Septoplasty    . Gastric bypass    . Tubal ligation    . Nasal sinus surgery    . Cesarean section      X 3  . Appendectomy    . Cholecystectomy    . Ventral herniorrhaphy      X 2  . Abdominal hysterectomy  2009    partial, ovaries left in place, for painful, heavy menstrrual bleeding secondary to anemia  and fibroids  . Skin biopsy      L hip BCC, facial lesions were benign    Family History  Problem Relation Age of Onset  . Cholelithiasis Mother   . Other Mother     Urinary incontince/ bladder prolapse/ h/o smoking  . Cancer Mother     laryngeal carcinoma  . Allergies Mother   . Arthritis Mother     s/p knee and shoulder replacement  . Dementia Mother     lewy body  . Other Father     Renal failure  . Hypertension Father   . Coronary artery disease Father     s/p bypass  . Aortic aneurysm Father   . Hyperlipidemia Father   . Heart disease Father 58    quadruple bypass  . Spina bifida Brother     with stunt/ self cath  . Seizures Brother     disorders  . ADD / ADHD Daughter   . Cancer Maternal Grandmother     colon/ smoker  . Cancer Maternal Grandfather     lung  . Cancer Paternal Grandmother     Breast  . Heart disease Paternal Grandfather   . Stroke Paternal Grandfather   . ADD / ADHD Daughter     History   Social History  . Marital Status: Married  Spouse Name: N/A    Number of Children: N/A  . Years of Education: N/A   Occupational History  . Not on file.   Social History Main Topics  . Smoking status: Never Smoker   . Smokeless tobacco: Never Used  . Alcohol Use: Yes     Comment: special occasion  . Drug Use: No  . Sexual Activity: Yes    Partners: Male   Other Topics Concern  . Not on file   Social History Narrative  . No narrative on file    Current Outpatient Prescriptions on File Prior to Visit  Medication Sig Dispense Refill  . Biotin 10 MG CAPS Take 1 tablet by mouth daily.      . cetirizine (ZYRTEC) 10 MG tablet Take 10 mg by mouth daily.      . fluticasone (FLONASE) 50 MCG/ACT nasal spray 1-2 sprays each nostril once daily for allergies  16 g  3  . Probiotic Product (PROBIOTIC DAILY PO) Take 1 capsule by mouth daily.      . sertraline (ZOLOFT) 100 MG tablet TAKE 1.5 TABLETS (150 MG TOTAL) BY MOUTH DAILY.  90 tablet  1  .  vitamin B-12 (CYANOCOBALAMIN) 1000 MCG tablet Take 1,000 mcg by mouth daily.        Marland Kitchen zolpidem (AMBIEN) 10 MG tablet TAKE 1 TABLET EVERY NIGHT AS NEEDED FOR SLEEP  30 tablet  2   Current Facility-Administered Medications on File Prior to Visit  Medication Dose Route Frequency Provider Last Rate Last Dose  . 0.9 %  sodium chloride infusion  500 mL Intravenous Continuous Sable Feil, MD        Allergies  Allergen Reactions  . Itraconazole     REACTION: Rash  . Levaquin [Levofloxacin In D5w]     Joint pain    Review of Systems  Review of Systems  Constitutional: Negative for fever and malaise/fatigue.  HENT: Negative for congestion.   Eyes: Negative for discharge.  Respiratory: Negative for shortness of breath.   Cardiovascular: Negative for chest pain, palpitations and leg swelling.  Gastrointestinal: Negative for nausea, abdominal pain and diarrhea.  Genitourinary: Negative for dysuria.  Musculoskeletal: Negative for falls.  Skin: Negative for rash.  Neurological: Negative for loss of consciousness and headaches.  Endo/Heme/Allergies: Negative for polydipsia.  Psychiatric/Behavioral: Negative for depression and suicidal ideas. The patient is nervous/anxious. The patient does not have insomnia.     Objective  BP 129/84  Pulse 85  Temp(Src) 98.3 F (36.8 C) (Oral)  Ht 5\' 2"  (1.575 m)  Wt 133 lb 3.2 oz (60.419 kg)  BMI 24.36 kg/m2  SpO2 98%  Physical Exam  Physical Exam  Constitutional: She is oriented to person, place, and time and well-developed, well-nourished, and in no distress. No distress.  HENT:  Head: Normocephalic and atraumatic.  Eyes: Conjunctivae are normal.  Neck: Neck supple. No thyromegaly present.  Cardiovascular: Normal rate, regular rhythm and normal heart sounds.   No murmur heard. Pulmonary/Chest: Effort normal and breath sounds normal. She has no wheezes.  Abdominal: She exhibits no distension and no mass.  Musculoskeletal: She exhibits  no edema.  Lymphadenopathy:    She has no cervical adenopathy.  Neurological: She is alert and oriented to person, place, and time.  Skin: Skin is warm and dry. No rash noted. She is not diaphoretic.  Psychiatric: Memory, affect and judgment normal.    Lab Results  Component Value Date   TSH 1.33 09/18/2013   Lab Results  Component Value Date   WBC 5.5 09/18/2013   HGB 11.8* 09/18/2013   HCT 36.3 09/18/2013   MCV 83.1 09/18/2013   PLT 265.0 09/18/2013   Lab Results  Component Value Date   CREATININE 0.9 09/18/2013   BUN 16 09/18/2013   NA 139 09/18/2013   K 3.9 09/18/2013   CL 106 09/18/2013   CO2 27 09/18/2013   Lab Results  Component Value Date   ALT 17 09/18/2013   AST 31 09/18/2013   ALKPHOS 83 09/18/2013   BILITOT 0.5 09/18/2013   Lab Results  Component Value Date   CHOL 140 09/18/2013   Lab Results  Component Value Date   HDL 67.30 09/18/2013   Lab Results  Component Value Date   LDLCALC 62 09/18/2013   Lab Results  Component Value Date   TRIG 55.0 09/18/2013   Lab Results  Component Value Date   CHOLHDL 2 09/18/2013     Assessment & Plan  Preventative health care Patient encouraged to maintain heart healthy diet, regular exercise, adequate sleep. Consider daily probiotics. Take medications as prescribed. Colonoscopy was in 2012, next in 2022. Flu shot today. Labs reviewed  Palpitations No recent flares  DEPRESSION Doing well on current meds.  Disorder of phosphorus metabolism Resolved on recheck

## 2013-10-21 ENCOUNTER — Telehealth: Payer: Self-pay | Admitting: Family Medicine

## 2013-10-21 NOTE — Telephone Encounter (Signed)
Caller name: Raphaella  Call back number: [phone]   Reason for call:  Daughter is in advanced healthcare in [facility] and mom is wanting to know if we do any types of internships or anything like that.

## 2013-10-21 NOTE — Telephone Encounter (Signed)
I do not know what I am allowed to do at this time. [facility] has policies regarding this. I will forward to administration to confirm what is possible but I do not have the authority to approve without consent from system.

## 2013-10-22 NOTE — Telephone Encounter (Signed)
Please advise 

## 2013-12-02 LAB — HM MAMMOGRAPHY: HM Mammogram: NEGATIVE

## 2013-12-02 LAB — HM DEXA SCAN

## 2013-12-04 ENCOUNTER — Encounter: Payer: Self-pay | Admitting: Family Medicine

## 2013-12-11 ENCOUNTER — Encounter: Payer: Self-pay | Admitting: *Deleted

## 2013-12-23 ENCOUNTER — Other Ambulatory Visit: Payer: Self-pay | Admitting: Family Medicine

## 2014-01-04 ENCOUNTER — Telehealth: Payer: Self-pay | Admitting: *Deleted

## 2014-01-04 NOTE — Telephone Encounter (Signed)
Called and spoke with the pt and informed her of recent Bone Density Scan results.  Per Dr. Charlett Blake the scan showed Osteopenia-Take Citracal (calcium citrate /Vit D) twice daily.  Maintain exercise and check Vit D level when pt able.   Pt verbalized understanding and stated that she will waiting until her next appt with Dr. Charlett Blake to check the Vit D level.//AB/CMA

## 2014-02-07 ENCOUNTER — Other Ambulatory Visit: Payer: Self-pay | Admitting: Family Medicine

## 2014-02-08 ENCOUNTER — Ambulatory Visit (INDEPENDENT_AMBULATORY_CARE_PROVIDER_SITE_OTHER): Payer: BLUE CROSS/BLUE SHIELD | Admitting: Family Medicine

## 2014-02-08 ENCOUNTER — Encounter: Payer: Self-pay | Admitting: Family Medicine

## 2014-02-08 VITALS — BP 124/82 | HR 80 | Temp 98.2°F | Resp 18 | Ht 62.0 in | Wt 129.0 lb

## 2014-02-08 DIAGNOSIS — J018 Other acute sinusitis: Secondary | ICD-10-CM

## 2014-02-08 MED ORDER — AMOXICILLIN-POT CLAVULANATE 875-125 MG PO TABS: 1.0000 | ORAL_TABLET | Freq: Two times a day (BID) | ORAL | Status: DC

## 2014-02-08 MED ORDER — PREDNISONE 20 MG PO TABS: ORAL_TABLET | ORAL | Status: DC

## 2014-02-08 NOTE — Telephone Encounter (Signed)
Last filled:  09/08/13

## 2014-02-08 NOTE — Progress Notes (Signed)
OFFICE VISIT  02/08/2014   CC:  Chief Complaint  Patient presents with  . Nasal Congestion    several weeks    HPI:    Patient is a 59 y.o. Caucasian female who presents for "wicked sinus symptoms".   Has been around a lot of sheet rock dust b/c of remodeling her home. Congestion in nose, some PND and mild ST, peri-orbital HA's, no fever. Only occasional cough.  Some sneezing and itchy/runny eyes.   Sx's present 2-3 weeks minimum, not improving. Has flonase but doesn't use it much b/c it doesn't help.  Takes mucinex and this helps thin secretions.  Past Medical History  Diagnosis Date  . Allergy     rhinitis  . Anemia     iron deficeincy  . Depression   . Lumbar compression fracture     nonsurgical  . SHOULDER PAIN, BILATERAL 10/10/2007  . PERIMENOPAUSAL STATUS 03/16/2010  . OSTEOARTHRITIS 10/24/2006  . Leiomyoma of uterus, unspecified 02/15/2010  . FRACTURE, NOSE 10/22/2007  . DEPRESSION 10/24/2006  . BASAL CELL CARCINOMA SKIN LOWER LIMB INCL HIP 02/15/2010  . ANEMIA-NOS 10/24/2006  . ALLERGIC RHINITIS 08/15/2006  . Dyspepsia 04/19/2010  . Acute sinusitis 07/31/2006  . BCC (basal cell carcinoma of skin) 07/10/2011    h/o  . 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 Insomnia 01/23/2012  . Preventative health care 08/31/2012    Past Surgical History  Procedure Laterality Date  . Septoplasty    . Gastric bypass    . Tubal ligation    . Nasal sinus surgery    . Cesarean section      X 3  . Appendectomy    . Cholecystectomy    . Ventral herniorrhaphy      X 2  . Abdominal hysterectomy  2009    partial, ovaries left in place, for painful, heavy menstrrual bleeding secondary to anemia and fibroids  . Skin biopsy      L hip BCC, facial lesions were benign    Outpatient Prescriptions Prior to Visit  Medication Sig Dispense Refill  . Biotin 10 MG CAPS Take 1 tablet by mouth daily.    Marland Kitchen L-LYSINE PO Take by mouth.    . Probiotic Product (PROBIOTIC DAILY PO) Take 1  capsule by mouth daily.    . sertraline (ZOLOFT) 100 MG tablet TAKE 1.5 TABLETS (150 MG TOTAL) BY MOUTH DAILY. 90 tablet 1  . vitamin B-12 (CYANOCOBALAMIN) 1000 MCG tablet Take 1,000 mcg by mouth daily.      Marland Kitchen zolpidem (AMBIEN) 10 MG tablet TAKE 1 TABLET EVERY NIGHT AS NEEDED FOR SLEEP 30 tablet 2  . fluticasone (FLONASE) 50 MCG/ACT nasal spray 1-2 sprays each nostril once daily for allergies (Patient not taking: Reported on 02/08/2014) 16 g 3  . cetirizine (ZYRTEC) 10 MG tablet Take 10 mg by mouth daily.     Facility-Administered Medications Prior to Visit  Medication Dose Route Frequency Provider Last Rate Last Dose  . 0.9 %  sodium chloride infusion  500 mL Intravenous Continuous Sable Feil, MD        Allergies  Allergen Reactions  . Itraconazole     REACTION: Rash  . Levaquin [Levofloxacin In D5w]     Joint pain    ROS As per HPI  PE: Blood pressure 124/82, pulse 80, temperature 98.2 F (36.8 C), temperature source Temporal, resp. rate 18, height 5\' 2"  (1.575 m), weight 129 lb (58.514 kg), SpO2 98 %. VS: noted--normal. Gen: alert, NAD, NONTOXIC  APPEARING. HEENT: eyes without injection, drainage, or swelling.  Ears: EACs clear, TMs with normal light reflex and landmarks.  Nose: Clear rhinorrhea, with some dried, crusty exudate adherent to mildly injected mucosa.  No purulent d/c.  Mild diffuse paranasal sinus TTP, R>L.  No facial swelling.  Throat and mouth without focal lesion.  No pharyngial swelling, erythema, or exudate.   Neck: supple, no LAD.   LUNGS: CTA bilat, nonlabored resps.   CV: RRR, no m/r/g. EXT: no c/c/e SKIN: no rash  LABS:  none  IMPRESSION AND PLAN:  Acute sinusitis, allergic + infectious. Pred 40mg  qd x 5d. Augmentin 875 bid x 14d. Continue mucinex, add saline nasal irrigation.  An After Visit Summary was printed and given to the patient.   FOLLOW UP: Return if symptoms worsen or fail to improve.

## 2014-02-08 NOTE — Progress Notes (Signed)
Pre visit review using our clinic review tool, if applicable. No additional management support is needed unless otherwise documented below in the visit note. 

## 2014-02-14 ENCOUNTER — Other Ambulatory Visit: Payer: Self-pay | Admitting: Family Medicine

## 2014-02-15 ENCOUNTER — Ambulatory Visit: Payer: BLUE CROSS/BLUE SHIELD | Admitting: Family Medicine

## 2014-02-15 ENCOUNTER — Ambulatory Visit (INDEPENDENT_AMBULATORY_CARE_PROVIDER_SITE_OTHER): Payer: BLUE CROSS/BLUE SHIELD | Admitting: Family Medicine

## 2014-02-15 ENCOUNTER — Encounter: Payer: Self-pay | Admitting: Family Medicine

## 2014-02-15 VITALS — BP 108/72 | HR 92 | Temp 98.2°F | Ht 62.0 in | Wt 132.0 lb

## 2014-02-15 DIAGNOSIS — Z780 Asymptomatic menopausal state: Secondary | ICD-10-CM

## 2014-02-15 DIAGNOSIS — D649 Anemia, unspecified: Secondary | ICD-10-CM

## 2014-02-15 DIAGNOSIS — T7840XD Allergy, unspecified, subsequent encounter: Secondary | ICD-10-CM

## 2014-02-15 DIAGNOSIS — L659 Nonscarring hair loss, unspecified: Secondary | ICD-10-CM

## 2014-02-15 DIAGNOSIS — G4700 Insomnia, unspecified: Secondary | ICD-10-CM

## 2014-02-15 DIAGNOSIS — J019 Acute sinusitis, unspecified: Secondary | ICD-10-CM

## 2014-02-15 DIAGNOSIS — J309 Allergic rhinitis, unspecified: Secondary | ICD-10-CM

## 2014-02-15 DIAGNOSIS — J329 Chronic sinusitis, unspecified: Secondary | ICD-10-CM

## 2014-02-15 MED ORDER — CONJ ESTROG-MEDROXYPROGEST ACE 0.3-1.5 MG PO TABS: 1.0000 | ORAL_TABLET | Freq: Every day | ORAL | Status: DC

## 2014-02-15 MED ORDER — CETIRIZINE HCL 10 MG PO TABS: 10.0000 mg | ORAL_TABLET | Freq: Every day | ORAL | Status: DC

## 2014-02-15 MED ORDER — MONTELUKAST SODIUM 10 MG PO TABS: 10.0000 mg | ORAL_TABLET | Freq: Every day | ORAL | Status: DC | PRN

## 2014-02-15 MED ORDER — PREDNISONE 20 MG PO TABS: ORAL_TABLET | ORAL | Status: DC

## 2014-02-15 NOTE — Assessment & Plan Note (Signed)
Struggling with fatigue, hot flashes, hair loss, dry skin and lability. She believes it is related to hormonal changes. She is aware of the risks of hormone replacement but would like to try. Is started on the lowest dose of Prempro and reassess at next visit.

## 2014-02-15 NOTE — Progress Notes (Signed)
Jennifer Sandoval  [id number] May 22, 1955 02/15/2014      Progress Note-Follow Up  Subjective  Chief Complaint  Chief Complaint  Patient presents with  . Sinusitis  . Menopause    HPI  Patient is a 59 y.o. female in today for routine medical care. She is in today with numerous concerns. She is being treated for sinusitis with Augmentin and steroids. Did feel better initially on both but now her symptoms are worsening again. She has headache, congestion, malaise and irritated throat. She's also concerned about hair loss, dry skin, irritability and does believe these are related to menopause. Denies CP/palp/HA/congestion/fevers/GI or GU c/o. Taking meds as prescribed  Past Medical History  Diagnosis Date  . Allergy     rhinitis  . Anemia     iron deficeincy  . Depression   . Lumbar compression fracture     nonsurgical  . SHOULDER PAIN, BILATERAL 10/10/2007  . PERIMENOPAUSAL STATUS 03/16/2010  . OSTEOARTHRITIS 10/24/2006  . Leiomyoma of uterus, unspecified 02/15/2010  . FRACTURE, NOSE 10/22/2007  . DEPRESSION 10/24/2006  . BASAL CELL CARCINOMA SKIN LOWER LIMB INCL HIP 02/15/2010  . ANEMIA-NOS 10/24/2006  . ALLERGIC RHINITIS 08/15/2006  . Dyspepsia 04/19/2010  . Acute sinusitis 07/31/2006  . BCC (basal cell carcinoma of skin) 07/10/2011    h/o  . 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 Insomnia 01/23/2012  . Preventative health care 08/31/2012  . Post-menopause 03/16/2010    Qualifier: Diagnosis of  By: Charlett Blake MD, Erline Levine      Past Surgical History  Procedure Laterality Date  . Septoplasty    . Gastric bypass    . Tubal ligation    . Nasal sinus surgery    . Cesarean section      X 3  . Appendectomy    . Cholecystectomy    . Ventral herniorrhaphy      X 2  . Abdominal hysterectomy  2009    partial, ovaries left in place, for painful, heavy menstrrual bleeding secondary to anemia and fibroids  . Skin biopsy      L hip BCC, facial lesions were benign    Family  History  Problem Relation Age of Onset  . Cholelithiasis Mother   . Other Mother     Urinary incontince/ bladder prolapse/ h/o smoking  . Cancer Mother     laryngeal carcinoma  . Allergies Mother   . Arthritis Mother     s/p knee and shoulder replacement  . Dementia Mother     lewy body  . Other Father     Renal failure  . Hypertension Father   . Coronary artery disease Father     s/p bypass  . Aortic aneurysm Father   . Hyperlipidemia Father   . Heart disease Father 14    quadruple bypass  . Spina bifida Brother     with stunt/ self cath  . Seizures Brother     disorders  . ADD / ADHD Daughter   . Cancer Maternal Grandmother     colon/ smoker  . Cancer Maternal Grandfather     lung  . Cancer Paternal Grandmother     Breast  . Heart disease Paternal Grandfather   . Stroke Paternal Grandfather   . ADD / ADHD Daughter     History   Social History  . Marital Status: Married    Spouse Name: N/A  . Number of Children: N/A  . Years of Education: N/A   Occupational History  .  Not on file.   Social History Main Topics  . Smoking status: Never Smoker   . Smokeless tobacco: Never Used  . Alcohol Use: Yes     Comment: special occasion  . Drug Use: No  . Sexual Activity:    Partners: Male   Other Topics Concern  . Not on file   Social History Narrative    Current Outpatient Prescriptions on File Prior to Visit  Medication Sig Dispense Refill  . amoxicillin-clavulanate (AUGMENTIN) 875-125 MG per tablet Take 1 tablet by mouth 2 (two) times daily. 28 tablet 0  . Biotin 10 MG CAPS Take 1 tablet by mouth daily.    . fluticasone (FLONASE) 50 MCG/ACT nasal spray 1-2 sprays each nostril once daily for allergies 16 g 3  . L-LYSINE PO Take by mouth.    . Probiotic Product (PROBIOTIC DAILY PO) Take 1 capsule by mouth daily.    . sertraline (ZOLOFT) 100 MG tablet TAKE 1.5 TABLETS (150 MG TOTAL) BY MOUTH DAILY. 90 tablet 1  . vitamin B-12 (CYANOCOBALAMIN) 1000 MCG tablet  Take 1,000 mcg by mouth daily.       Current Facility-Administered Medications on File Prior to Visit  Medication Dose Route Frequency Provider Last Rate Last Dose  . 0.9 %  sodium chloride infusion  500 mL Intravenous Continuous Sable Feil, MD        Allergies  Allergen Reactions  . Itraconazole     REACTION: Rash  . Levaquin [Levofloxacin In D5w]     Joint pain    Review of Systems  Review of Systems  Constitutional: Negative for fever and malaise/fatigue.  HENT: Positive for congestion.   Eyes: Negative for discharge.  Respiratory: Negative for shortness of breath.   Cardiovascular: Negative for chest pain, palpitations and leg swelling.  Gastrointestinal: Negative for nausea, abdominal pain and diarrhea.  Genitourinary: Negative for dysuria.  Musculoskeletal: Negative for falls.  Skin: Negative for rash.  Neurological: Positive for headaches. Negative for loss of consciousness.  Endo/Heme/Allergies: Negative for polydipsia.  Psychiatric/Behavioral: Negative for depression and suicidal ideas. The patient is nervous/anxious. The patient does not have insomnia.     Objective  BP 108/72 mmHg  Pulse 92  Temp(Src) 98.2 F (36.8 C) (Oral)  Ht 5\' 2"  (1.575 m)  Wt 132 lb (59.875 kg)  BMI 24.14 kg/m2  SpO2 94%  Physical Exam  Physical Exam  Constitutional: She is oriented to person, place, and time and well-developed, well-nourished, and in no distress. No distress.  HENT:  Head: Normocephalic and atraumatic.  Eyes: Conjunctivae are normal.  Neck: Neck supple. No thyromegaly present.  Cardiovascular: Normal rate, regular rhythm and normal heart sounds.   No murmur heard. Pulmonary/Chest: Effort normal and breath sounds normal. She has no wheezes.  Abdominal: She exhibits no distension and no mass.  Musculoskeletal: She exhibits no edema.  Lymphadenopathy:    She has no cervical adenopathy.  Neurological: She is alert and oriented to person, place, and time.    Skin: Skin is warm and dry. No rash noted. She is not diaphoretic.  Psychiatric: Memory, affect and judgment normal.    Lab Results  Component Value Date   TSH 1.33 09/18/2013   Lab Results  Component Value Date   WBC 5.5 09/18/2013   HGB 11.8* 09/18/2013   HCT 36.3 09/18/2013   MCV 83.1 09/18/2013   PLT 265.0 09/18/2013   Lab Results  Component Value Date   CREATININE 0.9 09/18/2013   BUN 16 09/18/2013  NA 139 09/18/2013   K 3.9 09/18/2013   CL 106 09/18/2013   CO2 27 09/18/2013   Lab Results  Component Value Date   ALT 17 09/18/2013   AST 31 09/18/2013   ALKPHOS 83 09/18/2013   BILITOT 0.5 09/18/2013   Lab Results  Component Value Date   CHOL 140 09/18/2013   Lab Results  Component Value Date   HDL 67.30 09/18/2013   Lab Results  Component Value Date   LDLCALC 62 09/18/2013   Lab Results  Component Value Date   TRIG 55.0 09/18/2013   Lab Results  Component Value Date   CHOLHDL 2 09/18/2013     Assessment & Plan  Post-menopause Struggling with fatigue, hot flashes, hair loss, dry skin and lability. She believes it is related to hormonal changes. She is aware of the risks of hormone replacement but would like to try. Is started on the lowest dose of Prempro and reassess at next visit.   Allergic rhinitis They are renovating their home and her allergies are flaring and contributing to sinusitis. She is encouraged to use Singulair (rx given), Cetirizine, and flonase daily. Nasal saline tid and mucinex bid report if no improvement   Acute sinusitis Recurrent improving some on Augmentin finish course and let us know if symptoms recur. Was somewhat better on Prednisone

## 2014-02-15 NOTE — Progress Notes (Signed)
Pre visit review using our clinic review tool, if applicable. No additional management support is needed unless otherwise documented below in the visit note. 

## 2014-02-15 NOTE — Patient Instructions (Signed)
Alopecia Areata Alopecia areata is a self-destructing (autoimmune) disease that results in the loss of hair. In this condition your body's immune system attacks the hair follicle. The hair follicle is responsible for growing hair. Hair loss can occur on the scalp and other parts of the body. It usually starts as one or more small, round, smooth patches of hair loss. It occurs in males and females of all ages and races, but usually starts before age 58. The scalp is the most commonly affected area, but the beard or any hair-bearing site can be affected. This type of hair loss does not leave scars where the hair was lost.  Many people with alopecia areata only have a few patches of hair loss. In others, extensive patchy hair loss occurs. In a few people, all scalp hair is lost (alopecia totalis), or hair is lost from the entire scalp and body (alopecia universalis). No matter how widespread the hair loss, the hair follicles remain alive and are ready to resume normal hair production whenever they receive the correct signal. Hair re-growth may occur without treatment and can even restart after years of hair loss.  CAUSES  It is thought that something triggers the immune system to stop hair growth. It is not always known what the cause is. Some people have genetic markers that can increase the chance of developing alopecia areata. Alopecia areata often occurs in families whose members have had:  Asthma.  Hay fever.  Atopic eczema.  Some autoimmune diseases may also be a trigger, such as:  Thyroid disease.  Diabetes.  Rheumatoid arthritis.  Lupus erythematosus.  Vitiligo.  Pernicious anemia.  Addison's disease. OTHER SYMPTOMS In some people, the nail beds may develop rows of tiny dents (stippling) or the nail beds can become distorted. Other than the hair and nail beds, no other body part is affected.  PROGNOSIS  Alopecia areata is not medically disabling. People with alopecia areata are  usually in excellent health. Hair loss can be emotionally difficult. The National Alopecia Areata Foundation has resources available to help individuals and families with alopecia areata. Their goal is to help people with the condition live full, productive lives. There are many successful, well-adjusted, contented people living with Alopecia areata. Alopecia areata can be overcome with:  A positive self image.  Sound medical facts.  The support of others, such as:  Sometimes professional counseling is helpful to develop one's self-confidence and positive self-image. TREATMENT  There is no cure for alopecia areata. There are several available treatments. Treatments are most effective in milder cases. No treatment is effective for everyone. Choice of treatment depends mainly on a person's age and the extent of their hair loss. Alopecia areata occurs in two forms:   A mild patchy form where less than 50 percent of scalp hair is lost.  An extensive form where greater than 50 percent of scalp hair is lost. These two forms of alopecia areata behave quite differently, and the choice of treatment depends on which form is present. Current treatments do not turn alopecia areata off. They can stimulate the hair follicle to produce hair.  Some medications used to treat mild cases include:  Cortisone injections. The most common treatment is the injection of cortisone into the bare skin patches. The injections are usually given by a caregiver specializing in skin issues (dermatologist). This caregiver will use a tiny needle to give multiple injections into the skin in and around the bare patches. The injections are repeated once a month.   If new hair growth occurs, it is usually visible within 4 weeks. Treatment does not prevent new patches of hair loss from developing. There are few side effects from local cortisone injections. Occasionally, temporary dents (depressions) in the skin result from the local  injections, but these dents can fill in by themselves.  Topical minoxidil. Five percent topical minoxidil solution applied twice daily may grow hair in alopecia areata. Scalp, eyebrows, and beard hair may respond. If scalp hair re-grows completely, treatment can be stopped. Response may improve if topical cortisone cream is applied 30 minutes after the minoxidil. Topical minoxidil is safe, easy to use, and does not lower blood pressure in persons with normal blood pressure. Minoxidil can lead to unwanted facial hair growth in some people.  Anthralin cream or ointment. Another treatment is the application of anthralin cream or ointment. Anthralin is a tar-like substance that has been used widely for psoriasis. Anthralin is applied to the bare patches once daily. It is washed off after a short time, usually 30 to 60 minutes later. If new hair growth occurs, it is seen in 8 to 12 weeks. Anthralin can be irritating to the skin. It can cause temporary, brownish discoloration of the treated skin. By using short treatment times, skin irritation and skin staining are reduced without decreasing effectiveness. Care must be taken not to get anthralin in the eyes. Some of the medications used for more extensive cases where there is greater than 50% hair loss include:  Cortisone pills. Cortisone pills are sometimes given for extensive scalp hair loss. Cortisone taken internally is much stronger than local injections of cortisone into the skin. It is necessary to discuss possible side effects of cortisone pills with your caregiver. In general, however, cortisone pills are used in relatively few patients with alopecia areata due to health risks from prolonged use. Also, hair that has grown is likely to fall out when the cortisone pills are stopped.  Topical minoxidil. See previous explanation under mild, patchy alopecia areata. However, minoxidil is not effective in total loss of scalp hair (alopecia totalis).  Topical  immunotherapy. Another method of treating alopecia areata or alopecia totalis/universalis involves producing an allergic rash or allergic contact dermatitis. Chemicals such as diphencyprone (DPCP) or squaric acid dibutyl ester (SADBE) are applied to the scalp to produce an allergic rash which resembles poison oak or ivy. Approximately 40% of patients treated with topical immunotherapy will re-grow scalp hair after about 6 months of treatment. Those who do successfully re-grow scalp hair will need to continue treatment to maintain hair re-growth.  Wigs. For extensive hair loss, a wig can be an important option for some people. Proper attention will make a quality wig look completely natural. A wig will need to be cut, thinned, and styled. To keep a net base wig from falling off, special double-sided tape can be purchased in beauty supply outlets and fastened to the inside of the wig.  For those with completely bare heads, there are suction caps to which any wig can be attached. There are also entire suction cap wig units. FOR MORE INFORMATION National Alopecia Areata Foundation: https://www.berry.org/ Document Released: 07/23/2003 Document Revised: 03/12/2011 Document Reviewed: 03/09/2013 [facility] Patient Information 2015 [place], [place]. This information is not intended to replace advice given to you by your health care provider. Make sure you discuss any questions you have with your health care provider.

## 2014-02-15 NOTE — Assessment & Plan Note (Signed)
They are renovating their home and her allergies are flaring and contributing to sinusitis. She is encouraged to use Singulair (rx given), Cetirizine, and flonase daily. Nasal saline tid and mucinex bid report if no improvement

## 2014-02-15 NOTE — Assessment & Plan Note (Signed)
Recurrent improving some on Augmentin finish course and let us know if symptoms recur. Was somewhat better on Prednisone

## 2014-02-16 ENCOUNTER — Telehealth: Payer: Self-pay | Admitting: Family Medicine

## 2014-02-16 LAB — CBC
HEMATOCRIT: 40.7 % (ref 36.0–46.0)
HEMOGLOBIN: 13 g/dL (ref 12.0–15.0)
MCH: 29 pg (ref 26.0–34.0)
MCHC: 31.9 g/dL (ref 30.0–36.0)
MCV: 90.6 fL (ref 78.0–100.0)
MPV: 10.3 fL (ref 8.6–12.4)
Platelets: 285 10*3/uL (ref 150–400)
RBC: 4.49 MIL/uL (ref 3.87–5.11)
RDW: 16.5 % — ABNORMAL HIGH (ref 11.5–15.5)
WBC: 8 10*3/uL (ref 4.0–10.5)

## 2014-02-16 LAB — COMPREHENSIVE METABOLIC PANEL
ALK PHOS: 86 U/L (ref 39–117)
ALT: 17 U/L (ref 0–35)
AST: 24 U/L (ref 0–37)
Albumin: 4 g/dL (ref 3.5–5.2)
BUN: 14 mg/dL (ref 6–23)
CO2: 29 mEq/L (ref 19–32)
Calcium: 9.4 mg/dL (ref 8.4–10.5)
Chloride: 102 mEq/L (ref 96–112)
Creat: 0.71 mg/dL (ref 0.50–1.10)
Glucose, Bld: 84 mg/dL (ref 70–99)
Potassium: 4.2 mEq/L (ref 3.5–5.3)
SODIUM: 140 meq/L (ref 135–145)
Total Bilirubin: 0.4 mg/dL (ref 0.2–1.2)
Total Protein: 6.4 g/dL (ref 6.0–8.3)

## 2014-02-16 LAB — TSH: TSH: 0.964 u[IU]/mL (ref 0.350–4.500)

## 2014-02-16 MED ORDER — ESTROGENS CONJUGATED 0.3 MG PO TABS: 0.3000 mg | ORAL_TABLET | Freq: Every day | ORAL | Status: DC

## 2014-02-16 NOTE — Telephone Encounter (Signed)
Patient has been informed of change and informed of PCP instuctions.  Sent in new prescription to pharmacy.

## 2014-02-16 NOTE — Telephone Encounter (Signed)
Patient was prescribed Prempro yesterday  02/15/14.  She has read pamphlet with script and it states not to take if had a hysterectomy may not need the progesterone. This patient has had a hysterectomy. Please advise if she should take or be prescribed something else

## 2014-02-16 NOTE — Telephone Encounter (Signed)
So I did not check her surgical status I was so sure she had not had a hysterectomy. Send her in a prescription for Premarin 0.3 mg daily, # 30 with 3 rf. Tell her I will call her later if she has any questions but she is correct.

## 2014-03-29 ENCOUNTER — Ambulatory Visit: Payer: BC Managed Care – PPO | Admitting: Family Medicine

## 2014-04-05 ENCOUNTER — Ambulatory Visit: Payer: BC Managed Care – PPO | Admitting: Family Medicine

## 2014-05-04 ENCOUNTER — Ambulatory Visit: Payer: BLUE CROSS/BLUE SHIELD | Admitting: Family Medicine

## 2014-05-06 ENCOUNTER — Telehealth: Payer: Self-pay | Admitting: Family Medicine

## 2014-05-06 ENCOUNTER — Encounter: Payer: Self-pay | Admitting: Family Medicine

## 2014-05-06 NOTE — Telephone Encounter (Signed)
Pt was no show for appt on 05/04/14- letter sent- charge?

## 2014-05-06 NOTE — Telephone Encounter (Signed)
No charge. She had to leave.

## 2014-06-11 ENCOUNTER — Other Ambulatory Visit: Payer: Self-pay | Admitting: Family Medicine

## 2014-06-22 ENCOUNTER — Telehealth: Payer: Self-pay | Admitting: Family Medicine

## 2014-06-22 MED ORDER — CEFDINIR 300 MG PO CAPS: 300.0000 mg | ORAL_CAPSULE | Freq: Two times a day (BID) | ORAL | Status: DC

## 2014-06-22 NOTE — Telephone Encounter (Signed)
Caller name: Doneshia Relation to pt: Call back number: [phone] [facility]  Reason for call:   Patient states that she has another sinus infection and is requesting something be called in.

## 2014-06-22 NOTE — Telephone Encounter (Signed)
Need to know what her symptoms are but she does get roughly one to 2 a year. Can send in Cefdinir 300 mg caps 1 cap po bid x 14 days

## 2014-06-22 NOTE — Telephone Encounter (Signed)
Sent in prescription as instructed.  The patient stated she is having headaches and a lot of congestion.

## 2014-07-03 ENCOUNTER — Other Ambulatory Visit: Payer: Self-pay | Admitting: Family Medicine

## 2014-09-29 ENCOUNTER — Encounter: Payer: Self-pay | Admitting: Physician Assistant

## 2014-09-29 ENCOUNTER — Ambulatory Visit (HOSPITAL_BASED_OUTPATIENT_CLINIC_OR_DEPARTMENT_OTHER)
Admission: RE | Admit: 2014-09-29 | Discharge: 2014-09-29 | Disposition: A | Discharge status: Home / Self Care | Payer: Managed Care, Other (non HMO) | Source: Ambulatory Visit | Attending: Physician Assistant | Admitting: Physician Assistant

## 2014-09-29 ENCOUNTER — Ambulatory Visit (INDEPENDENT_AMBULATORY_CARE_PROVIDER_SITE_OTHER): Payer: Managed Care, Other (non HMO) | Admitting: Physician Assistant

## 2014-09-29 ENCOUNTER — Telehealth: Payer: Self-pay | Admitting: Family Medicine

## 2014-09-29 VITALS — BP 102/68 | HR 95 | Temp 99.6°F | Resp 16 | Ht 62.0 in | Wt 130.5 lb

## 2014-09-29 DIAGNOSIS — R05 Cough: Secondary | ICD-10-CM | POA: Insufficient documentation

## 2014-09-29 DIAGNOSIS — B9689 Other specified bacterial agents as the cause of diseases classified elsewhere: Secondary | ICD-10-CM | POA: Insufficient documentation

## 2014-09-29 DIAGNOSIS — R918 Other nonspecific abnormal finding of lung field: Secondary | ICD-10-CM | POA: Insufficient documentation

## 2014-09-29 DIAGNOSIS — R079 Chest pain, unspecified: Secondary | ICD-10-CM | POA: Diagnosis not present

## 2014-09-29 DIAGNOSIS — J019 Acute sinusitis, unspecified: Secondary | ICD-10-CM | POA: Diagnosis not present

## 2014-09-29 DIAGNOSIS — J189 Pneumonia, unspecified organism: Secondary | ICD-10-CM | POA: Diagnosis not present

## 2014-09-29 DIAGNOSIS — R0989 Other specified symptoms and signs involving the circulatory and respiratory systems: Secondary | ICD-10-CM | POA: Diagnosis not present

## 2014-09-29 MED ORDER — BENZONATATE 200 MG PO CAPS: 200.0000 mg | ORAL_CAPSULE | Freq: Two times a day (BID) | ORAL | Status: DC | PRN

## 2014-09-29 MED ORDER — DOXYCYCLINE HYCLATE 100 MG PO CAPS: 100.0000 mg | ORAL_CAPSULE | Freq: Two times a day (BID) | ORAL | Status: DC

## 2014-09-29 NOTE — Progress Notes (Signed)
Pre visit review using our clinic review tool, if applicable. No additional management support is needed unless otherwise documented below in the visit note/SLS  

## 2014-09-29 NOTE — Progress Notes (Signed)
History of Present Illness: Jennifer Sandoval is a 59 y.o. female who present to the clinic today complaining of sinus pressure, sinus pain and nasal congestion x 1.5 weeks. Patient endorses headaches, chills, intermittent fevers, sweats, L ear pain.  Patient denies chest pain, SOB or chest congestion but notes non-productive cough.  History: Past Medical History  Diagnosis Date  . Allergy     rhinitis  . Anemia     iron deficeincy  . Depression   . Lumbar compression fracture     nonsurgical  . SHOULDER PAIN, BILATERAL 10/10/2007  . PERIMENOPAUSAL STATUS 03/16/2010  . OSTEOARTHRITIS 10/24/2006  . Leiomyoma of uterus, unspecified 02/15/2010  . FRACTURE, NOSE 10/22/2007  . DEPRESSION 10/24/2006  . BASAL CELL CARCINOMA SKIN LOWER LIMB INCL HIP 02/15/2010  . ANEMIA-NOS 10/24/2006  . ALLERGIC RHINITIS 08/15/2006  . Dyspepsia 04/19/2010  . Acute sinusitis 07/31/2006  . BCC (basal cell carcinoma of skin) 07/10/2011    h/o  . 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 Insomnia 01/23/2012  . Preventative health care 08/31/2012  . Post-menopause 03/16/2010    Qualifier: Diagnosis of  By: Charlett Blake MD, Erline Levine      Current outpatient prescriptions:  .  Biotin 10 MG CAPS, Take 1 tablet by mouth daily., Disp: , Rfl:  .  fluticasone (FLONASE) 50 MCG/ACT nasal spray, 1-2 sprays each nostril once daily for allergies (Patient taking differently: daily as needed. 1-2 sprays each nostril once daily for allergies), Disp: 16 g, Rfl: 3 .  L-LYSINE PO, Take by mouth., Disp: , Rfl:  .  Probiotic Product (PROBIOTIC DAILY PO), Take 1 capsule by mouth daily., Disp: , Rfl:  .  sertraline (ZOLOFT) 100 MG tablet, TAKE 1.5 TABLETS (150 MG TOTAL) BY MOUTH DAILY. (Patient taking differently: TAKE 1 TABLETS (100 MG TOTAL) BY MOUTH DAILY.), Disp: 45 tablet, Rfl: 3 .  vitamin B-12 (CYANOCOBALAMIN) 1000 MCG tablet, Take 1,000 mcg by mouth daily.  , Disp: , Rfl:  .  benzonatate (TESSALON) 200 MG capsule, Take 1 capsule (200  mg total) by mouth 2 (two) times daily as needed for cough., Disp: 20 capsule, Rfl: 0 .  doxycycline (VIBRAMYCIN) 100 MG capsule, Take 1 capsule (100 mg total) by mouth 2 (two) times daily., Disp: 20 capsule, Rfl: 0  Current facility-administered medications:  .  0.9 %  sodium chloride infusion, 500 mL, Intravenous, Continuous, Sable Feil, MD Allergies  Allergen Reactions  . Itraconazole     REACTION: Rash  . Levaquin [Levofloxacin In D5w]     Joint pain   Family History  Problem Relation Age of Onset  . Cholelithiasis Mother   . Other Mother     Urinary incontince/ bladder prolapse/ h/o smoking  . Cancer Mother     laryngeal carcinoma  . Allergies Mother   . Arthritis Mother     s/p knee and shoulder replacement  . Dementia Mother     lewy body  . Other Father     Renal failure  . Hypertension Father   . Coronary artery disease Father     s/p bypass  . Aortic aneurysm Father   . Hyperlipidemia Father   . Heart disease Father 1    quadruple bypass  . Spina bifida Brother     with stunt/ self cath  . Seizures Brother     disorders  . ADD / ADHD Daughter   . Cancer Maternal Grandmother     colon/ smoker  . Cancer Maternal Grandfather  lung  . Cancer Paternal Grandmother     Breast  . Heart disease Paternal Grandfather   . Stroke Paternal Grandfather   . ADD / ADHD Daughter    Social History   Social History  . Marital Status: Married    Spouse Name: N/A  . Number of Children: N/A  . Years of Education: N/A   Social History Main Topics  . Smoking status: Never Smoker   . Smokeless tobacco: Never Used  . Alcohol Use: Yes     Comment: special occasion  . Drug Use: No  . Sexual Activity:    Partners: Male   Other Topics Concern  . None   Social History Narrative    Review of Systems: See HPI.  All other ROS are negative.  Physical Examination: BP 102/68 mmHg  Pulse 95  Temp(Src) 99.6 F (37.6 C) (Oral)  Resp 16  Ht 5\' 2"  (1.575 m)   Wt 130 lb 8 oz (59.194 kg)  BMI 23.86 kg/m2  SpO2 97%  General appearance: alert, cooperative and appears stated age Head: Normocephalic, without obvious abnormality, atraumatic, sinuses tender to percussion Eyes: conjunctivae/corneas clear. PERRL, EOM's intact. Fundi benign. Ears: normal TM's and external ear canals both ears Nose: moderate congestion, turbinates swollen, sinus tenderness left Throat: lips, mucosa, and tongue normal; teeth and gums normal Neck: no adenopathy, no carotid bruit, no JVD, supple, symmetrical, trachea midline and thyroid not enlarged, symmetric, no tenderness/mass/nodules Lungs: Crackles R lung base Chest wall: no tenderness  Assessment/Plan: CAP (community acquired pneumonia) X-ray obtained due to abnormal lung exam. RLL pneumonia noted. Giving CAP and sinusitis along with FQ allergy, will Rx Doxycycline. Supportive measures reviewed with patient. Follow-up Monday or Tuesday.  Acute bacterial sinusitis Rx Doxycline.  Increase fluids.  Rest.  Saline nasal spray.  Probiotic.  Mucinex as directed.  Humidifier in bedroom. Tessalon for cough.

## 2014-09-29 NOTE — Patient Instructions (Signed)
Please go downstairs for imaging. I will call you with results. We will decide on an antibiotic based on x-ray results. Please stay well hydrated and rest. Take Tessalon as directed for cough. Continue Mucinex. Place a humidifier in the bedroom.

## 2014-09-29 NOTE — Assessment & Plan Note (Signed)
Rx Doxycline.  Increase fluids.  Rest.  Saline nasal spray.  Probiotic.  Mucinex as directed.  Humidifier in bedroom. Tessalon for cough.

## 2014-09-29 NOTE — Assessment & Plan Note (Signed)
X-ray obtained due to abnormal lung exam. RLL pneumonia noted. Giving CAP and sinusitis along with FQ allergy, will Rx Doxycycline. Supportive measures reviewed with patient. Follow-up Monday or Tuesday.

## 2014-09-29 NOTE — Telephone Encounter (Signed)
Caller name: Brayleigh Rybacki   Relationship to patient: Self   Can be reached: [phone]  Pharmacy:  Reason for call: pt called in to schedule a fu with provider directly per Elyn Aquas (visit today) He says that he seen a touch of pneumonia in her lungs and want her to come in on Monday or Tuesday, however I'm not showing an appt slot. Please assist?    I will be more than happy to follow up with pt to schedule.   Thanks.

## 2014-09-29 NOTE — Telephone Encounter (Signed)
Okay to put 2-15 minute appts together.  

## 2014-09-30 NOTE — Telephone Encounter (Signed)
How about 4:15 on Monday

## 2014-09-30 NOTE — Telephone Encounter (Signed)
Im not showing an appt slot to schedule pt in at all.Marland Kitchen

## 2014-10-04 ENCOUNTER — Ambulatory Visit (INDEPENDENT_AMBULATORY_CARE_PROVIDER_SITE_OTHER): Payer: Managed Care, Other (non HMO) | Admitting: Family Medicine

## 2014-10-04 ENCOUNTER — Encounter: Payer: Self-pay | Admitting: Family Medicine

## 2014-10-04 VITALS — BP 109/72 | HR 78 | Temp 98.2°F | Ht 62.0 in | Wt 131.0 lb

## 2014-10-04 DIAGNOSIS — R002 Palpitations: Secondary | ICD-10-CM

## 2014-10-04 DIAGNOSIS — J189 Pneumonia, unspecified organism: Secondary | ICD-10-CM | POA: Diagnosis not present

## 2014-10-04 NOTE — Patient Instructions (Signed)
Encouraged increased rest and hydration, add probiotics, zinc such as Coldeze or Xicam. Treat fevers as needed. Elderberry liquid helps fight viruses and soothe the throat. Vitamin C 500 to 1000 mg daily, aged or black garlic when ill  [employer] has a 10 strain probiotic and can get at [email or web address]  Pneumonia Pneumonia is an infection of the lungs.  CAUSES Pneumonia may be caused by bacteria or a virus. Usually, these infections are caused by breathing infectious particles into the lungs (respiratory tract). SIGNS AND SYMPTOMS   Cough.  Fever.  Chest pain.  Increased rate of breathing.  Wheezing.  Mucus production. DIAGNOSIS  If you have the common symptoms of pneumonia, your health care provider will typically confirm the diagnosis with a chest X-ray. The X-ray will show an abnormality in the lung (pulmonary infiltrate) if you have pneumonia. Other tests of your blood, urine, or sputum may be done to find the specific cause of your pneumonia. Your health care provider may also do tests (blood gases or pulse oximetry) to see how well your lungs are working. TREATMENT  Some forms of pneumonia may be spread to other people when you cough or sneeze. You may be asked to wear a mask before and during your exam. Pneumonia that is caused by bacteria is treated with antibiotic medicine. Pneumonia that is caused by the influenza virus may be treated with an antiviral medicine. Most other viral infections must run their course. These infections will not respond to antibiotics.  HOME CARE INSTRUCTIONS   Cough suppressants may be used if you are losing too much rest. However, coughing protects you by clearing your lungs. You should avoid using cough suppressants if you can.  Your health care provider may have prescribed medicine if he or she thinks your pneumonia is caused by bacteria or influenza. Finish your medicine even if you start to feel better.  Your health care provider may also  prescribe an expectorant. This loosens the mucus to be coughed up.  Take medicines only as directed by your health care provider.  Do not smoke. Smoking is a common cause of bronchitis and can contribute to pneumonia. If you are a smoker and continue to smoke, your cough may last several weeks after your pneumonia has cleared.  A cold steam vaporizer or humidifier in your room or home may help loosen mucus.  Coughing is often worse at night. Sleeping in a semi-upright position in a recliner or using a couple pillows under your head will help with this.  Get rest as you feel it is needed. Your body will usually let you know when you need to rest. PREVENTION A pneumococcal shot (vaccine) is available to prevent a common bacterial cause of pneumonia. This is usually suggested for:  People over 27 years old.  Patients on chemotherapy.  People with chronic lung problems, such as bronchitis or emphysema.  People with immune system problems. If you are over 65 or have a high risk condition, you may receive the pneumococcal vaccine if you have not received it before. In some countries, a routine influenza vaccine is also recommended. This vaccine can help prevent some cases of pneumonia.You may be offered the influenza vaccine as part of your care. If you smoke, it is time to quit. You may receive instructions on how to stop smoking. Your health care provider can provide medicines and counseling to help you quit. SEEK MEDICAL CARE IF: You have a fever. SEEK IMMEDIATE MEDICAL CARE IF:   Your  illness becomes worse. This is especially true if you are elderly or weakened from any other disease.  You cannot control your cough with suppressants and are losing sleep.  You begin coughing up blood.  You develop pain which is getting worse or is uncontrolled with medicines.  Any of the symptoms which initially brought you in for treatment are getting worse rather than better.  You develop  shortness of breath or chest pain. MAKE SURE YOU:   Understand these instructions.  Will watch your condition.  Will get help right away if you are not doing well or get worse. Document Released: 12/18/2004 Document Revised: 05/04/2013 Document Reviewed: 03/09/2010 [facility] Patient Information 2015 [place], [place]. This information is not intended to replace advice given to you by your health care provider. Make sure you discuss any questions you have with your health care provider.

## 2014-10-04 NOTE — Progress Notes (Signed)
Pre visit review using our clinic review tool, if applicable. No additional management support is needed unless otherwise documented below in the visit note. 

## 2014-10-04 NOTE — Telephone Encounter (Signed)
Patient scheduled for 10/04/14 at 4:15.

## 2014-10-07 ENCOUNTER — Telehealth: Payer: Self-pay | Admitting: Family Medicine

## 2014-10-07 DIAGNOSIS — B9689 Other specified bacterial agents as the cause of diseases classified elsewhere: Secondary | ICD-10-CM

## 2014-10-07 DIAGNOSIS — J019 Acute sinusitis, unspecified: Principal | ICD-10-CM

## 2014-10-07 DIAGNOSIS — J189 Pneumonia, unspecified organism: Secondary | ICD-10-CM

## 2014-10-07 NOTE — Telephone Encounter (Signed)
OK to send in a refill on the Doxycycline

## 2014-10-07 NOTE — Telephone Encounter (Signed)
Pt called for refill on doxycycline. She said that she still feel pressure in her chest and is afraid the issue will come back. She said she was advised to call for additional antibiotics by Dr. Charlett Blake (if needed). [facility]

## 2014-10-08 MED ORDER — DOXYCYCLINE HYCLATE 100 MG PO CAPS: 100.0000 mg | ORAL_CAPSULE | Freq: Two times a day (BID) | ORAL | Status: DC

## 2014-10-08 NOTE — Telephone Encounter (Signed)
Sent in antibiotic as instructed.  Called the patient informed and left detailed message script sent in.

## 2014-10-10 ENCOUNTER — Encounter: Payer: Self-pay | Admitting: Family Medicine

## 2014-10-10 NOTE — Progress Notes (Signed)
Subjective:    Patient ID: Jennifer Sandoval, female    DOB: 02/13/1955, 59 y.o.   MRN: [id number]  Chief Complaint  Patient presents with  . Follow-up    pneumonia    HPI Patient is in today for follow-up on recent diagnosis of pneumonia. She is feeling somewhat better. Continues to struggle with fatigue but her cough and congestion are improving. She denies fevers, chills, headache. No acute concerns. Is tolerating and antibiotics. Mucinex helps symptoms temporarily. Denies CP/palp/SOB/HA/fevers/GI or GU c/o. Taking meds as prescribed  Past Medical History  Diagnosis Date  . Allergy     rhinitis  . Anemia     iron deficeincy  . Depression   . Lumbar compression fracture (HCC)     nonsurgical  . SHOULDER PAIN, BILATERAL 10/10/2007  . PERIMENOPAUSAL STATUS 03/16/2010  . OSTEOARTHRITIS 10/24/2006  . Leiomyoma of uterus, unspecified 02/15/2010  . FRACTURE, NOSE 10/22/2007  . DEPRESSION 10/24/2006  . BASAL CELL CARCINOMA SKIN LOWER LIMB INCL HIP 02/15/2010  . ANEMIA-NOS 10/24/2006  . ALLERGIC RHINITIS 08/15/2006  . Dyspepsia 04/19/2010  . Acute sinusitis 07/31/2006  . BCC (basal cell carcinoma of skin) 07/10/2011    h/o  . 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 Insomnia 01/23/2012  . Preventative health care 08/31/2012  . Post-menopause 03/16/2010    Qualifier: Diagnosis of  By: Charlett Blake MD, Erline Levine      Past Surgical History  Procedure Laterality Date  . Septoplasty    . Gastric bypass    . Tubal ligation    . Nasal sinus surgery    . Cesarean section      X 3  . Appendectomy    . Cholecystectomy    . Ventral herniorrhaphy      X 2  . Abdominal hysterectomy  2009    partial, ovaries left in place, for painful, heavy menstrrual bleeding secondary to anemia and fibroids  . Skin biopsy      L hip BCC, facial lesions were benign    Family History  Problem Relation Age of Onset  . Cholelithiasis Mother   . Other Mother     Urinary incontince/ bladder prolapse/ h/o  smoking  . Cancer Mother     laryngeal carcinoma  . Allergies Mother   . Arthritis Mother     s/p knee and shoulder replacement  . Dementia Mother     lewy body  . Other Father     Renal failure  . Hypertension Father   . Coronary artery disease Father     s/p bypass  . Aortic aneurysm Father   . Hyperlipidemia Father   . Heart disease Father 53    quadruple bypass  . Spina bifida Brother     with stunt/ self cath  . Seizures Brother     disorders  . ADD / ADHD Daughter   . Cancer Maternal Grandmother     colon/ smoker  . Cancer Maternal Grandfather     lung  . Cancer Paternal Grandmother     Breast  . Heart disease Paternal Grandfather   . Stroke Paternal Grandfather   . ADD / ADHD Daughter     Social History   Social History  . Marital Status: Married    Spouse Name: N/A  . Number of Children: N/A  . Years of Education: N/A   Occupational History  . Not on file.   Social History Main Topics  . Smoking status: Never Smoker   . Smokeless tobacco:  Never Used  . Alcohol Use: Yes     Comment: special occasion  . Drug Use: No  . Sexual Activity:    Partners: Male   Other Topics Concern  . Not on file   Social History Narrative    Outpatient Prescriptions Prior to Visit  Medication Sig Dispense Refill  . benzonatate (TESSALON) 200 MG capsule Take 1 capsule (200 mg total) by mouth 2 (two) times daily as needed for cough. 20 capsule 0  . Biotin 10 MG CAPS Take 1 tablet by mouth daily.    . fluticasone (FLONASE) 50 MCG/ACT nasal spray 1-2 sprays each nostril once daily for allergies (Patient taking differently: daily as needed. 1-2 sprays each nostril once daily for allergies) 16 g 3  . L-LYSINE PO Take by mouth.    . Probiotic Product (PROBIOTIC DAILY PO) Take 1 capsule by mouth daily.    . sertraline (ZOLOFT) 100 MG tablet TAKE 1.5 TABLETS (150 MG TOTAL) BY MOUTH DAILY. (Patient taking differently: TAKE 1 TABLETS (100 MG TOTAL) BY MOUTH DAILY.) 45 tablet 3    . vitamin B-12 (CYANOCOBALAMIN) 1000 MCG tablet Take 1,000 mcg by mouth daily.      Marland Kitchen doxycycline (VIBRAMYCIN) 100 MG capsule Take 1 capsule (100 mg total) by mouth 2 (two) times daily. 20 capsule 0   Facility-Administered Medications Prior to Visit  Medication Dose Route Frequency Provider Last Rate Last Dose  . 0.9 %  sodium chloride infusion  500 mL Intravenous Continuous Sable Feil, MD        Allergies  Allergen Reactions  . Itraconazole     REACTION: Rash  . Levaquin [Levofloxacin In D5w]     Joint pain    Review of Systems  Constitutional: Negative for fever and malaise/fatigue.  HENT: Positive for congestion.   Eyes: Negative for discharge.  Respiratory: Positive for cough and sputum production. Negative for shortness of breath.   Cardiovascular: Negative for chest pain, palpitations and leg swelling.  Gastrointestinal: Negative for nausea and abdominal pain.  Genitourinary: Negative for dysuria.  Musculoskeletal: Negative for falls.  Skin: Negative for rash.  Neurological: Negative for loss of consciousness and headaches.  Endo/Heme/Allergies: Negative for environmental allergies.  Psychiatric/Behavioral: Negative for depression. The patient is not nervous/anxious.        Objective:    Physical Exam  Constitutional: She is oriented to person, place, and time. She appears well-developed and well-nourished. No distress.  HENT:  Head: Normocephalic and atraumatic.  Nose: Nose normal.  Eyes: Right eye exhibits no discharge. Left eye exhibits no discharge.  Neck: Normal range of motion. Neck supple.  Cardiovascular: Normal rate and regular rhythm.   No murmur heard. Pulmonary/Chest: Effort normal and breath sounds normal.  Abdominal: Soft. Bowel sounds are normal. There is no tenderness.  Musculoskeletal: She exhibits no edema.  Neurological: She is alert and oriented to person, place, and time.  Skin: Skin is warm and dry.  Psychiatric: She has a normal  mood and affect.  Nursing note and vitals reviewed.   BP 109/72 mmHg  Pulse 78  Temp(Src) 98.2 F (36.8 C) (Oral)  Ht 5\' 2"  (1.575 m)  Wt 131 lb (59.421 kg)  BMI 23.95 kg/m2  SpO2 96% Wt Readings from Last 3 Encounters:  10/04/14 131 lb (59.421 kg)  09/29/14 130 lb 8 oz (59.194 kg)  02/15/14 132 lb (59.875 kg)     Lab Results  Component Value Date   WBC 8.0 02/15/2014   HGB 13.0 02/15/2014  HCT 40.7 02/15/2014   PLT 285 02/15/2014   GLUCOSE 84 02/15/2014   CHOL 140 09/18/2013   TRIG 55.0 09/18/2013   HDL 67.30 09/18/2013   LDLCALC 62 09/18/2013   ALT 17 02/15/2014   AST 24 02/15/2014   NA 140 02/15/2014   K 4.2 02/15/2014   CL 102 02/15/2014   CREATININE 0.71 02/15/2014   BUN 14 02/15/2014   CO2 29 02/15/2014   TSH 0.964 02/15/2014   INR 1.1 02/25/2007   HGBA1C 6.0 10/10/2011    Lab Results  Component Value Date   TSH 0.964 02/15/2014   Lab Results  Component Value Date   WBC 8.0 02/15/2014   HGB 13.0 02/15/2014   HCT 40.7 02/15/2014   MCV 90.6 02/15/2014   PLT 285 02/15/2014   Lab Results  Component Value Date   NA 140 02/15/2014   K 4.2 02/15/2014   CO2 29 02/15/2014   GLUCOSE 84 02/15/2014   BUN 14 02/15/2014   CREATININE 0.71 02/15/2014   BILITOT 0.4 02/15/2014   ALKPHOS 86 02/15/2014   AST 24 02/15/2014   ALT 17 02/15/2014   PROT 6.4 02/15/2014   ALBUMIN 4.0 02/15/2014   CALCIUM 9.4 02/15/2014   GFR 71.34 09/18/2013   Lab Results  Component Value Date   CHOL 140 09/18/2013   Lab Results  Component Value Date   HDL 67.30 09/18/2013   Lab Results  Component Value Date   LDLCALC 62 09/18/2013   Lab Results  Component Value Date   TRIG 55.0 09/18/2013   Lab Results  Component Value Date   CHOLHDL 2 09/18/2013   Lab Results  Component Value Date   HGBA1C 6.0 10/10/2011       Assessment & Plan:   Problem List Items Addressed This Visit    Palpitations    No recent episodes, doing well      CAP (community acquired  pneumonia) - Primary    Feeling better with treatment will need follow up CXR in 1-2 months to assess resolution. Finish course of antibiotics, Encouraged increased rest and hydration, add probiotics, zinc such as Coldeze or Xicam. Treat fevers as needed         I am having Ms. Mulligan maintain her vitamin B-12, Biotin, Probiotic Product (PROBIOTIC DAILY PO), fluticasone, L-LYSINE PO, sertraline, and benzonatate. We will continue to administer sodium chloride.  No orders of the defined types were placed in this encounter.     Penni Homans, MD

## 2014-10-10 NOTE — Assessment & Plan Note (Signed)
Feeling better with treatment will need follow up CXR in 1-2 months to assess resolution. Finish course of antibiotics, Encouraged increased rest and hydration, add probiotics, zinc such as Coldeze or Xicam. Treat fevers as needed

## 2014-10-10 NOTE — Assessment & Plan Note (Signed)
No recent episodes, doing well

## 2014-11-02 ENCOUNTER — Ambulatory Visit (HOSPITAL_BASED_OUTPATIENT_CLINIC_OR_DEPARTMENT_OTHER)
Admission: RE | Admit: 2014-11-02 | Discharge: 2014-11-02 | Disposition: A | Discharge status: Home / Self Care | Payer: Managed Care, Other (non HMO) | Source: Ambulatory Visit | Attending: Physician Assistant | Admitting: Physician Assistant

## 2014-11-02 ENCOUNTER — Encounter: Payer: Self-pay | Admitting: Physician Assistant

## 2014-11-02 ENCOUNTER — Ambulatory Visit (INDEPENDENT_AMBULATORY_CARE_PROVIDER_SITE_OTHER): Payer: Managed Care, Other (non HMO) | Admitting: Physician Assistant

## 2014-11-02 VITALS — BP 119/69 | HR 74 | Temp 97.9°F | Resp 16 | Ht 62.0 in | Wt 126.5 lb

## 2014-11-02 DIAGNOSIS — J189 Pneumonia, unspecified organism: Secondary | ICD-10-CM | POA: Insufficient documentation

## 2014-11-02 DIAGNOSIS — Z23 Encounter for immunization: Secondary | ICD-10-CM

## 2014-11-02 NOTE — Patient Instructions (Signed)
I am glad you are feeling better! Your exam does not reveal any sign of residual infection. Please go downstairs for a repeat chest x-ray. We will call with your results.  Follow-up with Dr. Charlett Blake as scheduled.

## 2014-11-02 NOTE — Progress Notes (Signed)
Pre visit review using our clinic review tool, if applicable. No additional management support is needed unless otherwise documented below in the visit note/SLS  

## 2014-11-02 NOTE — Progress Notes (Signed)
Patient presents to clinic today for assessment and repeat CXR following a bout with CAP. Patient last seen one month ago where it was advised she return in 1 month for follow-up imaging. Patient denies recurrence of symptoms. Endorses feeling much better with energy and full-breathing.   Past Medical History  Diagnosis Date  . Allergy     rhinitis  . Anemia     iron deficeincy  . Depression   . Lumbar compression fracture (HCC)     nonsurgical  . SHOULDER PAIN, BILATERAL 10/10/2007  . PERIMENOPAUSAL STATUS 03/16/2010  . OSTEOARTHRITIS 10/24/2006  . Leiomyoma of uterus, unspecified 02/15/2010  . FRACTURE, NOSE 10/22/2007  . DEPRESSION 10/24/2006  . BASAL CELL CARCINOMA SKIN LOWER LIMB INCL HIP 02/15/2010  . ANEMIA-NOS 10/24/2006  . ALLERGIC RHINITIS 08/15/2006  . Dyspepsia 04/19/2010  . Acute sinusitis 07/31/2006  . BCC (basal cell carcinoma of skin) 07/10/2011    h/o  . 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 Insomnia 01/23/2012  . Preventative health care 08/31/2012  . Post-menopause 03/16/2010    Qualifier: Diagnosis of  By: Charlett Blake MD, Erline Levine      Current Outpatient Prescriptions on File Prior to Visit  Medication Sig Dispense Refill  . Biotin 10 MG CAPS Take 1 tablet by mouth daily.    Marland Kitchen L-LYSINE PO Take by mouth.    . Probiotic Product (PROBIOTIC DAILY PO) Take 1 capsule by mouth daily.    . vitamin B-12 (CYANOCOBALAMIN) 1000 MCG tablet Take 1,000 mcg by mouth daily.       Current Facility-Administered Medications on File Prior to Visit  Medication Dose Route Frequency Provider Last Rate Last Dose  . 0.9 %  sodium chloride infusion  500 mL Intravenous Continuous Sable Feil, MD        Allergies  Allergen Reactions  . Itraconazole     REACTION: Rash  . Levaquin [Levofloxacin In D5w]     Joint pain    Family History  Problem Relation Age of Onset  . Cholelithiasis Mother   . Other Mother     Urinary incontince/ bladder prolapse/ h/o smoking  . Cancer  Mother     laryngeal carcinoma  . Allergies Mother   . Arthritis Mother     s/p knee and shoulder replacement  . Dementia Mother     lewy body  . Other Father     Renal failure  . Hypertension Father   . Coronary artery disease Father     s/p bypass  . Aortic aneurysm Father   . Hyperlipidemia Father   . Heart disease Father 59    quadruple bypass  . Spina bifida Brother     with stunt/ self cath  . Seizures Brother     disorders  . ADD / ADHD Daughter   . Cancer Maternal Grandmother     colon/ smoker  . Cancer Maternal Grandfather     lung  . Cancer Paternal Grandmother     Breast  . Heart disease Paternal Grandfather   . Stroke Paternal Grandfather   . ADD / ADHD Daughter     Social History   Social History  . Marital Status: Married    Spouse Name: N/A  . Number of Children: N/A  . Years of Education: N/A   Social History Main Topics  . Smoking status: Never Smoker   . Smokeless tobacco: Never Used  . Alcohol Use: Yes     Comment: special occasion  . Drug Use:  No  . Sexual Activity:    Partners: Male   Other Topics Concern  . None   Social History Narrative   Review of Systems - See HPI.  All other ROS are negative.  BP 119/69 mmHg  Pulse 74  Temp(Src) 97.9 F (36.6 C) (Oral)  Resp 16  Ht 5\' 2"  (1.575 m)  Wt 126 lb 8 oz (57.38 kg)  BMI 23.13 kg/m2  SpO2 100%  Physical Exam  Constitutional: She is oriented to person, place, and time and well-developed, well-nourished, and in no distress.  HENT:  Head: Normocephalic and atraumatic.  Eyes: Conjunctivae are normal.  Cardiovascular: Normal rate, regular rhythm, normal heart sounds and intact distal pulses.   Pulmonary/Chest: Effort normal and breath sounds normal. No respiratory distress. She has no wheezes. She has no rales. She exhibits no tenderness.  Neurological: She is alert and oriented to person, place, and time.  Skin: Skin is warm and dry. No rash noted.  Psychiatric: Affect normal.    Vitals reviewed.  No results found for this or any previous visit (from the past 2160 hour(s)).  Assessment/Plan: CAP (community acquired pneumonia) Clinically resolved. Vitals and exam within normal limits. Will obtain repet x-ray today. Flu shot given today.

## 2014-11-02 NOTE — Assessment & Plan Note (Signed)
Clinically resolved. Vitals and exam within normal limits. Will obtain repet x-ray today. Flu shot given today.

## 2014-11-22 ENCOUNTER — Other Ambulatory Visit: Payer: Self-pay | Admitting: Family Medicine

## 2015-01-07 ENCOUNTER — Telehealth: Payer: Self-pay | Admitting: Family Medicine

## 2015-01-07 MED ORDER — AMOXICILLIN-POT CLAVULANATE 875-125 MG PO TABS: 1.0000 | ORAL_TABLET | Freq: Two times a day (BID) | ORAL | Status: DC

## 2015-01-07 NOTE — Telephone Encounter (Signed)
Relation to [id number] Call back number:[phone] Pharmacy: [id number] - [place], [place] [phone] (Phone) [phone] (Fax)         Reason for call:  Patient states she has chronic sinus infection and requesting MD to prescribe Augmentin, patient states MD has done so before. Patient also states [facility] and the prescribed Augmentin and normally it takes 2 to knock out the sytmo

## 2015-01-07 NOTE — Telephone Encounter (Signed)
OK to Augmentin XR 1 tab po bid x 14 days

## 2015-01-07 NOTE — Telephone Encounter (Signed)
Prescription sent in and called the patient on both home/cell number left msg. Prescription sent in.

## 2015-02-09 ENCOUNTER — Ambulatory Visit: Payer: Managed Care, Other (non HMO) | Admitting: Medical

## 2015-02-09 ENCOUNTER — Ambulatory Visit (INDEPENDENT_AMBULATORY_CARE_PROVIDER_SITE_OTHER): Payer: Managed Care, Other (non HMO) | Admitting: Medical

## 2015-02-09 ENCOUNTER — Encounter: Payer: Self-pay | Admitting: Medical

## 2015-02-09 VITALS — BP 108/64 | HR 84 | Temp 98.7°F | Ht 62.0 in | Wt 127.6 lb

## 2015-02-09 DIAGNOSIS — J0110 Acute frontal sinusitis, unspecified: Secondary | ICD-10-CM

## 2015-02-09 MED ORDER — AMOXICILLIN-POT CLAVULANATE 875-125 MG PO TABS: 1.0000 | ORAL_TABLET | Freq: Two times a day (BID) | ORAL | Status: DC

## 2015-02-09 MED ORDER — AZELASTINE HCL 0.1 % NA SOLN: 2.0000 | Freq: Two times a day (BID) | NASAL | Status: DC

## 2015-02-09 NOTE — Patient Instructions (Addendum)
You appear to have a sinus infection. I am prescribing  augmentin antibiotic for the infection. To help with the nasal congestion I prescribed astelin. For your associated cough use your benzonatate  Extended augmentin to 14 days due to history as we discussed  Rest, hydrate, tylenol for fever.  Follow up in 7 days or as needed.

## 2015-02-09 NOTE — Progress Notes (Signed)
Subjective:    Patient ID: Jennifer Sandoval, female    DOB: 11-Dec-1955, 60 y.o.   MRN: [id number]  HPI  Pt in with some sinus pain(for 2 weeks). She states hx of chronic intermittent sinus problems. Pt had fever the other day.   Pt describes nasal congestion, sinus pressure and yellow/brown mucous when blows nose.   Now some faint chest congestion.  Last time she had this ended up with pneumonia  Pt is on mucinex.  Pt has tried nasal sprays in past but states they don't help her much. But only steroid sparys. Never tried astelin per her reprort.  Pt has some allergies. Used to by on zytec but not now.     Review of Systems  Constitutional: Positive for fever. Negative for chills and fatigue.  HENT: Positive for congestion, postnasal drip, rhinorrhea and sinus pressure.   Respiratory: Positive for cough. Negative for choking, shortness of breath and wheezing.   Cardiovascular: Negative for chest pain and palpitations.  Gastrointestinal: Negative for abdominal pain.  Musculoskeletal: Negative for back pain.  Neurological: Negative for headaches.  Hematological: Negative for adenopathy. Does not bruise/bleed easily.    Past Medical History  Diagnosis Date  . Allergy     rhinitis  . Anemia     iron deficeincy  . Depression   . Lumbar compression fracture (HCC)     nonsurgical  . SHOULDER PAIN, BILATERAL 10/10/2007  . PERIMENOPAUSAL STATUS 03/16/2010  . OSTEOARTHRITIS 10/24/2006  . Leiomyoma of uterus, unspecified 02/15/2010  . FRACTURE, NOSE 10/22/2007  . DEPRESSION 10/24/2006  . BASAL CELL CARCINOMA SKIN LOWER LIMB INCL HIP 02/15/2010  . ANEMIA-NOS 10/24/2006  . ALLERGIC RHINITIS 08/15/2006  . Dyspepsia 04/19/2010  . Acute sinusitis 07/31/2006  . BCC (basal cell carcinoma of skin) 07/10/2011    h/o  . 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 Insomnia 01/23/2012  . Preventative health care 08/31/2012  . Post-menopause 03/16/2010    Qualifier: Diagnosis of  By: Charlett Blake MD,  Erline Levine      Social History   Social History  . Marital Status: Married    Spouse Name: N/A  . Number of Children: N/A  . Years of Education: N/A   Occupational History  . Not on file.   Social History Main Topics  . Smoking status: Never Smoker   . Smokeless tobacco: Never Used  . Alcohol Use: Yes     Comment: special occasion  . Drug Use: No  . Sexual Activity:    Partners: Male   Other Topics Concern  . Not on file   Social History Narrative    Past Surgical History  Procedure Laterality Date  . Septoplasty    . Gastric bypass    . Tubal ligation    . Nasal sinus surgery    . Cesarean section      X 3  . Appendectomy    . Cholecystectomy    . Ventral herniorrhaphy      X 2  . Abdominal hysterectomy  2009    partial, ovaries left in place, for painful, heavy menstrrual bleeding secondary to anemia and fibroids  . Skin biopsy      L hip BCC, facial lesions were benign    Family History  Problem Relation Age of Onset  . Cholelithiasis Mother   . Other Mother     Urinary incontince/ bladder prolapse/ h/o smoking  . Cancer Mother     laryngeal carcinoma  . Allergies Mother   .  Arthritis Mother     s/p knee and shoulder replacement  . Dementia Mother     lewy body  . Other Father     Renal failure  . Hypertension Father   . Coronary artery disease Father     s/p bypass  . Aortic aneurysm Father   . Hyperlipidemia Father   . Heart disease Father 28    quadruple bypass  . Spina bifida Brother     with stunt/ self cath  . Seizures Brother     disorders  . ADD / ADHD Daughter   . Cancer Maternal Grandmother     colon/ smoker  . Cancer Maternal Grandfather     lung  . Cancer Paternal Grandmother     Breast  . Heart disease Paternal Grandfather   . Stroke Paternal Grandfather   . ADD / ADHD Daughter     Allergies  Allergen Reactions  . Itraconazole     REACTION: Rash  . Levaquin [Levofloxacin In D5w]     Joint pain    Current  Outpatient Prescriptions on File Prior to Visit  Medication Sig Dispense Refill  . Biotin 10 MG CAPS Take 1 tablet by mouth daily.    . calcium-vitamin D (OSCAL WITH D) 500-200 MG-UNIT tablet Take 1 tablet by mouth daily.    Marland Kitchen L-LYSINE PO Take by mouth.    . Omega-3 Krill Oil 1000 MG CAPS Take by mouth daily.    . Probiotic Product (PROBIOTIC DAILY PO) Take 1 capsule by mouth daily.    . sertraline (ZOLOFT) 100 MG tablet Take 100 mg by mouth daily.    . vitamin B-12 (CYANOCOBALAMIN) 1000 MCG tablet Take 1,000 mcg by mouth daily.      Marland Kitchen zinc gluconate 50 MG tablet Take 50 mg by mouth daily.     Current Facility-Administered Medications on File Prior to Visit  Medication Dose Route Frequency Provider Last Rate Last Dose  . 0.9 %  sodium chloride infusion  500 mL Intravenous Continuous Sable Feil, MD        BP 108/64 mmHg  Pulse 84  Temp(Src) 98.7 F (37.1 C) (Oral)  Ht 5\' 2"  (1.575 m)  Wt 127 lb 9.6 oz (57.879 kg)  BMI 23.33 kg/m2  SpO2 96%       Objective:   Physical Exam   General  Mental Status - Alert. General Appearance - Well groomed. Not in acute distress.  Skin Rashes- No Rashes.  HEENT Head- Normal. Ear Auditory Canal - Left- Normal. Right - Normal.Tympanic Membrane- Left- Normal. Right- Normal. Eye Sclera/Conjunctiva- Left- Normal. Right- Normal. Nose & Sinuses Nasal Mucosa- Left-  Boggy and Congested. Right-  Boggy and  Congested.Bilateral maxillary and frontal sinus pressure.  Mouth & Throat Lips: Upper Lip- Normal: no dryness, cracking, pallor, cyanosis, or vesicular eruption. Lower Lip-Normal: no dryness, cracking, pallor, cyanosis or vesicular eruption. Buccal Mucosa- Bilateral- No Aphthous ulcers. Oropharynx- No Discharge or Erythema. Tonsils: Characteristics- Bilateral- No Erythema or Congestion. Size/Enlargement- Bilateral- No enlargement. Discharge- bilateral-None.  Neck Neck- Supple. No Masses.   Chest and Lung  Exam Auscultation: Breath Sounds:-Clear even and unlabored.  Cardiovascular Auscultation:Rythm- Regular, rate and rhythm. Murmurs & Other Heart Sounds:Ausculatation of the heart reveal- No Murmurs.  Lymphatic Head & Neck General Head & Neck Lymphatics: Bilateral: Description- No Localized lymphadenopathy.      Assessment & Plan:  You  appear to have a sinus infection. I am prescribing  augmentin antibiotic for the infection. To help with  the nasal congestion I prescribed astelin. For your associated cough use your benzonatate  Extended augmentin to 14 days due to history as we discussed  Rest, hydrate, tylenol for fever.  Follow up in 7 days or as needed.

## 2015-02-09 NOTE — Progress Notes (Signed)
Pre visit review using our clinic review tool, if applicable. No additional management support is needed unless otherwise documented below in the visit note. 

## 2015-05-24 ENCOUNTER — Other Ambulatory Visit: Payer: Self-pay | Admitting: Family Medicine

## 2015-09-19 ENCOUNTER — Other Ambulatory Visit: Payer: Self-pay | Admitting: Family Medicine

## 2015-09-19 MED ORDER — SERTRALINE HCL 100 MG PO TABS: ORAL_TABLET | ORAL | 0 refills | Status: DC

## 2016-01-06 ENCOUNTER — Ambulatory Visit (INDEPENDENT_AMBULATORY_CARE_PROVIDER_SITE_OTHER): Payer: Managed Care, Other (non HMO) | Admitting: Family Medicine

## 2016-01-06 ENCOUNTER — Encounter: Payer: Self-pay | Admitting: Family Medicine

## 2016-01-06 DIAGNOSIS — J019 Acute sinusitis, unspecified: Secondary | ICD-10-CM | POA: Diagnosis not present

## 2016-01-06 DIAGNOSIS — B9689 Other specified bacterial agents as the cause of diseases classified elsewhere: Secondary | ICD-10-CM

## 2016-01-06 MED ORDER — FLUCONAZOLE 150 MG PO TABS: 150.0000 mg | ORAL_TABLET | ORAL | 0 refills | Status: DC

## 2016-01-06 MED ORDER — AMOXICILLIN-POT CLAVULANATE 875-125 MG PO TABS: 1.0000 | ORAL_TABLET | Freq: Two times a day (BID) | ORAL | 0 refills | Status: DC

## 2016-01-06 NOTE — Patient Instructions (Addendum)
[employer] Aged or black garlic,  [place]berry Vitamin C 500 to 1000 mg daily  Zinc Mucinex twice daily 64 oz fluid Sinusitis, Adult Sinusitis is soreness and inflammation of your sinuses. Sinuses are hollow spaces in the bones around your face. Your sinuses are located:  Around your eyes.  In the middle of your forehead.  Behind your nose.  In your cheekbones. Your sinuses and nasal passages are lined with a stringy fluid (mucus). Mucus normally drains out of your sinuses. When your nasal tissues become inflamed or swollen, the mucus can become trapped or blocked so air cannot flow through your sinuses. This allows bacteria, viruses, and funguses to grow, which leads to infection. Sinusitis can develop quickly and last for 7?10 days (acute) or for more than 12 weeks (chronic). Sinusitis often develops after a cold. What are the causes? This condition is caused by anything that creates swelling in the sinuses or stops mucus from draining, including:  Allergies.  Asthma.  Bacterial or viral infection.  Abnormally shaped bones between the nasal passages.  Nasal growths that contain mucus (nasal polyps).  Narrow sinus openings.  Pollutants, such as chemicals or irritants in the air.  A foreign object stuck in the nose.  A fungal infection. This is rare. What increases the risk? The following factors may make you more likely to develop this condition:  Having allergies or asthma.  Having had a recent cold or respiratory tract infection.  Having structural deformities or blockages in your nose or sinuses.  Having a weak immune system.  Doing a lot of swimming or diving.  Overusing nasal sprays.  Smoking. What are the signs or symptoms? The main symptoms of this condition are pain and a feeling of pressure around the affected sinuses. Other symptoms include:  Upper toothache.  Earache.  Headache.  Bad breath.  Decreased sense of smell and  taste.  A cough that may get worse at night.  Fatigue.  Fever.  Thick drainage from your nose. The drainage is often green and it may contain pus (purulent).  Stuffy nose or congestion.  Postnasal drip. This is when extra mucus collects in the throat or back of the nose.  Swelling and warmth over the affected sinuses.  Sore throat.  Sensitivity to light. How is this diagnosed? This condition is diagnosed based on symptoms, a medical history, and a physical exam. To find out if your condition is acute or chronic, your health care provider may:  Look in your nose for signs of nasal polyps.  Tap over the affected sinus to check for signs of infection.  View the inside of your sinuses using an imaging device that has a light attached (endoscope). If your health care provider suspects that you have chronic sinusitis, you may also:  Be tested for allergies.  Have a sample of mucus taken from your nose (nasal culture) and checked for bacteria.  Have a mucus sample examined to see if your sinusitis is related to an allergy. If your sinusitis does not respond to treatment and it lasts longer than 8 weeks, you may have an MRI or CT scan to check your sinuses. These scans also help to determine how severe your infection is. In rare cases, a bone biopsy may be done to rule out more serious types of fungal sinus disease. How is this treated? Treatment for sinusitis depends on the cause and whether your condition is chronic or acute. If a virus is causing your sinusitis, your symptoms  will go away on their own within 10 days. You may be given medicines to relieve your symptoms, including:  Topical nasal decongestants. They shrink swollen nasal passages and let mucus drain from your sinuses.  Antihistamines. These drugs block inflammation that is triggered by allergies. This can help to ease swelling in your nose and sinuses.  Topical nasal corticosteroids. These are nasal sprays that  ease inflammation and swelling in your nose and sinuses.  Nasal saline washes. These rinses can help to get rid of thick mucus in your nose. If your condition is caused by bacteria, you will be given an antibiotic medicine. If your condition is caused by a fungus, you will be given an antifungal medicine. Surgery may be needed to correct underlying conditions, such as narrow nasal passages. Surgery may also be needed to remove polyps. Follow these instructions at home: Medicines  Take, use, or apply over-the-counter and prescription medicines only as told by your health care provider. These may include nasal sprays.  If you were prescribed an antibiotic medicine, take it as told by your health care provider. Do not stop taking the antibiotic even if you start to feel better. Hydrate and Humidify  Drink enough water to keep your urine clear or pale yellow. Staying hydrated will help to thin your mucus.  Use a cool mist humidifier to keep the humidity level in your home above 50%.  Inhale steam for 10-15 minutes, 3-4 times a day or as told by your health care provider. You can do this in the bathroom while a hot shower is running.  Limit your exposure to cool or dry air. Rest  Rest as much as possible.  Sleep with your head raised (elevated).  Make sure to get enough sleep each night. General instructions  Apply a warm, moist washcloth to your face 3-4 times a day or as told by your health care provider. This will help with discomfort.  Wash your hands often with soap and water to reduce your exposure to viruses and other germs. If soap and water are not available, use hand sanitizer.  Do not smoke. Avoid being around people who are smoking (secondhand smoke).  Keep all follow-up visits as told by your health care provider. This is important. Contact a health care provider if:  You have a fever.  Your symptoms get worse.  Your symptoms do not improve within 10 days. Get help  right away if:  You have a severe headache.  You have persistent vomiting.  You have pain or swelling around your face or eyes.  You have vision problems.  You develop confusion.  Your neck is stiff.  You have trouble breathing. This information is not intended to replace advice given to you by your health care provider. Make sure you discuss any questions you have with your health care provider. Document Released: 12/18/2004 Document Revised: 08/14/2015 Document Reviewed: 10/13/2014 Elsevier Interactive Patient Education  2017 [employer].

## 2016-01-06 NOTE — Progress Notes (Signed)
Pre visit review using our clinic review tool, if applicable. No additional management support is needed unless otherwise documented below in the visit note. 

## 2016-01-06 NOTE — Progress Notes (Signed)
Subjective:    Patient ID: Jennifer Sandoval, female    DOB: 06-03-1955, 61 y.o.   MRN: [id number]  Chief Complaint  Patient presents with  . Sinusitis    HPI Patient is in today for possible sinus infection. She has been struggling with facial pressure, congestion, malaise, PND, cough. Symptoms have been ongoing for several weeks. She has tried to increase rest, take zinc and probiotics but symptoms continue to worsen.Denies CP/palp/SOB/HA/fevers/GI or GU c/o. Taking meds as prescribed  Past Medical History:  Diagnosis Date  . Acute sinusitis 07/31/2006  . ALLERGIC RHINITIS 08/15/2006  . Allergy    rhinitis  . Anemia    iron deficeincy  . ANEMIA-NOS 10/24/2006  . BASAL CELL CARCINOMA SKIN LOWER LIMB INCL HIP 02/15/2010  . BCC (basal cell carcinoma of skin) 07/10/2011   h/o  . Depression   . DEPRESSION 10/24/2006  . 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 Dyspepsia 04/19/2010  . FRACTURE, NOSE 10/22/2007  . Insomnia 01/23/2012  . Leiomyoma of uterus, unspecified 02/15/2010  . Lumbar compression fracture ([place])    nonsurgical  . OSTEOARTHRITIS 10/24/2006  . PERIMENOPAUSAL STATUS 03/16/2010  . Post-menopause 03/16/2010   Qualifier: Diagnosis of  By: Charlett Blake MD, Erline Levine    . Preventative health care 08/31/2012  . SHOULDER PAIN, BILATERAL 10/10/2007    Past Surgical History:  Procedure Laterality Date  . ABDOMINAL HYSTERECTOMY  2009   partial, ovaries left in place, for painful, heavy menstrrual bleeding secondary to anemia and fibroids  . APPENDECTOMY    . CESAREAN SECTION     X 3  . CHOLECYSTECTOMY    . GASTRIC BYPASS    . NASAL SINUS SURGERY    . SEPTOPLASTY    . skin biopsy     L hip BCC, facial lesions were benign  . TUBAL LIGATION    . ventral herniorrhaphy     X 2    Family History  Problem Relation Age of Onset  . Cholelithiasis Mother   . Other Mother     Urinary incontince/ bladder prolapse/ h/o smoking  . Cancer Mother     laryngeal carcinoma  . Allergies  Mother   . Arthritis Mother     s/p knee and shoulder replacement  . Dementia Mother     lewy body  . Other Father     Renal failure  . Hypertension Father   . Coronary artery disease Father     s/p bypass  . Aortic aneurysm Father   . Hyperlipidemia Father   . Heart disease Father 63    quadruple bypass  . Spina bifida Brother     with stunt/ self cath  . Seizures Brother     disorders  . ADD / ADHD Daughter   . Cancer Maternal Grandmother     colon/ smoker  . Cancer Maternal Grandfather     lung  . Cancer Paternal Grandmother     Breast  . Heart disease Paternal Grandfather   . Stroke Paternal Grandfather   . ADD / ADHD Daughter     Social History   Social History  . Marital status: Married    Spouse name: N/A  . Number of children: N/A  . Years of education: N/A   Occupational History  . Not on file.   Social History Main Topics  . Smoking status: Never Smoker  . Smokeless tobacco: Never Used  . Alcohol use Yes     Comment: special occasion  . Drug use:  No  . Sexual activity: Yes    Partners: Male   Other Topics Concern  . Not on file   Social History Narrative  . No narrative on file    Outpatient Medications Prior to Visit  Medication Sig Dispense Refill  . Biotin 10 MG CAPS Take 1 tablet by mouth daily.    . calcium-vitamin D (OSCAL WITH D) 500-200 MG-UNIT tablet Take 1 tablet by mouth daily.    Marland Kitchen L-LYSINE PO Take by mouth.    . Omega-3 Krill Oil 1000 MG CAPS Take by mouth daily.    . Probiotic Product (PROBIOTIC DAILY PO) Take 1 capsule by mouth daily.    . sertraline (ZOLOFT) 100 MG tablet Take 100 mg by mouth daily.    . vitamin B-12 (CYANOCOBALAMIN) 1000 MCG tablet Take 1,000 mcg by mouth daily.      Marland Kitchen zinc gluconate 50 MG tablet Take 50 mg by mouth daily.    Marland Kitchen azelastine (ASTELIN) 0.1 % nasal spray Place 2 sprays into both nostrils 2 (two) times daily. Use in each nostril as directed (Patient not taking: Reported on 01/06/2016) 30 mL 2  .  amoxicillin-clavulanate (AUGMENTIN) 875-125 MG tablet Take 1 tablet by mouth 2 (two) times daily. (Patient not taking: Reported on 01/06/2016) 28 tablet 0  . sertraline (ZOLOFT) 100 MG tablet Take 1 and 1/2 tablets (150 mg total) by mouth daily (Patient not taking: Reported on 01/06/2016) 135 tablet 0   Facility-Administered Medications Prior to Visit  Medication Dose Route Frequency Provider Last Rate Last Dose  . 0.9 %  sodium chloride infusion  500 mL Intravenous Continuous Sable Feil, MD        Allergies  Allergen Reactions  . Itraconazole     REACTION: Rash  . Levaquin [Levofloxacin In D5w]     Joint pain    Review of Systems  Constitutional: Positive for malaise/fatigue. Negative for fever.  HENT: Positive for congestion and sinus pain.        Sinus Pressure.  Eyes: Negative for blurred vision.       Runny eyes.  Respiratory: Negative for cough and shortness of breath.   Cardiovascular: Positive for PND. Negative for chest pain, palpitations and leg swelling.  Gastrointestinal: Negative for vomiting.  Musculoskeletal: Negative for back pain.  Skin: Negative for rash.  Neurological: Positive for headaches. Negative for loss of consciousness.       Objective:    Physical Exam  Constitutional: She is oriented to person, place, and time. She appears well-developed and well-nourished. No distress.  HENT:  Head: Normocephalic and atraumatic.  Nasal mucosa boggy and erythematous  Eyes: Conjunctivae are normal.  Neck: Normal range of motion. No thyromegaly present.  Cardiovascular: Normal rate and regular rhythm.   Pulmonary/Chest: Effort normal and breath sounds normal. She has no wheezes.  Abdominal: Soft. Bowel sounds are normal. There is no tenderness.  Musculoskeletal: She exhibits no edema or deformity.  Neurological: She is alert and oriented to person, place, and time.  Skin: Skin is warm and dry. She is not diaphoretic.  Psychiatric: She has a normal mood and  affect.    BP 116/72 (BP Location: Left Arm, Patient Position: Sitting, Cuff Size: Normal)   Pulse 78   Temp 98.1 F (36.7 C) (Oral)   Wt 128 lb 3.2 oz (58.2 kg)   SpO2 98% Comment: RA  BMI 23.45 kg/m  Wt Readings from Last 3 Encounters:  01/06/16 128 lb 3.2 oz (58.2 kg)  02/09/15  127 lb 9.6 oz (57.9 kg)  11/02/14 126 lb 8 oz (57.4 kg)     Lab Results  Component Value Date   WBC 8.0 02/15/2014   HGB 13.0 02/15/2014   HCT 40.7 02/15/2014   PLT 285 02/15/2014   GLUCOSE 84 02/15/2014   CHOL 140 09/18/2013   TRIG 55.0 09/18/2013   HDL 67.30 09/18/2013   LDLCALC 62 09/18/2013   ALT 17 02/15/2014   AST 24 02/15/2014   NA 140 02/15/2014   K 4.2 02/15/2014   CL 102 02/15/2014   CREATININE 0.71 02/15/2014   BUN 14 02/15/2014   CO2 29 02/15/2014   TSH 0.964 02/15/2014   INR 1.1 02/25/2007   HGBA1C 6.0 10/10/2011    Lab Results  Component Value Date   TSH 0.964 02/15/2014   Lab Results  Component Value Date   WBC 8.0 02/15/2014   HGB 13.0 02/15/2014   HCT 40.7 02/15/2014   MCV 90.6 02/15/2014   PLT 285 02/15/2014   Lab Results  Component Value Date   NA 140 02/15/2014   K 4.2 02/15/2014   CO2 29 02/15/2014   GLUCOSE 84 02/15/2014   BUN 14 02/15/2014   CREATININE 0.71 02/15/2014   BILITOT 0.4 02/15/2014   ALKPHOS 86 02/15/2014   AST 24 02/15/2014   ALT 17 02/15/2014   PROT 6.4 02/15/2014   ALBUMIN 4.0 02/15/2014   CALCIUM 9.4 02/15/2014   GFR 71.34 09/18/2013   Lab Results  Component Value Date   CHOL 140 09/18/2013   Lab Results  Component Value Date   HDL 67.30 09/18/2013   Lab Results  Component Value Date   LDLCALC 62 09/18/2013   Lab Results  Component Value Date   TRIG 55.0 09/18/2013   Lab Results  Component Value Date   CHOLHDL 2 09/18/2013   Lab Results  Component Value Date   HGBA1C 6.0 10/10/2011      I acted as a Education administrator for Dr. Charlett Blake. [place], [place]  Assessment & Plan:   Problem List Items Addressed This Visit     Acute bacterial sinusitis    Started on antibiotics, mucinex and probiotics. Encouraged increased rest and hydration, add probiotics, zinc such as Coldeze or Xicam. Treat fevers as needed      Relevant Medications   amoxicillin-clavulanate (AUGMENTIN) 875-125 MG tablet   fluconazole (DIFLUCAN) 150 MG tablet      I have discontinued Ms. Evangelist's amoxicillin-clavulanate. I am also having her start on amoxicillin-clavulanate and fluconazole. Additionally, I am having her maintain her vitamin B-12, Biotin, Probiotic Product (PROBIOTIC DAILY PO), L-LYSINE PO, sertraline, zinc gluconate, Omega-3 Krill Oil, calcium-vitamin D, azelastine, and Melatonin. We will continue to administer sodium chloride.  Meds ordered this encounter  Medications  . Melatonin 3 MG CAPS    Sig: Take by mouth.  Marland Kitchen amoxicillin-clavulanate (AUGMENTIN) 875-125 MG tablet    Sig: Take 1 tablet by mouth 2 (two) times daily.    Dispense:  28 tablet    Refill:  0  . fluconazole (DIFLUCAN) 150 MG tablet    Sig: Take 1 tablet (150 mg total) by mouth once a week.    Dispense:  2 tablet    Refill:  0    CMA served as scribe during this visit. History, Physical and Plan performed by medical provider. Documentation and orders reviewed and attested to.  Penni Homans, MD

## 2016-01-06 NOTE — Progress Notes (Signed)
Patient ID: Jennifer Sandoval, female   DOB: [birth year], 61 y.o.   MRN: [id number]

## 2016-01-08 NOTE — Assessment & Plan Note (Signed)
Started on antibiotics, mucinex and probiotics. Encouraged increased rest and hydration, add probiotics, zinc such as Coldeze or Xicam. Treat fevers as needed

## 2016-01-30 ENCOUNTER — Ambulatory Visit (INDEPENDENT_AMBULATORY_CARE_PROVIDER_SITE_OTHER): Payer: Managed Care, Other (non HMO) | Admitting: Family Medicine

## 2016-01-30 ENCOUNTER — Ambulatory Visit (HOSPITAL_BASED_OUTPATIENT_CLINIC_OR_DEPARTMENT_OTHER)
Admission: RE | Admit: 2016-01-30 | Discharge: 2016-01-30 | Disposition: A | Discharge status: Home / Self Care | Payer: Managed Care, Other (non HMO) | Source: Ambulatory Visit | Attending: Family Medicine | Admitting: Family Medicine

## 2016-01-30 ENCOUNTER — Encounter: Payer: Self-pay | Admitting: Family Medicine

## 2016-01-30 VITALS — BP 108/64 | HR 69 | Temp 98.2°F | Ht 62.0 in | Wt 128.8 lb

## 2016-01-30 DIAGNOSIS — Z1231 Encounter for screening mammogram for malignant neoplasm of breast: Secondary | ICD-10-CM | POA: Diagnosis present

## 2016-01-30 DIAGNOSIS — Z8632 Personal history of gestational diabetes: Secondary | ICD-10-CM

## 2016-01-30 DIAGNOSIS — Z23 Encounter for immunization: Secondary | ICD-10-CM | POA: Diagnosis not present

## 2016-01-30 DIAGNOSIS — D509 Iron deficiency anemia, unspecified: Secondary | ICD-10-CM | POA: Diagnosis not present

## 2016-01-30 DIAGNOSIS — Z9889 Other specified postprocedural states: Secondary | ICD-10-CM | POA: Diagnosis not present

## 2016-01-30 DIAGNOSIS — Z1239 Encounter for other screening for malignant neoplasm of breast: Secondary | ICD-10-CM

## 2016-01-30 DIAGNOSIS — Z9884 Bariatric surgery status: Secondary | ICD-10-CM | POA: Insufficient documentation

## 2016-01-30 DIAGNOSIS — H04123 Dry eye syndrome of bilateral lacrimal glands: Secondary | ICD-10-CM | POA: Diagnosis not present

## 2016-01-30 DIAGNOSIS — Z780 Asymptomatic menopausal state: Secondary | ICD-10-CM | POA: Diagnosis not present

## 2016-01-30 DIAGNOSIS — Z0000 Encounter for general adult medical examination without abnormal findings: Secondary | ICD-10-CM

## 2016-01-30 HISTORY — DX: Personal history of gestational diabetes: Z86.32

## 2016-01-30 HISTORY — DX: Dry eye syndrome of bilateral lacrimal glands: H04.123

## 2016-01-30 LAB — COMPREHENSIVE METABOLIC PANEL
ALBUMIN: 4.3 g/dL (ref 3.5–5.2)
ALK PHOS: 84 U/L (ref 39–117)
ALT: 37 U/L — AB (ref 0–35)
AST: 34 U/L (ref 0–37)
BILIRUBIN TOTAL: 0.3 mg/dL (ref 0.2–1.2)
BUN: 14 mg/dL (ref 6–23)
CALCIUM: 9.2 mg/dL (ref 8.4–10.5)
CO2: 32 meq/L (ref 19–32)
CREATININE: 0.7 mg/dL (ref 0.40–1.20)
Chloride: 104 mEq/L (ref 96–112)
GFR: 90.93 mL/min (ref >60.00)
Glucose, Bld: 83 mg/dL (ref 70–99)
Potassium: 3.6 mEq/L (ref 3.5–5.1)
Sodium: 142 mEq/L (ref 135–145)
TOTAL PROTEIN: 6.5 g/dL (ref 6.0–8.3)

## 2016-01-30 LAB — FOLATE: Folate: >23.4 ng/mL (ref >5.9)

## 2016-01-30 LAB — CBC
HCT: 38.8 % (ref 36.0–46.0)
HEMOGLOBIN: 12.8 g/dL (ref 12.0–15.0)
MCHC: 32.9 g/dL (ref 30.0–36.0)
MCV: 89.5 fl (ref 78.0–100.0)
PLATELETS: 241 10*3/uL (ref 150.0–400.0)
RBC: 4.34 Mil/uL (ref 3.87–5.11)
RDW: 14.6 % (ref 11.5–15.5)
WBC: 7.2 10*3/uL (ref 4.0–10.5)

## 2016-01-30 LAB — LIPID PANEL
CHOL/HDL RATIO: 2
Cholesterol: 132 mg/dL (ref 0–200)
HDL: 67.7 mg/dL (ref >39.00)
LDL Cholesterol: 45 mg/dL (ref 0–99)
NonHDL: 63.92
TRIGLYCERIDES: 95 mg/dL (ref 0.0–149.0)
VLDL: 19 mg/dL (ref 0.0–40.0)

## 2016-01-30 LAB — VITAMIN D 25 HYDROXY (VIT D DEFICIENCY, FRACTURES): VITD: 69.1 ng/mL (ref 30.00–100.00)

## 2016-01-30 LAB — VITAMIN B12: VITAMIN B 12: 1322 pg/mL — AB (ref 211–911)

## 2016-01-30 LAB — TSH: TSH: 1.33 u[IU]/mL (ref 0.35–4.50)

## 2016-01-30 NOTE — Assessment & Plan Note (Signed)
Resolved with recent treatments

## 2016-01-30 NOTE — Assessment & Plan Note (Signed)
Patient encouraged to maintain heart healthy diet, regular exercise, adequate sleep. Consider daily probiotics. Take medications as prescribed. Given and reviewed copy of ACP documents from [place] Secretary of State and encouraged to complete and return 

## 2016-01-30 NOTE — Assessment & Plan Note (Signed)
Increase leafy greens, consider increased lean red meat and using cast iron cookware. Continue to monitor, report any concerns 

## 2016-01-30 NOTE — Assessment & Plan Note (Signed)
Has drops from opthamology but has not been using regular encouraged to use daily for a month and report to eye doctor if watery eyes persist

## 2016-01-30 NOTE — Progress Notes (Signed)
Subjective:    Patient ID: Jennifer Sandoval, female    DOB: [birth year], 61 y.o.   MRN: [id number]  Chief Complaint  Patient presents with  . Annual Exam    HPI Patient is in today for annual examination.Patient is also following up on osteoarthritis, anemia. No additional acute concerns noted. She has been feeling well. She has not had a recent sinus infection and she denies any recent hospitalizations. Denies CP/palp/SOB/HA/congestion/fevers/GI or GU c/o. Taking meds as prescribed  Past Medical History:  Diagnosis Date  . Acute sinusitis 07/31/2006  . ALLERGIC RHINITIS 08/15/2006  . Allergy    rhinitis  . Anemia    iron deficeincy  . ANEMIA-NOS 10/24/2006  . BASAL CELL CARCINOMA SKIN LOWER LIMB INCL HIP 02/15/2010  . BCC (basal cell carcinoma of skin) 07/10/2011   h/o  . Depression   . DEPRESSION 10/24/2006  . 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 Dry eyes, bilateral 01/30/2016  . Dyspepsia 04/19/2010  . FRACTURE, NOSE 10/22/2007  . History of gestational diabetes 01/30/2016  . Insomnia 01/23/2012  . Leiomyoma of uterus, unspecified 02/15/2010  . Lumbar compression fracture ([place])    nonsurgical  . OSTEOARTHRITIS 10/24/2006  . PERIMENOPAUSAL STATUS 03/16/2010  . Post-menopause 03/16/2010   Qualifier: Diagnosis of  By: Charlett Blake MD, Erline Levine    . Preventative health care 08/31/2012  . SHOULDER PAIN, BILATERAL 10/10/2007    Past Surgical History:  Procedure Laterality Date  . ABDOMINAL HYSTERECTOMY  2009   partial, ovaries left in place, for painful, heavy menstrrual bleeding secondary to anemia and fibroids  . APPENDECTOMY    . CESAREAN SECTION     X 3  . CHOLECYSTECTOMY    . GASTRIC BYPASS    . NASAL SINUS SURGERY    . SEPTOPLASTY    . skin biopsy     L hip BCC, facial lesions were benign  . TUBAL LIGATION    . ventral herniorrhaphy     X 2    Family History  Problem Relation Age of Onset  . Cholelithiasis Mother   . Other Mother     Urinary incontince/ bladder  prolapse/ h/o smoking  . Cancer Mother     laryngeal carcinoma  . Allergies Mother   . Arthritis Mother     s/p knee and shoulder replacement  . Dementia Mother     lewy body  . Other Father     Renal failure  . Hypertension Father   . Coronary artery disease Father     s/p bypass  . Aortic aneurysm Father   . Hyperlipidemia Father   . Heart disease Father 29    quadruple bypass  . Spina bifida Brother     with stunt/ self cath  . Seizures Brother     disorders  . ADD / ADHD Daughter   . Cancer Maternal Grandmother     colon/ smoker  . Cancer Maternal Grandfather     lung  . Cancer Paternal Grandmother     Breast  . Heart disease Paternal Grandfather   . Stroke Paternal Grandfather   . ADD / ADHD Daughter     Social History   Social History  . Marital status: Married    Spouse name: N/A  . Number of children: N/A  . Years of education: N/A   Occupational History  . Not on file.   Social History Main Topics  . Smoking status: Never Smoker  . Smokeless tobacco: Never Used  . Alcohol use  Yes     Comment: special occasion  . Drug use: No  . Sexual activity: Yes    Partners: Male   Other Topics Concern  . Not on file   Social History Narrative  . No narrative on file    Outpatient Medications Prior to Visit  Medication Sig Dispense Refill  . Biotin 10 MG CAPS Take 1 tablet by mouth daily.    . calcium-vitamin D (OSCAL WITH D) 500-200 MG-UNIT tablet Take 1 tablet by mouth daily.    Marland Kitchen L-LYSINE PO Take by mouth.    . Melatonin 3 MG CAPS Take by mouth.    . Omega-3 Krill Oil 1000 MG CAPS Take by mouth daily.    . Probiotic Product (PROBIOTIC DAILY PO) Take 1 capsule by mouth daily.    . sertraline (ZOLOFT) 100 MG tablet Take 100 mg by mouth daily.    . vitamin B-12 (CYANOCOBALAMIN) 1000 MCG tablet Take 1,000 mcg by mouth daily.      Marland Kitchen zinc gluconate 50 MG tablet Take 50 mg by mouth daily.    Marland Kitchen amoxicillin-clavulanate (AUGMENTIN) 875-125 MG tablet Take 1  tablet by mouth 2 (two) times daily. (Patient not taking: Reported on 01/30/2016) 28 tablet 0  . azelastine (ASTELIN) 0.1 % nasal spray Place 2 sprays into both nostrils 2 (two) times daily. Use in each nostril as directed (Patient not taking: Reported on 01/30/2016) 30 mL 2  . fluconazole (DIFLUCAN) 150 MG tablet Take 1 tablet (150 mg total) by mouth once a week. (Patient not taking: Reported on 01/30/2016) 2 tablet 0   Facility-Administered Medications Prior to Visit  Medication Dose Route Frequency Provider Last Rate Last Dose  . 0.9 %  sodium chloride infusion  500 mL Intravenous Continuous Sable Feil, MD        Allergies  Allergen Reactions  . Itraconazole     REACTION: Rash  . Levaquin [Levofloxacin In D5w]     Joint pain    Review of Systems  Constitutional: Negative for fever and malaise/fatigue.  HENT: Negative for congestion.   Eyes: Negative for blurred vision.  Respiratory: Negative for cough and shortness of breath.   Cardiovascular: Negative for chest pain, palpitations and leg swelling.  Gastrointestinal: Negative for vomiting.  Musculoskeletal: Negative for back pain.  Skin: Negative for rash.  Neurological: Negative for loss of consciousness and headaches.       Objective:    Physical Exam  Constitutional: She is oriented to person, place, and time. She appears well-developed and well-nourished. No distress.  HENT:  Head: Normocephalic and atraumatic.  Eyes: Conjunctivae are normal.  Neck: Normal range of motion. No thyromegaly present.  Cardiovascular: Normal rate and regular rhythm.   Pulmonary/Chest: Effort normal and breath sounds normal. She has no wheezes.  Abdominal: Soft. Bowel sounds are normal. There is no tenderness.  Musculoskeletal: She exhibits no edema or deformity.  Neurological: She is alert and oriented to person, place, and time.  Skin: Skin is warm and dry. She is not diaphoretic.  Psychiatric: She has a normal mood and affect.     BP 108/64 (BP Location: Left Arm, Patient Position: Sitting, Cuff Size: Normal)   Pulse 69   Temp 98.2 F (36.8 C) (Oral)   Ht 5\' 2"  (1.575 m)   Wt 128 lb 12.8 oz (58.4 kg)   SpO2 99% Comment: RA  BMI 23.56 kg/m  Wt Readings from Last 3 Encounters:  01/30/16 128 lb 12.8 oz (58.4 kg)  01/06/16  128 lb 3.2 oz (58.2 kg)  02/09/15 127 lb 9.6 oz (57.9 kg)     Lab Results  Component Value Date   WBC 7.2 01/30/2016   HGB 12.8 01/30/2016   HCT 38.8 01/30/2016   PLT 241.0 01/30/2016   GLUCOSE 83 01/30/2016   CHOL 132 01/30/2016   TRIG 95.0 01/30/2016   HDL 67.70 01/30/2016   LDLCALC 45 01/30/2016   ALT 37 (H) 01/30/2016   AST 34 01/30/2016   NA 142 01/30/2016   K 3.6 01/30/2016   CL 104 01/30/2016   CREATININE 0.70 01/30/2016   BUN 14 01/30/2016   CO2 32 01/30/2016   TSH 1.33 01/30/2016   INR 1.1 02/25/2007   HGBA1C 6.0 10/10/2011    Lab Results  Component Value Date   TSH 1.33 01/30/2016   Lab Results  Component Value Date   WBC 7.2 01/30/2016   HGB 12.8 01/30/2016   HCT 38.8 01/30/2016   MCV 89.5 01/30/2016   PLT 241.0 01/30/2016   Lab Results  Component Value Date   NA 142 01/30/2016   K 3.6 01/30/2016   CO2 32 01/30/2016   GLUCOSE 83 01/30/2016   BUN 14 01/30/2016   CREATININE 0.70 01/30/2016   BILITOT 0.3 01/30/2016   ALKPHOS 84 01/30/2016   AST 34 01/30/2016   ALT 37 (H) 01/30/2016   PROT 6.5 01/30/2016   ALBUMIN 4.3 01/30/2016   CALCIUM 9.2 01/30/2016   GFR 90.93 01/30/2016   Lab Results  Component Value Date   CHOL 132 01/30/2016   Lab Results  Component Value Date   HDL 67.70 01/30/2016   Lab Results  Component Value Date   LDLCALC 45 01/30/2016   Lab Results  Component Value Date   TRIG 95.0 01/30/2016   Lab Results  Component Value Date   CHOLHDL 2 01/30/2016   Lab Results  Component Value Date   HGBA1C 6.0 10/10/2011       Assessment & Plan:   Problem List Items Addressed This Visit    Anemia    Increase leafy  greens, consider increased lean red meat and using cast iron cookware. Continue to monitor, report any concerns      Relevant Orders   CBC (Completed)   Comprehensive metabolic panel (Completed)   Post-menopause    Had a hysterectomy years ago for fibroids and excessive bleeding      Preventative health care    Patient encouraged to maintain heart healthy diet, regular exercise, adequate sleep. Consider daily probiotics. Take medications as prescribed. Given and reviewed copy of ACP documents from [employer] and encouraged to complete and return      Relevant Orders   CBC (Completed)   Comprehensive metabolic panel (Completed)   Lipid panel (Completed)   TSH (Completed)   Vitamin B12 (Completed)   VITAMIN D 25 Hydroxy (Vit-D Deficiency, Fractures) (Completed)   Folate (Completed)   History of gestational diabetes    Was always diet controlled during second pregnancy      H/O gastric bypass    Will check a vitamin 123456, Vitamin D, folic acid levels      Relevant Orders   Vitamin B12 (Completed)   VITAMIN D 25 Hydroxy (Vit-D Deficiency, Fractures) (Completed)   Folate (Completed)   Dry eyes, bilateral    Has drops from opthamology but has not been using regular encouraged to use daily for a month and report to eye doctor if watery eyes persist  Other Visit Diagnoses    Breast cancer screening    -  Primary   Relevant Orders   MM SCREENING BREAST TOMO BILATERAL (Completed)      I have discontinued Ms. Prickett's azelastine, amoxicillin-clavulanate, and fluconazole. I am also having her maintain her vitamin B-12, Biotin, Probiotic Product (PROBIOTIC DAILY PO), L-LYSINE PO, sertraline, zinc gluconate, Omega-3 Krill Oil, calcium-vitamin D, and Melatonin. We will continue to administer sodium chloride.  No orders of the defined types were placed in this encounter.   CMA served as Education administrator during this visit. History, Physical and Plan performed by medical  provider. Documentation and orders reviewed and attested to.  Penni Homans, MD

## 2016-01-30 NOTE — Progress Notes (Signed)
Pre visit review using our clinic review tool, if applicable. No additional management support is needed unless otherwise documented below in the visit note. 

## 2016-01-30 NOTE — Progress Notes (Signed)
Patient ID: Jennifer Sandoval, female   DOB: 1955-12-08, 61 y.o.   MRN: [id number]

## 2016-01-30 NOTE — Assessment & Plan Note (Signed)
Was always diet controlled during second pregnancy

## 2016-01-30 NOTE — Patient Instructions (Addendum)
Received Tdap vaccination today. Spoke with you about Advanced Directives. Encouraged Patient to give Dr. Frederik Pear Office a copy of Healthcare Power of [place] and Living Will. Preventive Care 40-64 Years, Female Preventive care refers to lifestyle choices and visits with your health care provider that can promote health and wellness. What does preventive care include?  A yearly physical exam. This is also called an annual well check.  Dental exams once or twice a year.  Routine eye exams. Ask your health care provider how often you should have your eyes checked.  Personal lifestyle choices, including:  Daily care of your teeth and gums.  Regular physical activity.  Eating a healthy diet.  Avoiding tobacco and drug use.  Limiting alcohol use.  Practicing safe sex.  Taking low-dose aspirin daily starting at age 29.  Taking vitamin and mineral supplements as recommended by your health care provider. What happens during an annual well check? The services and screenings done by your health care provider during your annual well check will depend on your age, overall health, lifestyle risk factors, and family history of disease. Counseling  Your health care provider may ask you questions about your:  Alcohol use.  Tobacco use.  Drug use.  Emotional well-being.  Home and relationship well-being.  Sexual activity.  Eating habits.  Work and work Statistician.  Method of birth control.  Menstrual cycle.  Pregnancy history. Screening  You may have the following tests or measurements:  Height, weight, and BMI.  Blood pressure.  Lipid and cholesterol levels. These may be checked every 5 years, or more frequently if you are over 79 years old.  Skin check.  Lung cancer screening. You may have this screening every year starting at age 30 if you have a 30-pack-year history of smoking and currently smoke or have quit within the past 15 years.  Fecal occult blood test  (FOBT) of the stool. You may have this test every year starting at age 23.  Flexible sigmoidoscopy or colonoscopy. You may have a sigmoidoscopy every 5 years or a colonoscopy every 10 years starting at age 81.  Hepatitis C blood test.  Hepatitis B blood test.  Sexually transmitted disease (STD) testing.  Diabetes screening. This is done by checking your blood sugar (glucose) after you have not eaten for a while (fasting). You may have this done every 1-3 years.  Mammogram. This may be done every 1-2 years. Talk to your health care provider about when you should start having regular mammograms. This may depend on whether you have a family history of breast cancer.  BRCA-related cancer screening. This may be done if you have a family history of breast, ovarian, tubal, or peritoneal cancers.  Pelvic exam and Pap test. This may be done every 3 years starting at age 9. Starting at age 92, this may be done every 5 years if you have a Pap test in combination with an HPV test.  Bone density scan. This is done to screen for osteoporosis. You may have this scan if you are at high risk for osteoporosis. Discuss your test results, treatment options, and if necessary, the need for more tests with your health care provider. Vaccines  Your health care provider may recommend certain vaccines, such as:  Influenza vaccine. This is recommended every year.  Tetanus, diphtheria, and acellular pertussis (Tdap, Td) vaccine. You may need a Td booster every 10 years.  Varicella vaccine. You may need this if you have not been vaccinated.  Zoster vaccine.  You may need this after age 29.  Measles, mumps, and rubella (MMR) vaccine. You may need at least one dose of MMR if you were born in 1957 or later. You may also need a second dose.  Pneumococcal 13-valent conjugate (PCV13) vaccine. You may need this if you have certain conditions and were not previously vaccinated.  Pneumococcal polysaccharide (PPSV23)  vaccine. You may need one or two doses if you smoke cigarettes or if you have certain conditions.  Meningococcal vaccine. You may need this if you have certain conditions.  Hepatitis A vaccine. You may need this if you have certain conditions or if you travel or work in places where you may be exposed to hepatitis A.  Hepatitis B vaccine. You may need this if you have certain conditions or if you travel or work in places where you may be exposed to hepatitis B.  Haemophilus influenzae type b (Hib) vaccine. You may need this if you have certain conditions. Talk to your health care provider about which screenings and vaccines you need and how often you need them. This information is not intended to replace advice given to you by your health care provider. Make sure you discuss any questions you have with your health care provider. Document Released: 01/14/2015 Document Revised: 09/07/2015 Document Reviewed: 10/19/2014 Elsevier Interactive Patient Education  2017 [employer].

## 2016-01-30 NOTE — Assessment & Plan Note (Signed)
Had a hysterectomy years ago for fibroids and excessive bleeding

## 2016-01-30 NOTE — Assessment & Plan Note (Signed)
Will check a vitamin 123456, Vitamin D, folic acid levels

## 2016-02-13 ENCOUNTER — Telehealth: Payer: Self-pay | Admitting: Family Medicine

## 2016-02-13 MED ORDER — AMOXICILLIN-POT CLAVULANATE 875-125 MG PO TABS: 1.0000 | ORAL_TABLET | Freq: Two times a day (BID) | ORAL | 0 refills | Status: DC

## 2016-02-13 NOTE — Telephone Encounter (Signed)
Diflucan 150 mg tab, 1 tab po q week x 2 weeks. Disp #2 with 1 rf

## 2016-02-13 NOTE — Telephone Encounter (Signed)
I did just see her and she does have one or two sinus infections each year. Am willing to write Augmentin 875 tab, 1 tab po bid x 14 days. If no improvement will need to come in for evaluation

## 2016-02-13 NOTE — Telephone Encounter (Signed)
Caller name: Relationship to patient: Self Can be reached: [place]:  [id number] - [place], [place] [phone] (Phone) [phone] (Fax)     Reason for call: Patient states she has another sinus infection and needs an antibiotic called in for her. States that Dr. Charlett Blake is aware that she frequently has these infections and always calls her in an antibiotic

## 2016-02-13 NOTE — Telephone Encounter (Signed)
Patient informed of PCP instructions.  Sent in prescription. She also forgot but needs something for yeast please.

## 2016-02-14 MED ORDER — FLUCONAZOLE 150 MG PO TABS: ORAL_TABLET | ORAL | 0 refills | Status: DC

## 2016-02-14 NOTE — Telephone Encounter (Signed)
Diflucan sent into pharmacy. Called the patient informed sent in.

## 2016-04-09 ENCOUNTER — Other Ambulatory Visit: Payer: Self-pay | Admitting: Family Medicine

## 2016-05-23 ENCOUNTER — Ambulatory Visit (INDEPENDENT_AMBULATORY_CARE_PROVIDER_SITE_OTHER): Payer: Managed Care, Other (non HMO) | Admitting: Medical

## 2016-05-23 ENCOUNTER — Encounter: Payer: Self-pay | Admitting: Medical

## 2016-05-23 VITALS — BP 113/83 | HR 80 | Temp 98.1°F | Resp 16 | Ht 62.0 in | Wt 127.8 lb

## 2016-05-23 DIAGNOSIS — J019 Acute sinusitis, unspecified: Secondary | ICD-10-CM | POA: Diagnosis not present

## 2016-05-23 DIAGNOSIS — B9689 Other specified bacterial agents as the cause of diseases classified elsewhere: Secondary | ICD-10-CM | POA: Diagnosis not present

## 2016-05-23 MED ORDER — BENZONATATE 100 MG PO CAPS: 100.0000 mg | ORAL_CAPSULE | Freq: Three times a day (TID) | ORAL | 0 refills | Status: DC | PRN

## 2016-05-23 MED ORDER — FLUCONAZOLE 150 MG PO TABS: 150.0000 mg | ORAL_TABLET | Freq: Once | ORAL | 0 refills | Status: AC

## 2016-05-23 MED ORDER — AZELASTINE HCL 0.1 % NA SOLN: 2.0000 | Freq: Two times a day (BID) | NASAL | 1 refills | Status: DC

## 2016-05-23 MED ORDER — AMOXICILLIN-POT CLAVULANATE 875-125 MG PO TABS: 1.0000 | ORAL_TABLET | Freq: Two times a day (BID) | ORAL | 0 refills | Status: DC

## 2016-05-23 NOTE — Progress Notes (Signed)
Subjective:    Patient ID: Jennifer Sandoval, female    DOB: 02/09/1955, 61 y.o.   MRN: [id number]  HPI Pt in for some recent nasal congestion for 2 months. She has struggle with allergies. Pt states last 3 weeks sinus pain frontal and maxillary. In morning when she blows nose will get colored mucous.   Maybe 10 days ago had mild fever.   Hx of recurrent sinus infection.  She has used mucinex recently. Recently tried allegra and zyrtec recently. Now using zyrtec.    Review of Systems  Constitutional: Positive for fever. Negative for chills and fatigue.       10 days ago fever.  HENT: Positive for congestion, sinus pain and sinus pressure. Negative for mouth sores, postnasal drip, sneezing and sore throat.   Respiratory: Positive for cough. Negative for chest tightness, shortness of breath and wheezing.        Mild cough  Cardiovascular: Negative for chest pain and palpitations.  Genitourinary: Negative for dysuria, flank pain and frequency.  Musculoskeletal: Negative for back pain.  Skin: Negative for rash.  Neurological: Negative for dizziness, seizures, speech difficulty, weakness and headaches.  Hematological: Negative for adenopathy. Does not bruise/bleed easily.  Psychiatric/Behavioral: Negative for behavioral problems and confusion. The patient is not nervous/anxious.    Past Medical History:  Diagnosis Date  . Acute sinusitis 07/31/2006  . ALLERGIC RHINITIS 08/15/2006  . Allergy    rhinitis  . Anemia    iron deficeincy  . ANEMIA-NOS 10/24/2006  . BASAL CELL CARCINOMA SKIN LOWER LIMB INCL HIP 02/15/2010  . BCC (basal cell carcinoma of skin) 07/10/2011   h/o  . Depression   . DEPRESSION 10/24/2006  . 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 Dry eyes, bilateral 01/30/2016  . Dyspepsia 04/19/2010  . FRACTURE, NOSE 10/22/2007  . History of gestational diabetes 01/30/2016  . Insomnia 01/23/2012  . Leiomyoma of uterus, unspecified 02/15/2010  . Lumbar compression fracture  ([place])    nonsurgical  . OSTEOARTHRITIS 10/24/2006  . PERIMENOPAUSAL STATUS 03/16/2010  . Post-menopause 03/16/2010   Qualifier: Diagnosis of  By: Charlett Blake MD, Erline Levine    . Preventative health care 08/31/2012  . SHOULDER PAIN, BILATERAL 10/10/2007     Social History   Social History  . Marital status: Married    Spouse name: N/A  . Number of children: N/A  . Years of education: N/A   Occupational History  . Not on file.   Social History Main Topics  . Smoking status: Never Smoker  . Smokeless tobacco: Never Used  . Alcohol use Yes     Comment: special occasion  . Drug use: No  . Sexual activity: Yes    Partners: Male   Other Topics Concern  . Not on file   Social History Narrative  . No narrative on file    Past Surgical History:  Procedure Laterality Date  . ABDOMINAL HYSTERECTOMY  2009   partial, ovaries left in place, for painful, heavy menstrrual bleeding secondary to anemia and fibroids  . APPENDECTOMY    . CESAREAN SECTION     X 3  . CHOLECYSTECTOMY    . GASTRIC BYPASS    . NASAL SINUS SURGERY    . SEPTOPLASTY    . skin biopsy     L hip BCC, facial lesions were benign  . TUBAL LIGATION    . ventral herniorrhaphy     X 2    Family History  Problem Relation Age of Onset  .  Cholelithiasis Mother   . Other Mother        Urinary incontince/ bladder prolapse/ h/o smoking  . Cancer Mother        laryngeal carcinoma  . Allergies Mother   . Arthritis Mother        s/p knee and shoulder replacement  . Dementia Mother        lewy body  . Other Father        Renal failure  . Hypertension Father   . Coronary artery disease Father        s/p bypass  . Aortic aneurysm Father   . Hyperlipidemia Father   . Heart disease Father 101       quadruple bypass  . Spina bifida Brother        with stunt/ self cath  . Seizures Brother        disorders  . ADD / ADHD Daughter   . Cancer Maternal Grandmother        colon/ smoker  . Cancer Maternal Grandfather         lung  . Cancer Paternal Grandmother        Breast  . Heart disease Paternal Grandfather   . Stroke Paternal Grandfather   . ADD / ADHD Daughter     Allergies  Allergen Reactions  . Itraconazole     REACTION: Rash  . Levaquin [Levofloxacin In D5w]     Joint pain    Current Outpatient Prescriptions on File Prior to Visit  Medication Sig Dispense Refill  . Biotin 10 MG CAPS Take 1 tablet by mouth daily.    . calcium-vitamin D (OSCAL WITH D) 500-200 MG-UNIT tablet Take 1 tablet by mouth daily.    . fluconazole (DIFLUCAN) 150 MG tablet 1 tab po q week x 2 weeks. 2 tablet 0  . L-LYSINE PO Take by mouth.    . Melatonin 3 MG CAPS Take by mouth.    . Omega-3 Krill Oil 1000 MG CAPS Take by mouth daily.    . Probiotic Product (PROBIOTIC DAILY PO) Take 1 capsule by mouth daily.    . sertraline (ZOLOFT) 100 MG tablet Take 100 mg by mouth daily.    . vitamin B-12 (CYANOCOBALAMIN) 1000 MCG tablet Take 1,000 mcg by mouth daily.      Marland Kitchen zinc gluconate 50 MG tablet Take 50 mg by mouth daily.     Current Facility-Administered Medications on File Prior to Visit  Medication Dose Route Frequency Provider Last Rate Last Dose  . 0.9 %  sodium chloride infusion  500 mL Intravenous Continuous Sable Feil, MD        BP 113/83 (BP Location: Left Arm, Patient Position: Sitting, Cuff Size: Normal)   Pulse 80   Temp 98.1 F (36.7 C) (Oral)   Resp 16   Ht 5\' 2"  (1.575 m)   Wt 127 lb 12.8 oz (58 kg)   SpO2 99%   BMI 23.37 kg/m       Objective:   Physical Exam  General  Mental Status - Alert. General Appearance - Well groomed. Not in acute distress.  Skin Rashes- No Rashes.  HEENT Head- Normal. Ear Auditory Canal - Left- Normal. Right - Normal.Tympanic Membrane- Left- Normal. Right- Normal. Eye Sclera/Conjunctiva- Left- Normal. Right- Normal. Nose & Sinuses Nasal Mucosa- Left-  Boggy and Congested. Right-  Boggy and  Congested.Bilateral maxillary and frontal sinus pressure. Mouth &  Throat Lips: Upper Lip- Normal: no dryness, cracking, pallor, cyanosis, or  vesicular eruption. Lower Lip-Normal: no dryness, cracking, pallor, cyanosis or vesicular eruption. Buccal Mucosa- Bilateral- No Aphthous ulcers. Oropharynx- No Discharge or Erythema. Tonsils: Characteristics- Bilateral- No Erythema or Congestion. Size/Enlargement- Bilateral- No enlargement. Discharge- bilateral-None.  Neck Neck- Supple. No Masses.   Chest and Lung Exam Auscultation: Breath Sounds:-Clear even and unlabored.  Cardiovascular Auscultation:Rythm- Regular, rate and rhythm. Murmurs & Other Heart Sounds:Ausculatation of the heart reveal- No Murmurs.  Lymphatic Head & Neck General Head & Neck Lymphatics: Bilateral: Description- No Localized lymphadenopathy.       Assessment & Plan:  You  appear to have a sinus infection. I am prescribing  augmentin antibiotic for the infection. To help with the nasal congestion I prescribed astelin. For your associated cough use your benzonatate  Extended augmentin to 14 days due to history as we discussed  Diflucan rx in event of yeast infection.  Rest, hydrate, tylenol for fever.  Follow up in 7 days or as needed.

## 2016-05-23 NOTE — Patient Instructions (Addendum)
You  appear to have a sinus infection. I am prescribing  augmentin antibiotic for the infection. To help with the nasal congestion I prescribed astelin. For your associated cough use your benzonatate  Extended augmentin to 14 days due to history as we discussed  Diflucan rx in event yeast infection.  Rest, hydrate, tylenol for fever.

## 2016-06-06 ENCOUNTER — Telehealth: Payer: Self-pay | Admitting: Family Medicine

## 2016-06-06 NOTE — Telephone Encounter (Signed)
Pt states taking last augmentin dose tomorrow. Pt states sinus infection symptoms have not changed she is requesting another round of antibiotics or a different one. Please call pt and advise.

## 2016-06-07 NOTE — Telephone Encounter (Signed)
Patient called back to find out if provider is going to send in another antibiotic. Plse adv.

## 2016-06-07 NOTE — Telephone Encounter (Signed)
It would be tough for me to rx another abx without having evaluated her. I would recommend continuing her supportive care and start using an intranasal corticosteroid in addition to the Astelin spray. The fact that she did not improve on the Augmentin suggests that it may be more related to another etiology that is not bacterial. Will defer to Dr. Charlett Blake if she decides to call in an additional abx. TY.

## 2016-06-08 ENCOUNTER — Ambulatory Visit (INDEPENDENT_AMBULATORY_CARE_PROVIDER_SITE_OTHER): Payer: Managed Care, Other (non HMO) | Admitting: Family Medicine

## 2016-06-08 ENCOUNTER — Encounter: Payer: Self-pay | Admitting: Family Medicine

## 2016-06-08 VITALS — BP 112/81 | HR 75 | Temp 98.7°F | Resp 16 | Wt 130.0 lb

## 2016-06-08 DIAGNOSIS — J0110 Acute frontal sinusitis, unspecified: Secondary | ICD-10-CM | POA: Diagnosis not present

## 2016-06-08 MED ORDER — DOXYCYCLINE HYCLATE 100 MG PO TABS: 100.0000 mg | ORAL_TABLET | Freq: Two times a day (BID) | ORAL | 0 refills | Status: AC

## 2016-06-08 MED ORDER — FLUCONAZOLE 150 MG PO TABS: 150.0000 mg | ORAL_TABLET | Freq: Once | ORAL | 0 refills | Status: AC

## 2016-06-08 NOTE — Progress Notes (Signed)
Jennifer Sandoval , 05-31-1955, 61 y.o., female MRN: [id number] Patient Care Team    Relationship Specialty Notifications Start End  Mosie Lukes, MD PCP - General Family Medicine  02/15/10    Comment: Merged    Chief Complaint  Patient presents with  . Sinusitis    pressure behind eyes     Subjective: pt presents for acute OV for recurrent sinus infection. She was seen a little over 2 weeks by another provider and treated with Augmentin, Astelin, tessalon perles. She reports she has also used mucinex and zyrtec. She has frequent sinus infections and has had to undergo sinus surgery about 25 years ago. She initially was feeling improvement on the Augmentin until the end of the course the sinus pain and pressure started reoccurring, and cough and congestion developed. She denies fever, chills, nausea, vomit or diarrhea. She reports she frequently experiences yeast infections with abx use and is requesting diflucan.    No flowsheet data found.  Allergies  Allergen Reactions  . Itraconazole     REACTION: Rash  . Levaquin [Levofloxacin In D5w]     Joint pain   Social History  Substance Use Topics  . Smoking status: Never Smoker  . Smokeless tobacco: Never Used  . Alcohol use Yes     Comment: special occasion   Past Medical History:  Diagnosis Date  . Acute sinusitis 07/31/2006  . ALLERGIC RHINITIS 08/15/2006  . Allergy    rhinitis  . Anemia    iron deficeincy  . ANEMIA-NOS 10/24/2006  . BASAL CELL CARCINOMA SKIN LOWER LIMB INCL HIP 02/15/2010  . BCC (basal cell carcinoma of skin) 07/10/2011   h/o  . Depression   . DEPRESSION 10/24/2006  . 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 Dry eyes, bilateral 01/30/2016  . Dyspepsia 04/19/2010  . FRACTURE, NOSE 10/22/2007  . History of gestational diabetes 01/30/2016  . Insomnia 01/23/2012  . Leiomyoma of uterus, unspecified 02/15/2010  . Lumbar compression fracture ([place])    nonsurgical  . OSTEOARTHRITIS 10/24/2006  .  PERIMENOPAUSAL STATUS 03/16/2010  . Post-menopause 03/16/2010   Qualifier: Diagnosis of  By: Charlett Blake MD, Erline Levine    . Preventative health care 08/31/2012  . SHOULDER PAIN, BILATERAL 10/10/2007   Past Surgical History:  Procedure Laterality Date  . ABDOMINAL HYSTERECTOMY  2009   partial, ovaries left in place, for painful, heavy menstrrual bleeding secondary to anemia and fibroids  . APPENDECTOMY    . CESAREAN SECTION     X 3  . CHOLECYSTECTOMY    . GASTRIC BYPASS    . NASAL SINUS SURGERY    . SEPTOPLASTY    . skin biopsy     L hip BCC, facial lesions were benign  . TUBAL LIGATION    . ventral herniorrhaphy     X 2   Family History  Problem Relation Age of Onset  . Cholelithiasis Mother   . Other Mother        Urinary incontince/ bladder prolapse/ h/o smoking  . Cancer Mother        laryngeal carcinoma  . Allergies Mother   . Arthritis Mother        s/p knee and shoulder replacement  . Dementia Mother        lewy body  . Other Father        Renal failure  . Hypertension Father   . Coronary artery disease Father        s/p bypass  . Aortic aneurysm  Father   . Hyperlipidemia Father   . Heart disease Father 70       quadruple bypass  . Spina bifida Brother        with stunt/ self cath  . Seizures Brother        disorders  . ADD / ADHD Daughter   . Cancer Maternal Grandmother        colon/ smoker  . Cancer Maternal Grandfather        lung  . Cancer Paternal Grandmother        Breast  . Heart disease Paternal Grandfather   . Stroke Paternal Grandfather   . ADD / ADHD Daughter    Allergies as of 06/08/2016      Reactions   Itraconazole    REACTION: Rash   Levaquin [levofloxacin In D5w]    Joint pain      Medication List       Accurate as of 06/08/16  1:55 PM. Always use your most recent med list.          azelastine 0.1 % nasal spray Commonly known as:  ASTELIN Place 2 sprays into both nostrils 2 (two) times daily. Use in each nostril as directed     benzonatate 100 MG capsule Commonly known as:  TESSALON Take 1 capsule (100 mg total) by mouth 3 (three) times daily as needed for cough.   Biotin 10 MG Caps Take 1 tablet by mouth daily.   calcium-vitamin D 500-200 MG-UNIT tablet Commonly known as:  OSCAL WITH D Take 1 tablet by mouth daily.   doxycycline 100 MG tablet Commonly known as:  VIBRA-TABS Take 1 tablet (100 mg total) by mouth 2 (two) times daily.   L-LYSINE PO Take by mouth.   Melatonin 3 MG Caps Take by mouth.   Omega-3 Krill Oil 1000 MG Caps Take by mouth daily.   PROBIOTIC DAILY PO Take 1 capsule by mouth daily.   sertraline 100 MG tablet Commonly known as:  ZOLOFT Take 100 mg by mouth daily.   vitamin B-12 1000 MCG tablet Commonly known as:  CYANOCOBALAMIN Take 1,000 mcg by mouth daily.   zinc gluconate 50 MG tablet Take 50 mg by mouth daily.       All past medical history, surgical history, allergies, family history, immunizations andmedications were updated in the EMR today and reviewed under the history and medication portions of their EMR.     ROS: Negative, with the exception of above mentioned in HPI   Objective:  BP 112/81 (BP Location: Left Arm, Patient Position: Sitting, Cuff Size: Normal)   Pulse 75   Temp 98.7 F (37.1 C) (Oral)   Resp 16   Wt 130 lb (59 kg)   SpO2 99%   BMI 23.78 kg/m  Body mass index is 23.78 kg/m. Gen: Afebrile. No acute distress. Nontoxic in appearance, well developed, well nourished.  HENT: AT. [place]. Bilateral TM visualized with erythema or fullness. MMM, no oral lesions. Bilateral nares without erythema or swelling, mild drainage present. Throat without erythema or exudates. Cough and hoarseness present. TTP frontal sinus.  Eyes:Pupils Equal Round Reactive to light, Extraocular movements intact,  Conjunctiva without redness, discharge or icterus. Neck/lymp/endocrine: Supple,mild right ant cervical  lymphadenopathy CV: RRR Chest: CTAB, no wheeze or  crackles. Good air movement, normal resp effort.  Skin: no rashes, purpura or petechiae.  Neuro:  Normal gait. PERLA. EOMi. Alert. Oriented x3   No exam data present No results found. No results found for  this or any previous visit (from the past 24 hour(s)).  Assessment/Plan: Jennifer Sandoval is a 61 y.o. female present for OV for  1. Acute frontal sinusitis, recurrence not specified Rest, hydrate.  +/-  mucinex (DM if cough), nettie pot or nasal saline. Daily antihistamine of choice.  Doxy BID x 14d prescribed, take until completed. Extended course 2/2 to history.  If cough present it can last up to 6-8 weeks.  - diflucan prescribed F/U 2 weeks of not improved. May need to consider ENT referral if continues to occur.    Reviewed expectations re: course of current medical issues.  Discussed self-management of symptoms.  Outlined signs and symptoms indicating need for more acute intervention.  Patient verbalized understanding and all questions were answered.  Patient received an After-Visit Summary.   Note is dictated utilizing voice recognition software. Although note has been proof read prior to signing, occasional typographical errors still can be missed. If any questions arise, please do not hesitate to call for verification.   electronically signed by:  Howard Pouch, DO  [place]

## 2016-06-08 NOTE — Patient Instructions (Addendum)
It was nice to meet you today. I hope you feel better soon.  Doxycyline every 12 hours for 14 days.  Nasal saline flushes daily.  Mucinex DM.  Daily antihistamine.   Rest and hydrate.   If continues to return, may need to consider an ENT referral.    Sinusitis, Adult Sinusitis is soreness and inflammation of your sinuses. Sinuses are hollow spaces in the bones around your face. They are located:  Around your eyes.  In the middle of your forehead.  Behind your nose.  In your cheekbones.  Your sinuses and nasal passages are lined with a stringy fluid (mucus). Mucus normally drains out of your sinuses. When your nasal tissues get inflamed or swollen, the mucus can get trapped or blocked so air cannot flow through your sinuses. This lets bacteria, viruses, and funguses grow, and that leads to infection. Follow these instructions at home: Medicines  Take, use, or apply over-the-counter and prescription medicines only as told by your doctor. These may include nasal sprays.  If you were prescribed an antibiotic medicine, take it as told by your doctor. Do not stop taking the antibiotic even if you start to feel better. Hydrate and Humidify  Drink enough water to keep your pee (urine) clear or pale yellow.  Use a cool mist humidifier to keep the humidity level in your home above 50%.  Breathe in steam for 10-15 minutes, 3-4 times a day or as told by your doctor. You can do this in the bathroom while a hot shower is running.  Try not to spend time in cool or dry air. Rest  Rest as much as possible.  Sleep with your head raised (elevated).  Make sure to get enough sleep each night. General instructions  Put a warm, moist washcloth on your face 3-4 times a day or as told by your doctor. This will help with discomfort.  Wash your hands often with soap and water. If there is no soap and water, use hand sanitizer.  Do not smoke. Avoid being around people who are smoking  (secondhand smoke).  Keep all follow-up visits as told by your doctor. This is important. Contact a doctor if:  You have a fever.  Your symptoms get worse.  Your symptoms do not get better within 10 days. Get help right away if:  You have a very bad headache.  You cannot stop throwing up (vomiting).  You have pain or swelling around your face or eyes.  You have trouble seeing.  You feel confused.  Your neck is stiff.  You have trouble breathing. This information is not intended to replace advice given to you by your health care provider. Make sure you discuss any questions you have with your health care provider. Document Released: 06/06/2007 Document Revised: 08/14/2015 Document Reviewed: 10/13/2014 Elsevier Interactive Patient Education  [employer].

## 2016-06-12 NOTE — Telephone Encounter (Signed)
Patient was seen in the [facility] office  On Friday and was prescribed an antibiotic

## 2016-06-12 NOTE — Telephone Encounter (Signed)
Please advise   Pc 

## 2016-06-12 NOTE — Telephone Encounter (Signed)
She can have a course of Doxycycline 100 mg tabs, 1 tab po bid x 10 days. If no resolution needs to come in

## 2016-07-04 ENCOUNTER — Other Ambulatory Visit: Payer: Self-pay | Admitting: Family Medicine

## 2016-07-31 ENCOUNTER — Ambulatory Visit: Payer: Managed Care, Other (non HMO) | Admitting: Family Medicine

## 2016-07-31 ENCOUNTER — Telehealth: Payer: Self-pay | Admitting: Family Medicine

## 2016-07-31 DIAGNOSIS — Z0289 Encounter for other administrative examinations: Secondary | ICD-10-CM

## 2016-07-31 NOTE — Telephone Encounter (Signed)
No charge, but next time she does have to give [place] at least 24 hours notice so we can fill slot

## 2016-07-31 NOTE — Telephone Encounter (Signed)
Patient states she was advised if she didn't need 6 month follow up appointment scheduled for today she could cancel, charge or no charge

## 2016-07-31 NOTE — Telephone Encounter (Signed)
LVM advising patient of message below °

## 2016-08-13 ENCOUNTER — Ambulatory Visit (INDEPENDENT_AMBULATORY_CARE_PROVIDER_SITE_OTHER): Payer: Managed Care, Other (non HMO) | Admitting: Medical

## 2016-08-13 ENCOUNTER — Encounter: Payer: Self-pay | Admitting: Medical

## 2016-08-13 VITALS — BP 123/88 | HR 72 | Temp 98.5°F | Resp 16 | Ht 62.0 in | Wt 127.0 lb

## 2016-08-13 DIAGNOSIS — R040 Epistaxis: Secondary | ICD-10-CM | POA: Diagnosis not present

## 2016-08-13 DIAGNOSIS — J3489 Other specified disorders of nose and nasal sinuses: Secondary | ICD-10-CM

## 2016-08-13 DIAGNOSIS — J0100 Acute maxillary sinusitis, unspecified: Secondary | ICD-10-CM | POA: Diagnosis not present

## 2016-08-13 MED ORDER — DOXYCYCLINE HYCLATE 100 MG PO TABS: 100.0000 mg | ORAL_TABLET | Freq: Two times a day (BID) | ORAL | 0 refills | Status: DC

## 2016-08-13 NOTE — Patient Instructions (Addendum)
You appear to have possible sinus infection. I am prescribing doxycycline antibiotic for the infection.    Refer to ENT for small nose lesion  If you get nose bleed again can use afrin as you have already tried. But if severe nose bleeds let me know as can try to expidite the ENT referral.  Rest, hydrate, tylenol for fever.  Follow up in 7 days or as needed.

## 2016-08-13 NOTE — Progress Notes (Signed)
Subjective:    Patient ID: Jennifer Sandoval, female    DOB: [birth year], 61 y.o.   MRN: [id number]  HPI  Pt in with some recent sinus pressure x 5 days. She states  Sinus pressure symptoms with some nose bleeds from both rt nares and some left nares as well.   Symptom since Thursday she has daily nose bleeds.Nose bleeds were worst first 2 days. Pt did try some afrin and it seemed to help. Yesterday nose bleed was minimal.  Pt denies any high blood pressure.  Pt states occasional sinus infection/chronic sinus issues in the past.  Hx of sinus surgery 26 hears ago.  No hx of htn. And normal platelets in last year.  She notes small scab lesion rt side of nares. States medial aspect describes as in septum area.   Review of Systems  Constitutional: Negative for chills, fatigue and fever.  HENT: Positive for congestion, nosebleeds, sinus pain and sinus pressure. Negative for drooling, ear pain, hearing loss, postnasal drip, sore throat and tinnitus.   Respiratory: Negative for cough, chest tightness and shortness of breath.   Cardiovascular: Negative for chest pain and palpitations.  Gastrointestinal: Negative for abdominal pain and blood in stool.  Musculoskeletal: Negative for back pain.  Skin: Negative for rash.  Neurological: Negative for dizziness, seizures, syncope, weakness and numbness.  Hematological: Negative for adenopathy. Does not bruise/bleed easily.  Psychiatric/Behavioral: Negative for behavioral problems, decreased concentration, dysphoric mood, self-injury and suicidal ideas. The patient is not nervous/anxious.      Past Medical History:  Diagnosis Date  . Acute sinusitis 07/31/2006  . ALLERGIC RHINITIS 08/15/2006  . Allergy    rhinitis  . Anemia    iron deficeincy  . ANEMIA-NOS 10/24/2006  . BASAL CELL CARCINOMA SKIN LOWER LIMB INCL HIP 02/15/2010  . BCC (basal cell carcinoma of skin) 07/10/2011   h/o  . Depression   . DEPRESSION 10/24/2006  . Disorder of  phosphorus metabolism 07/10/2011  . Dry eyes, bilateral 01/30/2016  . Dyspepsia 04/19/2010  . FRACTURE, NOSE 10/22/2007  . History of gestational diabetes 01/30/2016  . Insomnia 01/23/2012  . Leiomyoma of uterus, unspecified 02/15/2010  . Lumbar compression fracture ([place])    nonsurgical  . OSTEOARTHRITIS 10/24/2006  . PERIMENOPAUSAL STATUS 03/16/2010  . Post-menopause 03/16/2010   Qualifier: Diagnosis of  By: Charlett Blake MD, Erline Levine    . Preventative health care 08/31/2012  . SHOULDER PAIN, BILATERAL 10/10/2007     Social History   Social History  . Marital status: Married    Spouse name: N/A  . Number of children: N/A  . Years of education: N/A   Occupational History  . Not on file.   Social History Main Topics  . Smoking status: Never Smoker  . Smokeless tobacco: Never Used  . Alcohol use Yes     Comment: special occasion  . Drug use: No  . Sexual activity: Yes    Partners: Male   Other Topics Concern  . Not on file   Social History Narrative  . No narrative on file    Past Surgical History:  Procedure Laterality Date  . ABDOMINAL HYSTERECTOMY  2009   partial, ovaries left in place, for painful, heavy menstrrual bleeding secondary to anemia and fibroids  . APPENDECTOMY    . CESAREAN SECTION     X 3  . CHOLECYSTECTOMY    . GASTRIC BYPASS    . NASAL SINUS SURGERY    . SEPTOPLASTY    . skin biopsy  L hip BCC, facial lesions were benign  . TUBAL LIGATION    . ventral herniorrhaphy     X 2    Family History  Problem Relation Age of Onset  . Cholelithiasis Mother   . Other Mother        Urinary incontince/ bladder prolapse/ h/o smoking  . Cancer Mother        laryngeal carcinoma  . Allergies Mother   . Arthritis Mother        s/p knee and shoulder replacement  . Dementia Mother        lewy body  . Other Father        Renal failure  . Hypertension Father   . Coronary artery disease Father        s/p bypass  . Aortic aneurysm Father   . Hyperlipidemia Father    . Heart disease Father 28       quadruple bypass  . Spina bifida Brother        with stunt/ self cath  . Seizures Brother        disorders  . ADD / ADHD Daughter   . Cancer Maternal Grandmother        colon/ smoker  . Cancer Maternal Grandfather        lung  . Cancer Paternal Grandmother        Breast  . Heart disease Paternal Grandfather   . Stroke Paternal Grandfather   . ADD / ADHD Daughter     Allergies  Allergen Reactions  . Itraconazole     REACTION: Rash  . Levaquin [Levofloxacin In D5w]     Joint pain    Current Outpatient Prescriptions on File Prior to Visit  Medication Sig Dispense Refill  . azelastine (ASTELIN) 0.1 % nasal spray Place 2 sprays into both nostrils 2 (two) times daily. Use in each nostril as directed 30 mL 1  . benzonatate (TESSALON) 100 MG capsule Take 1 capsule (100 mg total) by mouth 3 (three) times daily as needed for cough. 21 capsule 0  . Biotin 10 MG CAPS Take 1 tablet by mouth daily.    . calcium-vitamin D (OSCAL WITH D) 500-200 MG-UNIT tablet Take 1 tablet by mouth daily.    Marland Kitchen L-LYSINE PO Take by mouth.    . Melatonin 3 MG CAPS Take by mouth.    . Omega-3 Krill Oil 1000 MG CAPS Take by mouth daily.    . Probiotic Product (PROBIOTIC DAILY PO) Take 1 capsule by mouth daily.    . sertraline (ZOLOFT) 100 MG tablet Take 100 mg by mouth daily.    . sertraline (ZOLOFT) 100 MG tablet TAKE 1 AND 1/2 TABLETS (150 MG TOTAL) BY MOUTH DAILY 135 tablet 0  . vitamin B-12 (CYANOCOBALAMIN) 1000 MCG tablet Take 1,000 mcg by mouth daily.      Marland Kitchen zinc gluconate 50 MG tablet Take 50 mg by mouth daily.     Current Facility-Administered Medications on File Prior to Visit  Medication Dose Route Frequency Provider Last Rate Last Dose  . 0.9 %  sodium chloride infusion  500 mL Intravenous Continuous Sable Feil, MD        BP 123/88   Pulse 72   Temp 98.5 F (36.9 C) (Oral)   Resp 16   Ht 5\' 2"  (1.575 m)   Wt 127 lb (57.6 kg)   SpO2 100%   BMI  23.23 kg/m       Objective:   Physical  Exam  General  Mental Status - Alert. General Appearance - Well groomed. Not in acute distress.  Skin Rashes- No Rashes.  HEENT Head- Normal. Ear Auditory Canal - Left- Normal. Right - Normal.Tympanic Membrane- Left- Normal. Right- Normal. Eye Sclera/Conjunctiva- Left- Normal. Right- Normal. Nose & Sinuses Nasal Mucosa- Left-  Boggy and Congested. Right-  Boggy and  Congested.Bilateral rt side  maxillary and rt frontal sinus pressure.  On inspection of nares rt side. Septal area small scab present. Not bleeding and no discharge.  Mouth & Throat Lips: Upper Lip- Normal: no dryness, cracking, pallor, cyanosis, or vesicular eruption. Lower Lip-Normal: no dryness, cracking, pallor, cyanosis or vesicular eruption. Buccal Mucosa- Bilateral- No Aphthous ulcers. Oropharynx- No Discharge or Erythema. Tonsils: Characteristics- Bilateral- No Erythema or Congestion. Size/Enlargement- Bilateral- No enlargement. Discharge- bilateral-None.  Neck Neck- Supple. No Masses.   Chest and Lung Exam Auscultation: Breath Sounds:-Clear even and unlabored.  Cardiovascular Auscultation:Rythm- Regular, rate and rhythm. Murmurs & Other Heart Sounds:Ausculatation of the heart reveal- No Murmurs.  Lymphatic Head & Neck General Head & Neck Lymphatics: Bilateral: Description- No Localized lymphadenopathy.       Assessment & Plan:  You appear to have possible sinus infection. I am prescribing doxycycline antibiotic for the infection.    Refer to ENT for small nose lesion  If you get nose bleed again can use afrin as you have already tried. But if severe nose bleeds let me know as can try to expidite the ENT referral.  Rest, hydrate, tylenol for fever.  Follow up in 7 days or as needed.  Ruperto Kiernan, [name], PA-C

## 2016-10-01 ENCOUNTER — Other Ambulatory Visit: Payer: Self-pay | Admitting: Family Medicine

## 2016-10-01 NOTE — Telephone Encounter (Signed)
Requesting:zoloft Contract:no UDS:no/ Last OV:08/13/16 Next OV:not scheduled  Last Refill:07/05/16   #[id number] Sig: TAKE 1 AND 1/2 TABLETS (150 MG TOTAL) BY MOUTH DAILY           Please advise  /

## 2016-10-01 NOTE — Telephone Encounter (Signed)
Medication sent to pharmacy  

## 2016-10-12 ENCOUNTER — Other Ambulatory Visit: Payer: Self-pay

## 2016-10-12 MED ORDER — SERTRALINE HCL 100 MG PO TABS: ORAL_TABLET | ORAL | 0 refills | Status: DC

## 2016-11-01 ENCOUNTER — Emergency Department (HOSPITAL_COMMUNITY): Payer: Managed Care, Other (non HMO)

## 2016-11-01 ENCOUNTER — Encounter (HOSPITAL_COMMUNITY): Payer: Self-pay | Admitting: Emergency Medicine

## 2016-11-01 ENCOUNTER — Emergency Department (HOSPITAL_COMMUNITY)
Admission: EM | Admit: 2016-11-01 | Discharge: 2016-11-01 | Disposition: A | Discharge status: Home / Self Care | Payer: Managed Care, Other (non HMO) | Attending: Emergency Medicine | Admitting: Emergency Medicine

## 2016-11-01 DIAGNOSIS — Y9389 Activity, other specified: Secondary | ICD-10-CM | POA: Diagnosis not present

## 2016-11-01 DIAGNOSIS — S2222XA Fracture of body of sternum, initial encounter for closed fracture: Secondary | ICD-10-CM | POA: Diagnosis not present

## 2016-11-01 DIAGNOSIS — D649 Anemia, unspecified: Secondary | ICD-10-CM | POA: Insufficient documentation

## 2016-11-01 DIAGNOSIS — S8001XA Contusion of right knee, initial encounter: Secondary | ICD-10-CM

## 2016-11-01 DIAGNOSIS — Z79899 Other long term (current) drug therapy: Secondary | ICD-10-CM | POA: Insufficient documentation

## 2016-11-01 DIAGNOSIS — S8002XA Contusion of left knee, initial encounter: Secondary | ICD-10-CM | POA: Diagnosis not present

## 2016-11-01 DIAGNOSIS — R51 Headache: Secondary | ICD-10-CM | POA: Diagnosis not present

## 2016-11-01 DIAGNOSIS — Y929 Unspecified place or not applicable: Secondary | ICD-10-CM | POA: Insufficient documentation

## 2016-11-01 DIAGNOSIS — S50811A Abrasion of right forearm, initial encounter: Secondary | ICD-10-CM

## 2016-11-01 DIAGNOSIS — Y999 Unspecified external cause status: Secondary | ICD-10-CM | POA: Insufficient documentation

## 2016-11-01 DIAGNOSIS — S299XXA Unspecified injury of thorax, initial encounter: Secondary | ICD-10-CM | POA: Diagnosis present

## 2016-11-01 LAB — BASIC METABOLIC PANEL
ANION GAP: 8 (ref 5–15)
BUN: 12 mg/dL (ref 6–20)
CO2: 29 mmol/L (ref 22–32)
Calcium: 9.2 mg/dL (ref 8.9–10.3)
Chloride: 102 mmol/L (ref 101–111)
Creatinine, Ser: 0.81 mg/dL (ref 0.44–1.00)
GFR calc Af Amer: >60 mL/min (ref >60)
GLUCOSE: 94 mg/dL (ref 65–99)
POTASSIUM: 3.9 mmol/L (ref 3.5–5.1)
Sodium: 139 mmol/L (ref 135–145)

## 2016-11-01 LAB — CBC
HCT: 39 % (ref 36.0–46.0)
Hemoglobin: 12.5 g/dL (ref 12.0–15.0)
MCH: 28.7 pg (ref 26.0–34.0)
MCHC: 32.1 g/dL (ref 30.0–36.0)
MCV: 89.7 fL (ref 78.0–100.0)
PLATELETS: 235 10*3/uL (ref 150–400)
RBC: 4.35 MIL/uL (ref 3.87–5.11)
RDW: 13.5 % (ref 11.5–15.5)
WBC: 9.4 10*3/uL (ref 4.0–10.5)

## 2016-11-01 LAB — I-STAT TROPONIN, ED: Troponin i, poc: 0 ng/mL (ref 0.00–0.08)

## 2016-11-01 MED ORDER — IOPAMIDOL (ISOVUE-300) INJECTION 61%
INTRAVENOUS | Status: AC
  Administered 2016-11-01: 75 mL
  Filled 2016-11-01: qty 75

## 2016-11-01 MED ORDER — ONDANSETRON HCL 4 MG/2ML IJ SOLN
4.0000 mg | Freq: Once | INTRAMUSCULAR | Status: AC
  Administered 2016-11-01: 4 mg via INTRAVENOUS
  Filled 2016-11-01: qty 2

## 2016-11-01 MED ORDER — OXYCODONE-ACETAMINOPHEN 5-325 MG PO TABS
1.0000 | ORAL_TABLET | Freq: Once | ORAL | Status: AC
  Administered 2016-11-01: 1 via ORAL
  Filled 2016-11-01: qty 1

## 2016-11-01 MED ORDER — OXYCODONE-ACETAMINOPHEN 5-325 MG PO TABS: 1.0000 | ORAL_TABLET | ORAL | 0 refills | Status: DC | PRN

## 2016-11-01 MED ORDER — FENTANYL CITRATE (PF) 100 MCG/2ML IJ SOLN
50.0000 ug | Freq: Once | INTRAMUSCULAR | Status: AC
  Administered 2016-11-01: 50 ug via INTRAVENOUS
  Filled 2016-11-01: qty 2
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
## 2016-11-01 NOTE — ED Notes (Signed)
Pt completely undressed and gown applied; pt remains in c-collar

## 2016-11-01 NOTE — ED Notes (Signed)
Ray, MD at bedside reviewing results
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
## 2016-12-06 ENCOUNTER — Encounter: Payer: Self-pay | Admitting: Medical

## 2016-12-06 ENCOUNTER — Ambulatory Visit: Payer: Managed Care, Other (non HMO) | Admitting: Medical

## 2016-12-06 VITALS — BP 122/86 | HR 75 | Temp 98.1°F | Resp 16 | Ht 62.0 in | Wt 124.6 lb

## 2016-12-06 DIAGNOSIS — J3489 Other specified disorders of nose and nasal sinuses: Secondary | ICD-10-CM | POA: Diagnosis not present

## 2016-12-06 DIAGNOSIS — R0789 Other chest pain: Secondary | ICD-10-CM

## 2016-12-06 DIAGNOSIS — M546 Pain in thoracic spine: Secondary | ICD-10-CM

## 2016-12-06 DIAGNOSIS — S2222XD Fracture of body of sternum, subsequent encounter for fracture with routine healing: Secondary | ICD-10-CM | POA: Diagnosis not present

## 2016-12-06 MED ORDER — DOXYCYCLINE HYCLATE 100 MG PO TABS: 100.0000 mg | ORAL_TABLET | Freq: Two times a day (BID) | ORAL | 0 refills | Status: DC

## 2016-12-06 MED ORDER — KETOROLAC TROMETHAMINE 60 MG/2ML IM SOLN
60.0000 mg | Freq: Once | INTRAMUSCULAR | Status: AC
  Administered 2016-12-06: 60 mg via INTRAMUSCULAR

## 2016-12-06 MED ORDER — MELOXICAM 7.5 MG PO TABS
7.5000 mg | ORAL_TABLET | Freq: Every day | ORAL | 0 refills | Status: DC
Start: 2016-12-06 — End: 2017-02-25
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
## 2016-12-06 NOTE — Patient Instructions (Addendum)
For your history of sternum fracture and some mild persisting pain, we gave you Toradol 60 mg in office today.  And you can start meloxicam 7.5 mg tomorrow afternoon.   The pain does appear to be musculoskeletal in nature as you have pain reproducible on palpation and on movement with upper extremities.  However we did do a EKG to be safe today.  If you have any worsening chest pain or associated cardiac type signs and symptoms and be seen in the emergency department.  Some mild upper back pain, I think this is likely muscular pain and do not see the need for x-rays presently.  For your history of sinus infections and recent worse pressure, I will treat for recurrent sinus infection.  He can use Flonase nasal spray for congestion.  Use your benzonatate for associated cough and I will am prescribing doxycycline antibiotic.  Follow-up in 10 days or as needed.

## 2017-01-21 ENCOUNTER — Encounter: Payer: Self-pay | Admitting: Medical

## 2017-01-21 ENCOUNTER — Other Ambulatory Visit: Payer: Self-pay | Admitting: Medical

## 2017-01-21 MED ORDER — DOXYCYCLINE HYCLATE 100 MG PO TABS: 100.0000 mg | ORAL_TABLET | Freq: Two times a day (BID) | ORAL | 0 refills | Status: DC

## 2017-01-22 ENCOUNTER — Encounter: Payer: Self-pay | Admitting: Medical

## 2017-01-25 ENCOUNTER — Encounter: Payer: Self-pay | Admitting: Medical

## 2017-01-25 ENCOUNTER — Ambulatory Visit: Payer: Managed Care, Other (non HMO) | Admitting: Medical

## 2017-01-25 VITALS — BP 120/78 | HR 98 | Temp 98.1°F | Ht 62.0 in | Wt 124.2 lb

## 2017-01-25 DIAGNOSIS — J0100 Acute maxillary sinusitis, unspecified: Secondary | ICD-10-CM | POA: Diagnosis not present

## 2017-01-25 DIAGNOSIS — F419 Anxiety disorder, unspecified: Secondary | ICD-10-CM

## 2017-01-25 DIAGNOSIS — G4700 Insomnia, unspecified: Secondary | ICD-10-CM | POA: Diagnosis not present

## 2017-01-25 MED ORDER — FLUCONAZOLE 150 MG PO TABS: ORAL_TABLET | ORAL | 0 refills | Status: DC

## 2017-01-25 MED ORDER — ALPRAZOLAM 0.5 MG PO TABS: 0.5000 mg | ORAL_TABLET | Freq: Every evening | ORAL | 0 refills | Status: DC | PRN

## 2017-01-25 NOTE — Patient Instructions (Addendum)
Your sinus pressure has decreased only after 4 days of doxycycline.  So I feel confident that after full 10-day course this should resolve sinus infection.  If not please let us know.  For anxiety and insomnia, I am printing a short course of Xanax.  You can use it up to twice daily but recommend using as sparingly as possible.  If in the future you feel that you need more frequent prescriptions of Xanax then we could discuss with Dr. Charlett Blake and initiate contract.  If future anxiety is not that severe keep in mind there are other options such as hydroxyzine which I mentioned.  For possible yeast infection associated with recent antibiotic use I prescribed Diflucan.  Follow-up of regular schedule with PCP or as needed.

## 2017-01-25 NOTE — Progress Notes (Signed)
Pre visit review using our clinic review tool, if applicable. No additional management support is needed unless otherwise documented below in the visit note. 

## 2017-01-25 NOTE — Progress Notes (Signed)
Subjective:    Patient ID: Jennifer Sandoval, female    DOB: [birth year], 62 y.o.   MRN: [id number]  HPI  Pt in states she is feeling better. On day 4 doxycycline. Pt states she is taking probiotics. She states  had sinus congestion, some green nasal drainage before she my charting me and before I prescribe doxycycline.  When I got the my chart message I agreed to rx doxycycline but asked her to come iin on Friday. She is in for follow-up prior to the weekend.  Pt also wants diflucan for possible yeast infection.  Recently on xanax in past. Recent high level stress and anxiety related to some issues with her daughter. Pt is seeing counselor but not sleeping. She wants brief rx of xanax for anxiety and insomnia. Not reporting depression.   Review of Systems  Constitutional: Negative for chills, fatigue and fever.  HENT: Positive for congestion and sinus pressure. Negative for ear pain, facial swelling, sinus pain and sore throat.   Respiratory: Negative for cough, choking, shortness of breath and wheezing.   Cardiovascular: Negative for chest pain and palpitations.  Gastrointestinal: Negative for abdominal pain.  Genitourinary: Negative for dyspareunia.  Musculoskeletal: Negative for back pain.  Neurological: Negative for dizziness, speech difficulty, weakness, numbness and headaches.  Hematological: Negative for adenopathy. Does not bruise/bleed easily.  Psychiatric/Behavioral: Positive for sleep disturbance. Negative for agitation, behavioral problems, hallucinations and suicidal ideas. The patient is nervous/anxious.    Past Medical History:  Diagnosis Date  . Acute sinusitis 07/31/2006  . ALLERGIC RHINITIS 08/15/2006  . Allergy    rhinitis  . Anemia    iron deficeincy  . ANEMIA-NOS 10/24/2006  . BASAL CELL CARCINOMA SKIN LOWER LIMB INCL HIP 02/15/2010  . BCC (basal cell carcinoma of skin) 07/10/2011   h/o  . Depression   . DEPRESSION 10/24/2006  . Disorder of phosphorus metabolism  07/10/2011  . Dry eyes, bilateral 01/30/2016  . Dyspepsia 04/19/2010  . FRACTURE, NOSE 10/22/2007  . History of gestational diabetes 01/30/2016  . Insomnia 01/23/2012  . Leiomyoma of uterus, unspecified 02/15/2010  . Lumbar compression fracture ([place])    nonsurgical  . OSTEOARTHRITIS 10/24/2006  . PERIMENOPAUSAL STATUS 03/16/2010  . Post-menopause 03/16/2010   Qualifier: Diagnosis of  By: Charlett Blake MD, Erline Levine    . Preventative health care 08/31/2012  . SHOULDER PAIN, BILATERAL 10/10/2007     Social History   Socioeconomic History  . Marital status: Married    Spouse name: Not on file  . Number of children: Not on file  . Years of education: Not on file  . Highest education level: Not on file  Social Needs  . Financial resource strain: Not on file  . Food insecurity - worry: Not on file  . Food insecurity - inability: Not on file  . Transportation needs - medical: Not on file  . Transportation needs - non-medical: Not on file  Occupational History  . Not on file  Tobacco Use  . Smoking status: Never Smoker  . Smokeless tobacco: Never Used  Substance and Sexual Activity  . Alcohol use: Yes    Comment: special occasion  . Drug use: No  . Sexual activity: Yes    Partners: Male  Other Topics Concern  . Not on file  Social History Narrative  . Not on file    Past Surgical History:  Procedure Laterality Date  . ABDOMINAL HYSTERECTOMY  2009   partial, ovaries left in place, for painful, heavy menstrrual bleeding secondary  to anemia and fibroids  . APPENDECTOMY    . CESAREAN SECTION     X 3  . CHOLECYSTECTOMY    . GASTRIC BYPASS    . NASAL SINUS SURGERY    . SEPTOPLASTY    . skin biopsy     L hip BCC, facial lesions were benign  . TUBAL LIGATION    . ventral herniorrhaphy     X 2    Family History  Problem Relation Age of Onset  . Cholelithiasis Mother   . Other Mother        Urinary incontince/ bladder prolapse/ h/o smoking  . Cancer Mother        laryngeal carcinoma    . Allergies Mother   . Arthritis Mother        s/p knee and shoulder replacement  . Dementia Mother        lewy body  . Other Father        Renal failure  . Hypertension Father   . Coronary artery disease Father        s/p bypass  . Aortic aneurysm Father   . Hyperlipidemia Father   . Heart disease Father 7       quadruple bypass  . Spina bifida Brother        with stunt/ self cath  . Seizures Brother        disorders  . ADD / ADHD Daughter   . Cancer Maternal Grandmother        colon/ smoker  . Cancer Maternal Grandfather        lung  . Cancer Paternal Grandmother        Breast  . Heart disease Paternal Grandfather   . Stroke Paternal Grandfather   . ADD / ADHD Daughter     Allergies  Allergen Reactions  . Itraconazole     REACTION: Rash  . Levaquin [Levofloxacin In D5w]     Joint pain    Current Outpatient Medications on File Prior to Visit  Medication Sig Dispense Refill  . azelastine (ASTELIN) 0.1 % nasal spray Place 2 sprays into both nostrils 2 (two) times daily. Use in each nostril as directed 30 mL 1  . benzonatate (TESSALON) 100 MG capsule Take 1 capsule (100 mg total) by mouth 3 (three) times daily as needed for cough. 21 capsule 0  . Biotin 10 MG CAPS Take 1 tablet by mouth daily.    . calcium-vitamin D (OSCAL WITH D) 500-200 MG-UNIT tablet Take 1 tablet by mouth daily.    Marland Kitchen doxycycline (VIBRA-TABS) 100 MG tablet Take 1 tablet (100 mg total) by mouth 2 (two) times daily. 20 tablet 0  . L-LYSINE PO Take by mouth.    . Melatonin 3 MG CAPS Take by mouth.    . meloxicam (MOBIC) 7.5 MG tablet Take 1 tablet (7.5 mg total) by mouth daily. 14 tablet 0  . Omega-3 Krill Oil 1000 MG CAPS Take by mouth daily.    Marland Kitchen oxyCODONE-acetaminophen (PERCOCET/ROXICET) 5-325 MG tablet Take 1 tablet by mouth every 4 (four) hours as needed for severe pain. 15 tablet 0  . Probiotic Product (PROBIOTIC DAILY PO) Take 1 capsule by mouth daily.    . sertraline (ZOLOFT) 100 MG tablet  TAKE 1 AND 1/2 TABLETS (150 MG TOTAL) BY MOUTH DAILY (Patient taking differently: Take 100 mg by mouth daily. ) 135 tablet 0  . vitamin B-12 (CYANOCOBALAMIN) 1000 MCG tablet Take 1,000 mcg by mouth daily.      Marland Kitchen  zinc gluconate 50 MG tablet Take 50 mg by mouth daily.     Current Facility-Administered Medications on File Prior to Visit  Medication Dose Route Frequency Provider Last Rate Last Dose  . 0.9 %  sodium chloride infusion  500 mL Intravenous Continuous Sable Feil, MD        BP 120/78 (BP Location: Left Arm, Patient Position: Sitting, Cuff Size: Normal)   Pulse 98   Temp 98.1 F (36.7 C) (Oral)   Ht 5\' 2"  (1.575 m)   Wt 124 lb 4 oz (56.4 kg)   SpO2 99%   BMI 22.73 kg/m      Objective:   Physical Exam  General Mental Status- Alert. General Appearance- Not in acute distress.   Skin General: Color- Normal Color. Moisture- Normal Moisture.  Neck Carotid Arteries- Normal color. Moisture- Normal Moisture. No carotid bruits. No JVD.  Chest and Lung Exam Auscultation: Breath Sounds:-Normal.  Cardiovascular Auscultation:Rythm- Regular. Murmurs & Other Heart Sounds:Auscultation of the heart reveals- No Murmurs.   Inspection:-Inspeection Normal. Palpation/Percussion:Note:No mass. Palpation and Percussion of the abdomen reveal- Non Tender, Non Distended + BS, no rebound or guarding.    Neurologic Cranial Nerve exam:- CN III-XII intact(No nystagmus), symmetric smile. Strength:- 5/5 equal and symmetric strength both upper and lower extremities.      Assessment & Plan:  Your  sinus pressure has decreased only after 4 days of doxycycline.  So I feel confident that after full 10-day course this should resolve sinus infection.  If not please let us know.  For anxiety and insomnia, I am printing a short course of Xanax.  You can use it up to twice daily but recommend using as sparingly as possible.  If in the future you feel that you need more frequent prescriptions  of Xanax then  we could discuss with Dr. Charlett Blake and initiate contract.  If future anxiety is not that severe keep in mind there are other options such as hydroxyzine which I mentioned.  For possible yeast infection associated with recent antibiotic use I prescribed Diflucan.  Follow-up of regular schedule with PCP or as needed.

## 2017-02-12 ENCOUNTER — Telehealth: Payer: Self-pay | Admitting: Medical

## 2017-02-12 DIAGNOSIS — Z79899 Other long term (current) drug therapy: Secondary | ICD-10-CM

## 2017-02-13 ENCOUNTER — Other Ambulatory Visit: Payer: Self-pay | Admitting: Medical

## 2017-02-13 NOTE — Telephone Encounter (Signed)
Rx request: Alprazolam   Last RX:01/25/2017 Last OV:01/25/2017 Next XK:PVVZ Scheduled  UDS: None on file  SMO:LMBE on file

## 2017-02-13 NOTE — Telephone Encounter (Signed)
Called Ot to advise her to come in and sign Control Contract and have UDS done. Pt stated she didn't "want to be one of those controlled substance people" and she would prefer not to have the refill at all. Rx has been shredded along with contract. Pt can not have Rx without compliance.

## 2017-02-25 ENCOUNTER — Encounter: Payer: Self-pay | Admitting: Medical

## 2017-02-25 ENCOUNTER — Ambulatory Visit: Payer: Managed Care, Other (non HMO) | Admitting: Medical

## 2017-02-25 VITALS — BP 104/88 | HR 72 | Temp 98.2°F | Resp 16 | Ht 62.0 in | Wt 127.2 lb

## 2017-02-25 DIAGNOSIS — H0011 Chalazion right upper eyelid: Secondary | ICD-10-CM

## 2017-02-25 DIAGNOSIS — L0390 Cellulitis, unspecified: Secondary | ICD-10-CM

## 2017-02-25 MED ORDER — MOXIFLOXACIN HCL 0.5 % OP SOLN: 1.0000 [drp] | Freq: Three times a day (TID) | OPHTHALMIC | 0 refills | Status: DC

## 2017-02-25 MED ORDER — CEFTRIAXONE SODIUM 500 MG IJ SOLR
500.0000 mg | Freq: Once | INTRAMUSCULAR | Status: AC
  Administered 2017-02-25: 500 mg via INTRAMUSCULAR

## 2017-02-25 MED FILL — MOXIFLOXACIN 0.5% EYE DROPS: 0.5 | 20 days supply | Qty: 3 | Fill #0

## 2017-02-25 NOTE — Patient Instructions (Signed)
You do appear to have probable chalazion of right upper eyelid.  Possible cellulitis.  It appears some minimal improvement with doxycycline but not adequate after 5 days.  We gave you Rocephin 500 mg IM in office.  Also prescribed Vigamox eyedrops.  The Vigamox was sent downstairs to our pharmacy.  You could discuss with our pharmacy whether or not they consider it safe to use in light of your Levaquin joint pain history.  I have placed referral to ophthalmologist.  I am hoping to get you in by tomorrow or Wednesday.  Please call tomorrow for an update if nobody has called you by 10:30 AM.  Follow-up date to be determined after ophthalmology referral.

## 2017-02-25 NOTE — Progress Notes (Signed)
Subjective:    Patient ID: Jennifer Sandoval, female    DOB: 01-Nov-1955, 62 y.o.   MRN: [id number]  HPI  Pt in with some rt upper eyelid redness and swelling around last Thursday. Pt went to urgent care on Friday and she was given an antibiotic.  Pt was given doxycycline antibiotic and was given ointment. She was given e-mycin ointment. Has been on 5 days of doxycyline.  Some improvement but not much.  Pt states her eye lid has been itching minimally.  Some matting still present in the morning.  Patient feels normal.     Review of Systems  Constitutional: Negative for chills, fatigue and fever.  Eyes: Positive for discharge, redness and itching. Negative for photophobia, pain and visual disturbance.  Respiratory: Negative for choking, shortness of breath and wheezing.   Cardiovascular: Negative for chest pain and palpitations.  Gastrointestinal: Negative for abdominal pain.  Skin:       See HPI  Neurological: Negative for dizziness, facial asymmetry and headaches.  Hematological: Negative for adenopathy. Does not bruise/bleed easily.  Psychiatric/Behavioral: Negative for behavioral problems and confusion. The patient is not nervous/anxious.    Past Medical History:  Diagnosis Date  . Acute sinusitis 07/31/2006  . ALLERGIC RHINITIS 08/15/2006  . Allergy    rhinitis  . Anemia    iron deficeincy  . ANEMIA-NOS 10/24/2006  . BASAL CELL CARCINOMA SKIN LOWER LIMB INCL HIP 02/15/2010  . BCC (basal cell carcinoma of skin) 07/10/2011   h/o  . Depression   . DEPRESSION 10/24/2006  . 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 Dry eyes, bilateral 01/30/2016  . Dyspepsia 04/19/2010  . FRACTURE, NOSE 10/22/2007  . History of gestational diabetes 01/30/2016  . Insomnia 01/23/2012  . Leiomyoma of uterus, unspecified 02/15/2010  . Lumbar compression fracture ([place])    nonsurgical  . OSTEOARTHRITIS 10/24/2006  . PERIMENOPAUSAL STATUS 03/16/2010  . Post-menopause 03/16/2010   Qualifier:  Diagnosis of  By: Charlett Blake MD, Erline Levine    . Preventative health care 08/31/2012  . SHOULDER PAIN, BILATERAL 10/10/2007     Social History   Socioeconomic History  . Marital status: Married    Spouse name: Not on file  . Number of children: Not on file  . Years of education: Not on file  . Highest education level: Not on file  Social Needs  . Financial resource strain: Not on file  . Food insecurity - worry: Not on file  . Food insecurity - inability: Not on file  . Transportation needs - medical: Not on file  . Transportation needs - non-medical: Not on file  Occupational History  . Not on file  Tobacco Use  . Smoking status: Never Smoker  . Smokeless tobacco: Never Used  Substance and Sexual Activity  . Alcohol use: Yes    Comment: special occasion  . Drug use: No  . Sexual activity: Yes    Partners: Male  Other Topics Concern  . Not on file  Social History Narrative  . Not on file    Past Surgical History:  Procedure Laterality Date  . ABDOMINAL HYSTERECTOMY  2009   partial, ovaries left in place, for painful, heavy menstrrual bleeding secondary to anemia and fibroids  . APPENDECTOMY    . CESAREAN SECTION     X 3  . CHOLECYSTECTOMY    . GASTRIC BYPASS    . NASAL SINUS SURGERY    . SEPTOPLASTY    . skin biopsy     L hip  BCC, facial lesions were benign  . TUBAL LIGATION    . ventral herniorrhaphy     X 2    Family History  Problem Relation Age of Onset  . Cholelithiasis Mother   . Other Mother        Urinary incontince/ bladder prolapse/ h/o smoking  . Cancer Mother        laryngeal carcinoma  . Allergies Mother   . Arthritis Mother        s/p knee and shoulder replacement  . Dementia Mother        lewy body  . Other Father        Renal failure  . Hypertension Father   . Coronary artery disease Father        s/p bypass  . Aortic aneurysm Father   . Hyperlipidemia Father   . Heart disease Father 37       quadruple bypass  . Spina bifida Brother         with stunt/ self cath  . Seizures Brother        disorders  . ADD / ADHD Daughter   . Cancer Maternal Grandmother        colon/ smoker  . Cancer Maternal Grandfather        lung  . Cancer Paternal Grandmother        Breast  . Heart disease Paternal Grandfather   . Stroke Paternal Grandfather   . ADD / ADHD Daughter     Allergies  Allergen Reactions  . Itraconazole     REACTION: Rash  . Levaquin [Levofloxacin In D5w]     Joint pain    Current Outpatient Medications on File Prior to Visit  Medication Sig Dispense Refill  . Biotin 10 MG CAPS Take 1 tablet by mouth daily.    . calcium-vitamin D (OSCAL WITH D) 500-200 MG-UNIT tablet Take 1 tablet by mouth daily.    Marland Kitchen doxycycline (VIBRA-TABS) 100 MG tablet Take 1 tablet (100 mg total) by mouth 2 (two) times daily. 20 tablet 0  . fluconazole (DIFLUCAN) 150 MG tablet 1 tab po day one and another in 4 days 2 tablet 0  . L-LYSINE PO Take by mouth.    . Melatonin 3 MG CAPS Take by mouth.    . Omega-3 Krill Oil 1000 MG CAPS Take by mouth daily.    . Probiotic Product (PROBIOTIC DAILY PO) Take 1 capsule by mouth daily.    . sertraline (ZOLOFT) 100 MG tablet TAKE 1 AND 1/2 TABLETS (150 MG TOTAL) BY MOUTH DAILY (Patient taking differently: Take 100 mg by mouth daily. ) 135 tablet 0  . zinc gluconate 50 MG tablet Take 50 mg by mouth daily.     Current Facility-Administered Medications on File Prior to Visit  Medication Dose Route Frequency Provider Last Rate Last Dose  . 0.9 %  sodium chloride infusion  500 mL Intravenous Continuous Sable Feil, MD        BP 104/88 (BP Location: Left Arm, Patient Position: Sitting, Cuff Size: Normal)   Pulse 72   Temp 98.2 F (36.8 C) (Oral)   Resp 16   Ht 5\' 2"  (1.575 m)   Wt 127 lb 3.2 oz (57.7 kg)   SpO2 100%   BMI 23.27 kg/m      Objective:   Physical Exam  General  Mental Status - Alert. General Appearance - Well groomed. Not in acute distress.  Skin Rashes- No  Rashes.  HEENT Head-  Normal. Ear Auditory Canal - Left- Normal. Right - Normal.Tympanic Membrane- Left- Normal. Right- Normal. Eye Sclera/Conjunctiva- Left- Normal. Right-mild injected conjunctiva.  No matting or discharge presently.  Right upper eyelid has some diffuse pinkish redness.  The outer area of her lid on palpation and close inspection appears to have to the chalazion. Nose & Sinuses Nasal Mucosa- Left-  Boggy and Congested. Right-  Boggy and  Congested.Bilateral  No maxillary and no  frontal sinus pressure. Mouth & Throat Lips: Upper Lip- Normal: no dryness, cracking, pallor, cyanosis, or vesicular eruption. Lower Lip-Normal: no dryness, cracking, pallor, cyanosis or vesicular eruption. Buccal Mucosa- Bilateral- No Aphthous ulcers. Oropharynx- No Discharge or Erythema. Tonsils: Characteristics- Bilateral- No Erythema or Congestion. Size/Enlargement- Bilateral- No enlargement. Discharge- bilateral-None.  Neck Neck- Supple. No Masses.   Chest and Lung Exam Auscultation: Breath Sounds:-Clear even and unlabored.  Cardiovascular Auscultation:Rythm- Regular, rate and rhythm. Murmurs & Other Heart Sounds:Ausculatation of the heart reveal- No Murmurs.  Lymphatic Head & Neck General Head & Neck Lymphatics: Bilateral: Description- No Localized lymphadenopathy.        Assessment & Plan:  You do appear to have probable chalazion of right upper eyelid.  Possible cellulitis.  It appears some minimal improvement with doxycycline but not adequate after 5 days.  We gave you Rocephin 500 mg IM in office.  Also prescribed Vigamox eyedrops.  The Vigamox was sent downstairs to our pharmacy.  You could discuss with our pharmacy whether or if they  consider it safe to use in light of your Levaquin joint pain history.  I have placed referral to ophthalmologist.  I am hoping to get you in by tomorrow or Wednesday.  Please call tomorrow for an update if nobody has called you by 10:30  AM.  Follow-up date to be determined after ophthalmology referral.  Mackie Pai, PA-C

## 2017-02-25 NOTE — Telephone Encounter (Signed)
Referral to ophthalmologist placed.  Please try to get her in on February 26, 2017 or 27 2019.

## 2017-02-27 ENCOUNTER — Telehealth: Payer: Self-pay

## 2017-02-27 NOTE — Telephone Encounter (Signed)
Copied from [place] [id number]. Topic: Referral - Status >> Feb 26, 2017 11:11 AM Aurelio Brash B wrote: Reason for CRM:Pt states Mackie Pai referred her to ophthalmology and told her if she had not heard from them by 1030  this morning to let office know.  She states she has not heard from ophthalmology office.  >> Feb 27, 2017 11:59 AM Vernona Rieger wrote: [facility] called and stated that the patient called them and stated she needed to be seen there today & that Dr Charlett Blake was suppose to send a referral over to them.  >> Feb 27, 2017 12:00 PM Ahmed Prima L wrote: I made an error. EDWARD SAGUIER was suppose to send it , not [facility]

## 2017-02-27 NOTE — Telephone Encounter (Unsigned)
Copied from [place] [id number]. Topic: Referral - Status >> Feb 26, 2017 11:11 AM Aurelio Brash B wrote: Reason for CRM:Pt states Mackie Pai referred her to ophthalmology and told her if she had not heard from them by 1030  this morning to let office know.  She states she has not heard from ophthalmology office.  >> Feb 27, 2017 11:59 AM Vernona Rieger wrote: [facility] called and stated that the patient called them and stated she needed to be seen there today & that Dr Charlett Blake was suppose to send a referral over to them.  >> Feb 27, 2017 12:00 PM Ahmed Prima L wrote: I made an error. Mackie Pai was suppose to send it , not [facility] >> Feb 27, 2017  5:11 PM Neva Seat wrote: Pt called [facility] today and they called [facility] regarding the referral, but the referral has not been sent.   The [facility] office says they don't have the referral needed for the visit. Pt is very upset and hasn't had a call back as she was promised before the end of workday today.  Please call pt asap to discuss the next step.

## 2017-03-01 NOTE — Telephone Encounter (Signed)
I had wanted her to see an ophthalmologist.  It looks like she had a chalazionon on her upper eyelid.  So would you clarify and see if she saw an ophthalmologist recently.

## 2017-03-01 NOTE — Telephone Encounter (Signed)
Pt was seen today at [facility] eye has a follow up in 3 wks

## 2017-06-21 ENCOUNTER — Other Ambulatory Visit: Payer: Self-pay | Admitting: Family Medicine

## 2017-09-24 ENCOUNTER — Encounter: Payer: Self-pay | Admitting: Medical

## 2017-09-24 ENCOUNTER — Ambulatory Visit: Payer: Managed Care, Other (non HMO) | Admitting: Medical

## 2017-09-24 VITALS — BP 123/85 | HR 66 | Temp 98.3°F | Resp 16 | Ht 62.0 in | Wt 125.3 lb

## 2017-09-24 DIAGNOSIS — J309 Allergic rhinitis, unspecified: Secondary | ICD-10-CM | POA: Diagnosis not present

## 2017-09-24 DIAGNOSIS — R42 Dizziness and giddiness: Secondary | ICD-10-CM

## 2017-09-24 DIAGNOSIS — Z23 Encounter for immunization: Secondary | ICD-10-CM

## 2017-09-24 MED ORDER — LEVOCETIRIZINE DIHYDROCHLORIDE 5 MG PO TABS: 5.0000 mg | ORAL_TABLET | Freq: Every evening | ORAL | 0 refills | Status: DC

## 2017-09-24 NOTE — Progress Notes (Signed)
Subjective:    Patient ID: Jennifer Sandoval, female    DOB: 09/20/1955, 62 y.o.   MRN: [id number]  HPI  Pt in with some dizziness that just started about one week ago. Some on change of positions. When she lays on her left side the symptoms worsen. Pt states 4-5 days in row had vertigo mildly up until last night but none today. No other gross motor or sensory function deficits associated.  Pt feels little nasal congested recently. Pt gets allergies in the fall. Mild pnd. Pt not on antihistamine presently.    Review of Systems  Constitutional: Negative for chills, fatigue and fever.  HENT: Positive for congestion. Negative for drooling, hearing loss, rhinorrhea, sinus pressure and sinus pain.   Respiratory: Negative for chest tightness, shortness of breath and wheezing.   Cardiovascular: Negative for chest pain and palpitations.  Gastrointestinal: Negative for abdominal pain, blood in stool, constipation and diarrhea.  Musculoskeletal: Negative for back pain and joint swelling.  Skin: Negative for rash.  Neurological: Negative for dizziness, seizures, weakness and headaches.       All symptoms resolved now.  Hematological: Negative for adenopathy. Does not bruise/bleed easily.  Psychiatric/Behavioral: Negative for behavioral problems and confusion. The patient is not nervous/anxious.     Past Medical History:  Diagnosis Date  . Acute sinusitis 07/31/2006  . ALLERGIC RHINITIS 08/15/2006  . Allergy    rhinitis  . Anemia    iron deficeincy  . ANEMIA-NOS 10/24/2006  . BASAL CELL CARCINOMA SKIN LOWER LIMB INCL HIP 02/15/2010  . BCC (basal cell carcinoma of skin) 07/10/2011   h/o  . Depression   . DEPRESSION 10/24/2006  . 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 Dry eyes, bilateral 01/30/2016  . Dyspepsia 04/19/2010  . FRACTURE, NOSE 10/22/2007  . History of gestational diabetes 01/30/2016  . Insomnia 01/23/2012  . Leiomyoma of uterus, unspecified 02/15/2010  . Lumbar compression  fracture ([place])    nonsurgical  . OSTEOARTHRITIS 10/24/2006  . PERIMENOPAUSAL STATUS 03/16/2010  . Post-menopause 03/16/2010   Qualifier: Diagnosis of  By: Charlett Blake MD, Erline Levine    . Preventative health care 08/31/2012  . SHOULDER PAIN, BILATERAL 10/10/2007     Social History   Socioeconomic History  . Marital status: Married    Spouse name: Not on file  . Number of children: Not on file  . Years of education: Not on file  . Highest education level: Not on file  Occupational History  . Not on file  Social Needs  . Financial resource strain: Not on file  . Food insecurity:    Worry: Not on file    Inability: Not on file  . Transportation needs:    Medical: Not on file    Non-medical: Not on file  Tobacco Use  . Smoking status: Never Smoker  . Smokeless tobacco: Never Used  Substance and Sexual Activity  . Alcohol use: Yes    Comment: special occasion  . Drug use: No  . Sexual activity: Yes    Partners: Male  Lifestyle  . Physical activity:    Days per week: Not on file    Minutes per session: Not on file  . Stress: Not on file  Relationships  . Social connections:    Talks on phone: Not on file    Gets together: Not on file    Attends religious service: Not on file    Active member of club or organization: Not on file    Attends meetings of  clubs or organizations: Not on file    Relationship status: Not on file  . Intimate partner violence:    Fear of current or ex partner: Not on file    Emotionally abused: Not on file    Physically abused: Not on file    Forced sexual activity: Not on file  Other Topics Concern  . Not on file  Social History Narrative  . Not on file    Past Surgical History:  Procedure Laterality Date  . ABDOMINAL HYSTERECTOMY  2009   partial, ovaries left in place, for painful, heavy menstrrual bleeding secondary to anemia and fibroids  . APPENDECTOMY    . CESAREAN SECTION     X 3  . CHOLECYSTECTOMY    . GASTRIC BYPASS    . NASAL SINUS  SURGERY    . SEPTOPLASTY    . skin biopsy     L hip BCC, facial lesions were benign  . TUBAL LIGATION    . ventral herniorrhaphy     X 2    Family History  Problem Relation Age of Onset  . Cholelithiasis Mother   . Other Mother        Urinary incontince/ bladder prolapse/ h/o smoking  . Cancer Mother        laryngeal carcinoma  . Allergies Mother   . Arthritis Mother        s/p knee and shoulder replacement  . Dementia Mother        lewy body  . Other Father        Renal failure  . Hypertension Father   . Coronary artery disease Father        s/p bypass  . Aortic aneurysm Father   . Hyperlipidemia Father   . Heart disease Father 76       quadruple bypass  . Spina bifida Brother        with stunt/ self cath  . Seizures Brother        disorders  . ADD / ADHD Daughter   . Cancer Maternal Grandmother        colon/ smoker  . Cancer Maternal Grandfather        lung  . Cancer Paternal Grandmother        Breast  . Heart disease Paternal Grandfather   . Stroke Paternal Grandfather   . ADD / ADHD Daughter     Allergies  Allergen Reactions  . Itraconazole     REACTION: Rash  . Levaquin [Levofloxacin In D5w]     Joint pain    Current Outpatient Medications on File Prior to Visit  Medication Sig Dispense Refill  . Biotin 10 MG CAPS Take 1 tablet by mouth daily.    . calcium-vitamin D (OSCAL WITH D) 500-200 MG-UNIT tablet Take 1 tablet by mouth daily.    Marland Kitchen doxycycline (VIBRA-TABS) 100 MG tablet Take 1 tablet (100 mg total) by mouth 2 (two) times daily. 20 tablet 0  . fluconazole (DIFLUCAN) 150 MG tablet 1 tab po day one and another in 4 days 2 tablet 0  . L-LYSINE PO Take by mouth.    . Melatonin 3 MG CAPS Take by mouth.    . moxifloxacin (VIGAMOX) 0.5 % ophthalmic solution Place 1 drop into the right eye 3 (three) times daily. 3 mL 0  . Omega-3 Krill Oil 1000 MG CAPS Take by mouth daily.    . Probiotic Product (PROBIOTIC DAILY PO) Take 1 capsule by mouth daily.      Marland Kitchen  sertraline (ZOLOFT) 100 MG tablet Take 1 tablet (100 mg total) by mouth daily. 90 tablet 1  . zinc gluconate 50 MG tablet Take 50 mg by mouth daily.     Current Facility-Administered Medications on File Prior to Visit  Medication Dose Route Frequency Provider Last Rate Last Dose  . 0.9 %  sodium chloride infusion  500 mL Intravenous Continuous Sable Feil, MD        BP 123/85   Pulse 66   Temp 98.3 F (36.8 C) (Oral)   Resp 16   Ht 5\' 2"  (1.575 m)   Wt 125 lb 4.8 oz (56.8 kg)   SpO2 100%   BMI 22.92 kg/m       Objective:   Physical Exam  General Mental Status- Alert. General Appearance- Not in acute distress.   Skin General: Color- Normal Color. Moisture- Normal Moisture.  Neck Carotid Arteries- Normal color. Moisture- Normal Moisture. No carotid bruits. No JVD.  Chest and Lung Exam Auscultation: Breath Sounds:-Normal.  Cardiovascular Auscultation:Rythm- Regular. Murmurs & Other Heart Sounds:Auscultation of the heart reveals- No Murmurs.  Abdomen Inspection:-Inspeection Normal. Palpation/Percussion:Note:No mass. Palpation and Percussion of the abdomen reveal- Non Tender, Non Distended + BS, no rebound or guarding.   Neurologic Cranial Nerve exam:- CN III-XII intact(No nystagmus), symmetric smile. Drift Test:- No drift. Romberg Exam:- Negative.  Heal to Toe Gait exam:-Normal. Finger to Nose:- Normal/Intact Strength:- 5/5 equal and symmetric strength both upper and lower extremities. On lying supine. Turning head to left and rt. No vertigo.   HEENT Head- Normal. Ear Auditory Canal - Left- Normal. Right - Normal.Tympanic Membrane- Left- Normal. Right- Normal. Eye Sclera/Conjunctiva- Left- Normal. Right- Normal. Nose & Sinuses Nasal Mucosa- Left-  Boggy and Congested. Right-  Boggy and  Congested.Bilateral  Faint maxillary but no frontal sinus pressure. Mouth & Throat Lips: Upper Lip- Normal: no dryness, cracking, pallor, cyanosis, or vesicular  eruption. Lower Lip-Normal: no dryness, cracking, pallor, cyanosis or vesicular eruption. Buccal Mucosa- Bilateral- No Aphthous ulcers. Oropharynx- No Discharge or Erythema. Tonsils: Characteristics- Bilateral- No Erythema or Congestion. Size/Enlargement- Bilateral- No enlargement. Discharge- bilateral-None.           Assessment & Plan:  Your vertigo signs and symptoms appear to have resolved completely.  You also have very good/normal neurologic exam.  Would still advise some caution when you change positions as you report some transient lightheaded sensation.  Make sure you gain your balance before ambulating.  If you do have recurrent vertigo type symptoms when you turn your head as you described then would recommend that you try Epley maneuvers.  We discussed that you could google that and visualize those maneuvers/exercises and then try those.  Also discussed that if you do get dizziness or vertigo with neurologic type signs or symptoms as discussed then you would need to be seen by [place] or emergency department.  In that event you might need stat imaging studies.  You do have history of allergic rhinitis.  No obvious severe symptoms presently but based on history would recommend that you consider going ahead and taking Xyzal to prevent worsening signs/symptoms.  If you develop obvious sinus pressure/pain by me know and I would send an antibiotic.  We gave you a flu vaccine and Tdap today.  Our computer stated that you had TD in 2018.  In light of your upcoming grain children's birth I want to make sure that you had the pertussis component so we did give you Tdap today.  Follow-up as needed  Mackie Pai, PA-C

## 2017-09-24 NOTE — Patient Instructions (Signed)
Your vertigo signs and symptoms appear to have resolved completely.  You also have very good/normal neurologic exam.  Would still advise some caution when you change positions as you report some transient lightheaded sensation.  Make sure you gain your balance before ambulating.  If you do have recurrent vertigo type symptoms when you turn your head as you described then would recommend that you try Epley maneuvers.  We discussed that you could google that and visualize those maneuvers/exercises and then try those.  Also discussed that if you do get dizziness or vertigo with neurologic type signs or symptoms as discussed then you would need to be seen by [place] or emergency department.  In that event you might need stat imaging studies.  You do have history of allergic rhinitis.  No obvious severe symptoms presently but based on history would recommend that you consider going ahead and taking Xyzal to prevent worsening signs/symptoms.  If you develop obvious sinus pressure/pain by me know and I would send an antibiotic.  We gave you a flu vaccine and Tdap today.  Our computer stated that you had TD in 2018.  In light of your upcoming grain children's birth I want to make sure that you had the pertussis component so we did give you Tdap today.  Follow-up as needed

## 2017-11-18 ENCOUNTER — Other Ambulatory Visit: Payer: Self-pay | Admitting: Family Medicine

## 2017-11-23 ENCOUNTER — Other Ambulatory Visit: Payer: Self-pay | Admitting: Family Medicine

## 2017-11-25 MED ORDER — SERTRALINE HCL 100 MG PO TABS: ORAL_TABLET | ORAL | 1 refills | Status: DC

## 2017-11-25 NOTE — Telephone Encounter (Signed)
Spoke with patient to clarify medication  New rx sent in

## 2017-11-25 NOTE — Telephone Encounter (Signed)
Patient is calling for a update in regards to medication change to 1 1/2 a per day.  She stated she only has 1 pill left. Please advise

## 2017-11-25 NOTE — Telephone Encounter (Signed)
Called left message for patient to give me a call back

## 2018-01-14 ENCOUNTER — Ambulatory Visit (HOSPITAL_BASED_OUTPATIENT_CLINIC_OR_DEPARTMENT_OTHER)
Admission: RE | Admit: 2018-01-14 | Discharge: 2018-01-14 | Disposition: A | Discharge status: Home / Self Care | Payer: Managed Care, Other (non HMO) | Source: Ambulatory Visit | Attending: Family Medicine | Admitting: Family Medicine

## 2018-01-14 ENCOUNTER — Ambulatory Visit: Payer: Managed Care, Other (non HMO) | Admitting: Family Medicine

## 2018-01-14 ENCOUNTER — Encounter: Payer: Self-pay | Admitting: Family Medicine

## 2018-01-14 VITALS — BP 100/68 | HR 88 | Temp 99.6°F | Resp 16 | Ht 62.0 in | Wt 123.8 lb

## 2018-01-14 DIAGNOSIS — R6883 Chills (without fever): Secondary | ICD-10-CM | POA: Diagnosis not present

## 2018-01-14 DIAGNOSIS — R059 Cough, unspecified: Secondary | ICD-10-CM

## 2018-01-14 DIAGNOSIS — R05 Cough: Secondary | ICD-10-CM

## 2018-01-14 DIAGNOSIS — J40 Bronchitis, not specified as acute or chronic: Secondary | ICD-10-CM

## 2018-01-14 DIAGNOSIS — J324 Chronic pansinusitis: Secondary | ICD-10-CM | POA: Diagnosis not present

## 2018-01-14 LAB — POC INFLUENZA A&B (BINAX/QUICKVUE)
INFLUENZA B, POC: NEGATIVE
Influenza A, POC: NEGATIVE

## 2018-01-14 MED ORDER — CLARITHROMYCIN ER 500 MG PO TB24: 1000.0000 mg | ORAL_TABLET | Freq: Every day | ORAL | 0 refills | Status: DC

## 2018-01-14 MED ORDER — ALBUTEROL SULFATE (2.5 MG/3ML) 0.083% IN NEBU
2.5000 mg | INHALATION_SOLUTION | Freq: Once | RESPIRATORY_TRACT | Status: AC
  Administered 2018-01-14: 2.5 mg via RESPIRATORY_TRACT

## 2018-01-14 MED ORDER — BECLOMETHASONE DIPROP HFA 40 MCG/ACT IN AERB: 2.0000 | INHALATION_SPRAY | Freq: Two times a day (BID) | RESPIRATORY_TRACT | 2 refills | Status: DC

## 2018-01-14 MED ORDER — CEFTRIAXONE SODIUM 1 G IJ SOLR
1.0000 g | Freq: Once | INTRAMUSCULAR | Status: AC
  Administered 2018-01-14: 1 g via INTRAMUSCULAR

## 2018-01-14 MED ORDER — FLUCONAZOLE 150 MG PO TABS: ORAL_TABLET | ORAL | 0 refills | Status: DC

## 2018-01-14 MED ORDER — CLARITHROMYCIN 500 MG PO TABS: 500.0000 mg | ORAL_TABLET | Freq: Two times a day (BID) | ORAL | 0 refills | Status: DC

## 2018-01-14 MED FILL — FLUCONAZOLE 150 MG TABS: 150 | 2 days supply | Qty: 2 | Fill #0

## 2018-01-14 MED FILL — CLARITHROMYCIN 500 MG TAB: 500 | 10 days supply | Qty: 20 | Fill #0

## 2018-01-14 NOTE — Patient Instructions (Signed)
Acute Bronchitis, Adult Acute bronchitis is when air tubes (bronchi) in the lungs suddenly get swollen. The condition can make it hard to breathe. It can also cause these symptoms:  A cough.  Coughing up clear, yellow, or green mucus.  Wheezing.  Chest congestion.  Shortness of breath.  A fever.  Body aches.  Chills.  A sore throat. Follow these instructions at home:  Medicines  Take over-the-counter and prescription medicines only as told by your doctor.  If you were prescribed an antibiotic medicine, take it as told by your doctor. Do not stop taking the antibiotic even if you start to feel better. General instructions  Rest.  Drink enough fluids to keep your pee (urine) pale yellow.  Avoid smoking and secondhand smoke. If you smoke and you need help quitting, ask your doctor. Quitting will help your lungs heal faster.  Use an inhaler, cool mist vaporizer, or humidifier as told by your doctor.  Keep all follow-up visits as told by your doctor. This is important. How is this prevented? To lower your risk of getting this condition again:  Wash your hands often with soap and water. If you cannot use soap and water, use hand sanitizer.  Avoid contact with people who have cold symptoms.  Try not to touch your hands to your mouth, nose, or eyes.  Make sure to get the flu shot every year. Contact a doctor if:  Your symptoms do not get better in 2 weeks. Get help right away if:  You cough up blood.  You have chest pain.  You have very bad shortness of breath.  You become dehydrated.  You faint (pass out) or keep feeling like you are going to pass out.  You keep throwing up (vomiting).  You have a very bad headache.  Your fever or chills gets worse. This information is not intended to replace advice given to you by your health care provider. Make sure you discuss any questions you have with your health care provider. Document Released: 06/06/2007 Document  Revised: 08/01/2016 Document Reviewed: 06/08/2015 Elsevier Interactive Patient Education  2019 [employer]  
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
## 2018-01-14 NOTE — Progress Notes (Deleted)
poc

## 2018-02-17 ENCOUNTER — Other Ambulatory Visit: Payer: Self-pay | Admitting: Family Medicine

## 2018-05-06 ENCOUNTER — Encounter: Payer: Self-pay | Admitting: Medical

## 2018-05-07 ENCOUNTER — Telehealth: Payer: Self-pay | Admitting: Family Medicine

## 2018-05-07 ENCOUNTER — Other Ambulatory Visit: Payer: Self-pay

## 2018-05-07 ENCOUNTER — Encounter: Payer: Self-pay | Admitting: Medical

## 2018-05-07 ENCOUNTER — Ambulatory Visit (INDEPENDENT_AMBULATORY_CARE_PROVIDER_SITE_OTHER): Payer: Managed Care, Other (non HMO) | Admitting: Medical

## 2018-05-07 DIAGNOSIS — J329 Chronic sinusitis, unspecified: Secondary | ICD-10-CM

## 2018-05-07 DIAGNOSIS — J309 Allergic rhinitis, unspecified: Secondary | ICD-10-CM

## 2018-05-07 MED ORDER — DOXYCYCLINE HYCLATE 100 MG PO TABS: 100.0000 mg | ORAL_TABLET | Freq: Two times a day (BID) | ORAL | 0 refills | Status: DC

## 2018-05-07 MED ORDER — LEVOCETIRIZINE DIHYDROCHLORIDE 5 MG PO TABS: 5.0000 mg | ORAL_TABLET | Freq: Every evening | ORAL | 3 refills | Status: DC

## 2018-05-07 MED ORDER — MONTELUKAST SODIUM 5 MG PO CHEW: CHEWABLE_TABLET | ORAL | 3 refills | Status: DC

## 2018-05-07 NOTE — Telephone Encounter (Signed)
Pt's vm full unable to leave message

## 2018-05-07 NOTE — Patient Instructions (Addendum)
Your appear to have a sinus infection preceded by allergies. I am prescribing antibiotic doxycycline  for the infection.. For allergies rx'd xyzal and montelukast. Pt declined flonase.   If fever or cough notify us. If above symptoms persist despite tx notify us as well.  Follow up in 7 days if symptoms persist or sooner if needed  Note prolonged doxy course give of up to 14 days per pt request and her hx. But if symptoms resolve by 10 days then advise to stop early.

## 2018-05-07 NOTE — Progress Notes (Signed)
   Subjective:    Patient ID: Jennifer Sandoval, female    DOB: [birth year], 63 y.o.   MRN: [id number]  HPI   Virtual Visit via Video Note  I connected with [name] on 05/07/18 at  9:40 AM EDT by a video enabled telemedicine application and verified that I am speaking with the correct person using two identifiers.  Location: Patient: home Provider: home  Virtual visit done due to viral pandemic and recommendation social distance.  Pt did not check vitals. Has no cuff.   I discussed the limitations of evaluation and management by telemedicine and the availability of in person appointments. The patient expressed understanding and agreed to proceed.  History of Present Illness:   Pt states with allergies symptoms for full month nasal congestion, pnd and some sneezing occasional. No fever or cough. She states last couple weeks her sinus pressure. Now some colored mucus when blows her nose.   Observations/Objective: General- no acute distress. Pleasant. Lungs-even unlabored.  Neuro- gross motor function appears intact. heent-(pt reports sinus pressure on palpation)  Assessment and Plan: Your appear to have a sinus infection preceded by allergies. I am prescribing antibiotic doxycycline  for the infection.. For allergies rx'd xyzal and montelukast. Pt declined flonase.   If fever or cough notify us. If above symptoms persist despite tx notify us as well.  Follow up in 7 days if symptoms persist or sooner if needed  Follow Up Instructions:    I discussed the assessment and treatment plan with the patient. The patient was provided an opportunity to ask questions and all were answered. The patient agreed with the plan and demonstrated an understanding of the instructions.   The patient was advised to call back or seek an in-person evaluation if the symptoms worsen or if the condition fails to improve as anticipated.     Mackie Pai, PA-C    Review of Systems   Constitutional: Negative for chills, fatigue and fever.  HENT: Positive for congestion, postnasal drip and sinus pressure. Negative for ear discharge, ear pain and sore throat.   Respiratory: Negative for cough, chest tightness, shortness of breath and wheezing.   Cardiovascular: Negative for chest pain and palpitations.  Gastrointestinal: Negative for abdominal pain.  Skin: Negative for rash.  Neurological: Negative for dizziness and headaches.  Hematological: Negative for adenopathy.  Psychiatric/Behavioral: Negative for behavioral problems and confusion. The patient is not nervous/anxious.        Objective:   Physical Exam        Assessment & Plan:

## 2018-05-22 ENCOUNTER — Ambulatory Visit: Payer: Self-pay | Admitting: *Deleted

## 2018-05-22 NOTE — Telephone Encounter (Signed)
Pt reports intermittent numbness and tingling of right hand, onset 1 month ago. Intermittent, states mostly at fingertips. Denies any pain, no discoloration, normal warmth. Also reports mild swelling of right ankle, occasional vertigo "When lying down, as I go to get up."  After hours call. Pts email and phone number verified. Care advise given. CB# [phone]  Reason for Disposition . [1] Numbness or tingling in one or both hands AND [2] is a chronic symptom (recurrent or ongoing AND present > 4 weeks)  Answer Assessment - Initial Assessment Questions 1. SYMPTOM: "What is the main symptom you are concerned about?" (e.g., weakness, numbness)     Numbness and tingling left hand, fingertips 2. ONSET: "When did this start?" (minutes, hours, days; while sleeping)     1 month ago 3. LAST NORMAL: "When was the last time you were normal (no symptoms)?"    1 month ago 4. PATTERN "Does this come and go, or has it been constant since it started?"  "Is it present now?"     intermittent 5. CARDIAC SYMPTOMS: "Have you had any of the following symptoms: chest pain, difficulty breathing, palpitations?"     no 6. NEUROLOGIC SYMPTOMS: "Have you had any of the following symptoms: headache, dizziness, vision loss, double vision, changes in speech, unsteady on your feet?"    no 7. OTHER SYMPTOMS: "Do you have any other symptoms?"    Swelling in right ankle  Protocols used: NEUROLOGIC DEFICIT-A-AH

## 2018-05-26 ENCOUNTER — Ambulatory Visit (INDEPENDENT_AMBULATORY_CARE_PROVIDER_SITE_OTHER): Payer: Managed Care, Other (non HMO) | Admitting: Family Medicine

## 2018-05-26 ENCOUNTER — Other Ambulatory Visit: Payer: Self-pay | Admitting: Family Medicine

## 2018-05-26 DIAGNOSIS — J309 Allergic rhinitis, unspecified: Secondary | ICD-10-CM

## 2018-05-26 DIAGNOSIS — M542 Cervicalgia: Secondary | ICD-10-CM

## 2018-05-26 DIAGNOSIS — M25519 Pain in unspecified shoulder: Secondary | ICD-10-CM

## 2018-05-26 DIAGNOSIS — F418 Other specified anxiety disorders: Secondary | ICD-10-CM | POA: Diagnosis not present

## 2018-05-26 DIAGNOSIS — B9689 Other specified bacterial agents as the cause of diseases classified elsewhere: Secondary | ICD-10-CM

## 2018-05-26 DIAGNOSIS — J019 Acute sinusitis, unspecified: Secondary | ICD-10-CM

## 2018-05-26 MED ORDER — TIZANIDINE HCL 2 MG PO TABS: 1.0000 mg | ORAL_TABLET | Freq: Every evening | ORAL | 0 refills | Status: DC | PRN

## 2018-05-27 ENCOUNTER — Other Ambulatory Visit: Payer: Self-pay

## 2018-05-28 ENCOUNTER — Ambulatory Visit (HOSPITAL_BASED_OUTPATIENT_CLINIC_OR_DEPARTMENT_OTHER)
Admission: RE | Admit: 2018-05-28 | Discharge: 2018-05-28 | Disposition: A | Discharge status: Home / Self Care | Payer: 59 | Source: Ambulatory Visit | Attending: Family Medicine | Admitting: Family Medicine

## 2018-05-28 ENCOUNTER — Other Ambulatory Visit: Payer: Self-pay

## 2018-05-28 DIAGNOSIS — M542 Cervicalgia: Secondary | ICD-10-CM | POA: Insufficient documentation

## 2018-05-28 NOTE — Assessment & Plan Note (Addendum)
With most notably numbness in hands that is worsening over past few months. She has worse symptoms in left hand but does also have some symptoms in right hand. No fall or trauma but she does have new triplet grandchildren that live at her home and she helps with and holds frequently. Will try to add a muscle relaxer, moist heat, NSAIDs and xray neck to further evaluate. Might be complicated by Carpel Tunnel Syndrome especially on left encouraged to ice, apply Lidocaine patches, bracing to wrist(s). If does not improve will need referral to ortho.

## 2018-05-28 NOTE — Assessment & Plan Note (Signed)
Patient doing well on Sertraline despite her recent increased

## 2018-05-28 NOTE — Progress Notes (Signed)
Virtual Visit via Video Note  I connected with Jennifer Sandoval on 05/28/18 at  9:00 AM EDT by a video enabled telemedicine application and verified that I am speaking with the correct person using two identifiers.  Location: Patient: home Provider: home   I discussed the limitations of evaluation and management by telemedicine and the availability of in person appointments. The patient expressed understanding and agreed to proceed. [name], CMA attempted to get the patient set up on video platform was not able to complete so visit was completed via telephone.    Subjective:    Patient ID: Jennifer Sandoval, female    DOB: 1955-06-24, 63 y.o.   MRN: [id number]  No chief complaint on file.   HPI Patient is in today for evaluation of worsening shoulder pain, neck pain, and now numbness and weakness in hands L>R. With most notably numbness in hands that is worsening over past few months. She has worse symptoms in left hand but does also have some symptoms in right hand. No fall or trauma but she does have new triplet grandchildren that live at her home and she helps with and holds frequently. Her sinusitis is improved after treatment with antibiotics and she is managing her allergies adequately with her current meds. She is notably tired as she helps with her infant triplets at home. Denies CP/palp/SOB/HA/fevers/GI or GU c/o. Taking meds as prescribed  Past Medical History:  Diagnosis Date  . Acute sinusitis 07/31/2006  . ALLERGIC RHINITIS 08/15/2006  . Allergy    rhinitis  . Anemia    iron deficeincy  . ANEMIA-NOS 10/24/2006  . BASAL CELL CARCINOMA SKIN LOWER LIMB INCL HIP 02/15/2010  . BCC (basal cell carcinoma of skin) 07/10/2011   h/o  . Depression   . DEPRESSION 10/24/2006  . 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 Dry eyes, bilateral 01/30/2016  . Dyspepsia 04/19/2010  . FRACTURE, NOSE 10/22/2007  . History of gestational diabetes 01/30/2016  . Insomnia 01/23/2012  . Leiomyoma of  uterus, unspecified 02/15/2010  . Lumbar compression fracture ([place])    nonsurgical  . OSTEOARTHRITIS 10/24/2006  . PERIMENOPAUSAL STATUS 03/16/2010  . Post-menopause 03/16/2010   Qualifier: Diagnosis of  By: Charlett Blake MD, Erline Levine    . Preventative health care 08/31/2012  . SHOULDER PAIN, BILATERAL 10/10/2007    Past Surgical History:  Procedure Laterality Date  . ABDOMINAL HYSTERECTOMY  2009   partial, ovaries left in place, for painful, heavy menstrrual bleeding secondary to anemia and fibroids  . APPENDECTOMY    . CESAREAN SECTION     X 3  . CHOLECYSTECTOMY    . GASTRIC BYPASS    . NASAL SINUS SURGERY    . SEPTOPLASTY    . skin biopsy     L hip BCC, facial lesions were benign  . TUBAL LIGATION    . ventral herniorrhaphy     X 2    Family History  Problem Relation Age of Onset  . Cholelithiasis Mother   . Other Mother        Urinary incontince/ bladder prolapse/ h/o smoking  . Cancer Mother        laryngeal carcinoma  . Allergies Mother   . Arthritis Mother        s/p knee and shoulder replacement  . Dementia Mother        lewy body  . Other Father        Renal failure  . Hypertension Father   . Coronary artery disease Father  s/p bypass  . Aortic aneurysm Father   . Hyperlipidemia Father   . Heart disease Father 58       quadruple bypass  . Spina bifida Brother        with stunt/ self cath  . Seizures Brother        disorders  . ADD / ADHD Daughter   . Cancer Maternal Grandmother        colon/ smoker  . Cancer Maternal Grandfather        lung  . Cancer Paternal Grandmother        Breast  . Heart disease Paternal Grandfather   . Stroke Paternal Grandfather   . ADD / ADHD Daughter     Social History   Socioeconomic History  . Marital status: Married    Spouse name: Not on file  . Number of children: Not on file  . Years of education: Not on file  . Highest education level: Not on file  Occupational History  . Not on file  Social Needs  .  Financial resource strain: Not on file  . Food insecurity:    Worry: Not on file    Inability: Not on file  . Transportation needs:    Medical: Not on file    Non-medical: Not on file  Tobacco Use  . Smoking status: Never Smoker  . Smokeless tobacco: Never Used  Substance and Sexual Activity  . Alcohol use: Yes    Comment: special occasion  . Drug use: No  . Sexual activity: Yes    Partners: Male  Lifestyle  . Physical activity:    Days per week: Not on file    Minutes per session: Not on file  . Stress: Not on file  Relationships  . Social connections:    Talks on phone: Not on file    Gets together: Not on file    Attends religious service: Not on file    Active member of club or organization: Not on file    Attends meetings of clubs or organizations: Not on file    Relationship status: Not on file  . Intimate partner violence:    Fear of current or ex partner: Not on file    Emotionally abused: Not on file    Physically abused: Not on file    Forced sexual activity: Not on file  Other Topics Concern  . Not on file  Social History Narrative  . Not on file    Outpatient Medications Prior to Visit  Medication Sig Dispense Refill  . beclomethasone (QVAR REDIHALER) 40 MCG/ACT inhaler Inhale 2 puffs into the lungs 2 (two) times daily. 1 Inhaler 2  . Biotin 10 MG CAPS Take 1 tablet by mouth daily.    Marland Kitchen L-LYSINE PO Take by mouth.    . levocetirizine (XYZAL) 5 MG tablet Take 1 tablet (5 mg total) by mouth every evening. 30 tablet 3  . Melatonin 3 MG CAPS Take by mouth.    . montelukast (SINGULAIR) 5 MG chewable tablet 2 tab po q day 60 tablet 3  . Omega-3 Krill Oil 1000 MG CAPS Take by mouth daily.    . Probiotic Product (PROBIOTIC DAILY PO) Take 1 capsule by mouth daily.    . sertraline (ZOLOFT) 100 MG tablet TAKE 1.5 TABLETS BY MOUTH DAILY 168 tablet 1  . zinc gluconate 50 MG tablet Take 50 mg by mouth daily.    . clarithromycin (BIAXIN) 500 MG tablet Take 1 tablet  (500  mg total) by mouth 2 (two) times daily. 20 tablet 0  . doxycycline (VIBRA-TABS) 100 MG tablet Take 1 tablet (100 mg total) by mouth 2 (two) times daily. Can give caps or generic 28 tablet 0  . fluconazole (DIFLUCAN) 150 MG tablet Take 1 tablet as 1 dose, repeat if needed in 7 days. 2 tablet 0   No facility-administered medications prior to visit.     Allergies  Allergen Reactions  . Itraconazole     REACTION: Rash  . Levaquin [Levofloxacin In D5w]     Joint pain    Review of Systems  Constitutional: Positive for malaise/fatigue. Negative for fever.  HENT: Positive for congestion.   Eyes: Negative for blurred vision.  Respiratory: Negative for shortness of breath.   Cardiovascular: Negative for chest pain, palpitations and leg swelling.  Gastrointestinal: Negative for abdominal pain, blood in stool and nausea.  Genitourinary: Negative for dysuria and frequency.  Musculoskeletal: Positive for joint pain, myalgias and neck pain. Negative for falls.  Skin: Negative for rash.  Neurological: Positive for tingling, sensory change and focal weakness. Negative for dizziness, loss of consciousness, weakness and headaches.  Endo/Heme/Allergies: Negative for environmental allergies.  Psychiatric/Behavioral: Negative for depression. The patient is not nervous/anxious.        Objective:    Physical Exam unable to obtain via telephone visit  There were no vitals taken for this visit. Wt Readings from Last 3 Encounters:  01/14/18 123 lb 12.8 oz (56.2 kg)  09/24/17 125 lb 4.8 oz (56.8 kg)  02/25/17 127 lb 3.2 oz (57.7 kg)    Diabetic Foot Exam - Simple   No data filed     Lab Results  Component Value Date   WBC 9.4 11/01/2016   HGB 12.5 11/01/2016   HCT 39.0 11/01/2016   PLT 235 11/01/2016   GLUCOSE 94 11/01/2016   CHOL 132 01/30/2016   TRIG 95.0 01/30/2016   HDL 67.70 01/30/2016   LDLCALC 45 01/30/2016   ALT 37 (H) 01/30/2016   AST 34 01/30/2016   NA 139 11/01/2016    K 3.9 11/01/2016   CL 102 11/01/2016   CREATININE 0.81 11/01/2016   BUN 12 11/01/2016   CO2 29 11/01/2016   TSH 1.33 01/30/2016   INR 1.1 02/25/2007   HGBA1C 6.0 10/10/2011    Lab Results  Component Value Date   TSH 1.33 01/30/2016   Lab Results  Component Value Date   WBC 9.4 11/01/2016   HGB 12.5 11/01/2016   HCT 39.0 11/01/2016   MCV 89.7 11/01/2016   PLT 235 11/01/2016   Lab Results  Component Value Date   NA 139 11/01/2016   K 3.9 11/01/2016   CO2 29 11/01/2016   GLUCOSE 94 11/01/2016   BUN 12 11/01/2016   CREATININE 0.81 11/01/2016   BILITOT 0.3 01/30/2016   ALKPHOS 84 01/30/2016   AST 34 01/30/2016   ALT 37 (H) 01/30/2016   PROT 6.5 01/30/2016   ALBUMIN 4.3 01/30/2016   CALCIUM 9.2 11/01/2016   ANIONGAP 8 11/01/2016   GFR 90.93 01/30/2016   Lab Results  Component Value Date   CHOL 132 01/30/2016   Lab Results  Component Value Date   HDL 67.70 01/30/2016   Lab Results  Component Value Date   LDLCALC 45 01/30/2016   Lab Results  Component Value Date   TRIG 95.0 01/30/2016   Lab Results  Component Value Date   CHOLHDL 2 01/30/2016   Lab Results  Component Value Date  HGBA1C 6.0 10/10/2011       Assessment & Plan:   Problem List Items Addressed This Visit    Depression with anxiety    Patient doing well on Sertraline despite her recent increased      Allergic rhinitis    Allergies are manageable with current meds      Neck and shoulder pain    With most notably numbness in hands that is worsening over past few months. She has worse symptoms in left hand but does also have some symptoms in right hand. No fall or trauma but she does have new triplet grandchildren that live at her home and she helps with and holds frequently. Will try to add a muscle relaxer, moist heat, NSAIDs and xray neck to further evaluate. Might be complicated by Carpel Tunnel Syndrome especially on left encouraged to ice, apply Lidocaine patches, bracing to  wrist(s). If does not improve will need referral to ortho.      Acute bacterial sinusitis    Was treated and is doing well, notes significant improvement         I have discontinued Jennifer Sandoval's fluconazole, clarithromycin, and doxycycline. I am also having her maintain her Biotin, Probiotic Product (PROBIOTIC DAILY PO), L-LYSINE PO, zinc gluconate, Omega-3 Krill Oil, Melatonin, beclomethasone, sertraline, levocetirizine, and montelukast.  No orders of the defined types were placed in this encounter.  I discussed the assessment and treatment plan with the patient. The patient was provided an opportunity to ask questions and all were answered. The patient agreed with the plan and demonstrated an understanding of the instructions.   The patient was advised to call back or seek an in-person evaluation if the symptoms worsen or if the condition fails to improve as anticipated.  I provided 25 minutes of non-face-to-face time during this encounter.   Penni Homans, MD

## 2018-05-28 NOTE — Assessment & Plan Note (Signed)
Was treated and is doing well, notes significant improvement

## 2018-05-28 NOTE — Assessment & Plan Note (Signed)
Allergies are manageable with current meds

## 2018-06-04 ENCOUNTER — Other Ambulatory Visit: Payer: Self-pay | Admitting: Family Medicine

## 2018-07-10 DIAGNOSIS — M79642 Pain in left hand: Secondary | ICD-10-CM | POA: Diagnosis not present

## 2018-07-10 DIAGNOSIS — G5622 Lesion of ulnar nerve, left upper limb: Secondary | ICD-10-CM | POA: Diagnosis not present

## 2018-07-10 DIAGNOSIS — G5602 Carpal tunnel syndrome, left upper limb: Secondary | ICD-10-CM | POA: Diagnosis not present

## 2018-07-21 DIAGNOSIS — G5602 Carpal tunnel syndrome, left upper limb: Secondary | ICD-10-CM | POA: Diagnosis not present

## 2018-07-25 DIAGNOSIS — G5602 Carpal tunnel syndrome, left upper limb: Secondary | ICD-10-CM | POA: Diagnosis not present

## 2018-07-25 DIAGNOSIS — G5622 Lesion of ulnar nerve, left upper limb: Secondary | ICD-10-CM | POA: Diagnosis not present

## 2018-07-28 ENCOUNTER — Other Ambulatory Visit: Payer: Self-pay | Admitting: Family Medicine

## 2018-08-27 DIAGNOSIS — F411 Generalized anxiety disorder: Secondary | ICD-10-CM | POA: Diagnosis not present

## 2018-09-10 DIAGNOSIS — F411 Generalized anxiety disorder: Secondary | ICD-10-CM | POA: Diagnosis not present

## 2018-09-24 DIAGNOSIS — F411 Generalized anxiety disorder: Secondary | ICD-10-CM | POA: Diagnosis not present

## 2018-10-08 DIAGNOSIS — F411 Generalized anxiety disorder: Secondary | ICD-10-CM | POA: Diagnosis not present

## 2018-10-13 ENCOUNTER — Encounter: Payer: Self-pay | Admitting: Medical

## 2018-10-13 ENCOUNTER — Ambulatory Visit (INDEPENDENT_AMBULATORY_CARE_PROVIDER_SITE_OTHER): Payer: BC Managed Care – PPO | Admitting: Medical

## 2018-10-13 ENCOUNTER — Other Ambulatory Visit: Payer: Self-pay

## 2018-10-13 VITALS — Temp 97.5°F | Wt 130.0 lb

## 2018-10-13 DIAGNOSIS — J309 Allergic rhinitis, unspecified: Secondary | ICD-10-CM | POA: Diagnosis not present

## 2018-10-13 DIAGNOSIS — J329 Chronic sinusitis, unspecified: Secondary | ICD-10-CM

## 2018-10-13 MED ORDER — PREDNISONE 10 MG PO TABS: ORAL_TABLET | ORAL | 0 refills | Status: DC

## 2018-10-13 MED ORDER — DOXYCYCLINE HYCLATE 100 MG PO TABS: 100.0000 mg | ORAL_TABLET | Freq: Two times a day (BID) | ORAL | 0 refills | Status: DC

## 2018-10-13 MED ORDER — FLUCONAZOLE 150 MG PO TABS: 150.0000 mg | ORAL_TABLET | Freq: Once | ORAL | 1 refills | Status: AC

## 2018-10-13 NOTE — Patient Instructions (Signed)
Patient does have history of chronic allergic rhinitis with subsequent sinus infections in the past.  Recent signs/symptoms are suspicious again for reoccurrence.  Decided to prescribe doxycycline 14-day course at her request since often times needs prolonged course to improve completely.  Advised to continue Zyrtec.  After discussion patient indicates she does not want to take montelukast or Flonase.  She is comfortable taking 5-day taper dose of prednisone.  Explained to her that we need to watch her closely over the next 3 days to make sure she is improving.  If not then would recommend that she get tested for Covid through the drive-through test center.  Patient expressed understanding.  She is going to continue to stay at home as she has been doing and since she is ill recently.  Follow-up date to be determined after she gives me update report this Thursday.

## 2018-10-13 NOTE — Progress Notes (Signed)
Subjective:    Patient ID: Jennifer Sandoval, female    DOB: 24-Jul-1955, 63 y.o.   MRN: [id number]  HPI  Virtual Visit via Telephone Note  I connected with Jennifer Sandoval on 10/13/18 at  3:40 PM EDT by telephone and verified that I am speaking with the correct person using two identifiers.  Location: Patient: home Provider: office   I discussed the limitations, risks, security and privacy concerns of performing an evaluation and management service by telephone and the availability of in person appointments. I also discussed with the patient that there may be a patient responsible charge related to this service. The patient expressed understanding and agreed to proceed.   History of Present Illness:   Hx of allergies and sinus pressure off and on over past 3 weeks. She has pressure around frontal & maxillary sinus tenderness. Pt states allergies year round. Worse spring and fall. At lease 6 months.  Pt has pain on palpation over sinuses presently.   Pt has been on zyrtec. She states does not like to take montelukast. It seems to make her too drowsy.  Moderate cough but just now.(Notes she was not coughing until middle of interview) today.  No fever, chills, no sweat, and no body aches.  Can smell and no diarrhea.  Pt does not want to take flonase or montelukast.  She stays at home for most of the time.   Observations/Objective: General-no acute distress, pleasant, oriented and normal speech. HEENT-patient states both frontal and maxillary sinus pressure when she self palpates.  Assessment and Plan: Patient does have history of chronic allergic rhinitis with subsequent sinus infections in the past.  Recent signs/symptoms are suspicious again for reoccurrence.  Decided to prescribe doxycycline 14-day course at her request since often times needs prolonged course to improve completely.  Advised to continue Zyrtec.  After discussion patient indicates she does not want to take montelukast  or Flonase.  She is comfortable taking 5-day taper dose of prednisone.  Explained to her that we need to watch her closely over the next 3 days to make sure she is improving.  If not then would recommend that she get tested for Covid through the drive-through test center.  Patient expressed understanding.  She is going to continue to stay at home as she has been doing and since she is ill recently.  Follow-up date to be determined after she gives me update report this Thursday.  25 minutes spent with patient.  50% of time spent counseling patient on plan going forward.  Mackie Pai, PA-C  Follow Up Instructions:    I discussed the assessment and treatment plan with the patient. The patient was provided an opportunity to ask questions and all were answered. The patient agreed with the plan and demonstrated an understanding of the instructions.   The patient was advised to call back or seek an in-person evaluation if the symptoms worsen or if the condition fails to improve as anticipated.  I provided 25 minutes of non-face-to-face time during this encounter.   Mackie Pai, PA-C   Review of Systems  Constitutional: Negative for chills and fever.  HENT: Positive for congestion, postnasal drip, sinus pain and sneezing. Negative for tinnitus.   Respiratory: Negative for cough, chest tightness, shortness of breath and wheezing.        Except for on mid interview.  Cardiovascular: Negative for chest pain and palpitations.  Gastrointestinal: Negative for abdominal pain, blood in stool and constipation.  Musculoskeletal: Negative for back  pain and myalgias.  Hematological: Negative for adenopathy. Does not bruise/bleed easily.  Psychiatric/Behavioral: Negative for behavioral problems and confusion.       Objective:   Physical Exam        Assessment & Plan:

## 2018-10-15 ENCOUNTER — Telehealth: Payer: Self-pay | Admitting: *Deleted

## 2018-10-15 ENCOUNTER — Telehealth: Payer: Self-pay | Admitting: Medical

## 2018-10-15 MED ORDER — PREDNISONE 10 MG PO TABS: ORAL_TABLET | ORAL | Status: DC

## 2018-10-15 NOTE — Telephone Encounter (Signed)
Print rx of prednisone. Taper dose so never gets sent electronically on taper since too many characters. So taper as directed used on sig.  If you would fax over rx.

## 2018-10-15 NOTE — Telephone Encounter (Signed)
Pharmacy faxed over request for needing a sig for the prednisone that was sent in on 10/13/18.  " for sig code we need the tapering direction set in case we get audited, please send new rx"

## 2018-10-15 NOTE — Telephone Encounter (Signed)
rx faxed over to [facility]

## 2018-10-23 DIAGNOSIS — F411 Generalized anxiety disorder: Secondary | ICD-10-CM | POA: Diagnosis not present

## 2018-11-06 DIAGNOSIS — F411 Generalized anxiety disorder: Secondary | ICD-10-CM | POA: Diagnosis not present

## 2018-11-13 ENCOUNTER — Telehealth: Payer: Self-pay | Admitting: Family Medicine

## 2018-11-13 NOTE — Telephone Encounter (Signed)
Pt called in because she was prescribed doxycycline (VIBRA-TABS) 100 MG tablet for a sinus infection. Pt says that it cleared up but now it has come back, pt would like to know if [name] would refill antibiotic?   Pharmacy : [facility] in [place]

## 2018-11-13 NOTE — Telephone Encounter (Signed)
Since it has been a month should be reevaluated.  Appointment scheduled for tomorrow with [name] for a virtual

## 2018-11-14 ENCOUNTER — Telehealth: Payer: Self-pay | Admitting: Medical

## 2018-11-14 ENCOUNTER — Ambulatory Visit (INDEPENDENT_AMBULATORY_CARE_PROVIDER_SITE_OTHER): Payer: BC Managed Care – PPO | Admitting: Medical

## 2018-11-14 ENCOUNTER — Encounter: Payer: Self-pay | Admitting: Medical

## 2018-11-14 ENCOUNTER — Other Ambulatory Visit: Payer: Self-pay

## 2018-11-14 VITALS — Temp 98.2°F | Wt 128.0 lb

## 2018-11-14 DIAGNOSIS — J329 Chronic sinusitis, unspecified: Secondary | ICD-10-CM

## 2018-11-14 DIAGNOSIS — J309 Allergic rhinitis, unspecified: Secondary | ICD-10-CM | POA: Diagnosis not present

## 2018-11-14 MED ORDER — PREDNISONE 10 MG PO TABS: ORAL_TABLET | ORAL | 0 refills | Status: DC

## 2018-11-14 MED ORDER — FLUCONAZOLE 150 MG PO TABS: 150.0000 mg | ORAL_TABLET | Freq: Once | ORAL | 0 refills | Status: AC

## 2018-11-14 MED ORDER — AMOXICILLIN-POT CLAVULANATE 875-125 MG PO TABS: 1.0000 | ORAL_TABLET | Freq: Two times a day (BID) | ORAL | 0 refills | Status: DC

## 2018-11-14 NOTE — Progress Notes (Signed)
Subjective:    Patient ID: Jennifer Sandoval, female    DOB: 12-Dec-1955, 63 y.o.   MRN: [id number]  HPI  Virtual Visit via Video Note  I connected with Jennifer Sandoval on 11/14/18 at 10:40 AM EST by a video enabled telemedicine application and verified that I am speaking with the correct person using two identifiers.  Location: Patient: home Provider: office   I discussed the limitations of evaluation and management by telemedicine and the availability of in person appointments. The patient expressed understanding and agreed to proceed.  History of Present Illness:   Pt has some recurrent sinus pressure/pain. She had tx for sinus infection one month ago. She got better but after treatment she gradually got recurrent nasal congestion and now has some recurrent sinus pressure. When blows nose get yellow mucus. No fever, no chills, no sweats, no bodyaches, no loss smell, no nausea, no vomiting and no diarrhea.  Bending over increases pain. Upper teeth mild sensative.   Observations/Objective:  General-no acute distress, pleasant, oriented. Lungs- on inspection lungs appear unlabored. Neck- no tracheal deviation or jvd on inspection. Neuro- gross motor function appears intact. heent- frontal and maxillary sinus pressure when pt self palpates.   Assessment and Plan: For allergic rhinitis and likely recurrent sinus infection I rx'd  augmentin antibiotic. Prednisone 6 day taper dose faxed to pharmacy. Also asked MA to call and verify they got. Continue your zyrtec. Counseled on potential use flonase and montelukast.(last time declined but if allergies recurrent do think good idea) Discussed staying at home next 5 days confirm s/s improve. If not then covid test. She does not want to get tested presently.  Follow up 7 days or as needed  Mackie Pai, PA-C  Follow Up Instructions:    I discussed the assessment and treatment plan with the patient. The patient was provided an opportunity  to ask questions and all were answered. The patient agreed with the plan and demonstrated an understanding of the instructions.   The patient was advised to call back or seek an in-person evaluation if the symptoms worsen or if the condition fails to improve as anticipated.  I provided 25 minutes of non-face-to-face time during this encounter.   Mackie Pai, PA-C    Review of Systems  Constitutional: Negative for chills, fatigue and fever.  HENT: Positive for congestion, postnasal drip, rhinorrhea, sinus pressure and sinus pain. Negative for sore throat.   Respiratory: Negative for cough, choking and wheezing.   Cardiovascular: Negative for chest pain and palpitations.  Gastrointestinal: Negative for abdominal pain.  Musculoskeletal: Negative for back pain, myalgias and neck pain.  Skin: Negative for rash.  Neurological: Negative for dizziness, speech difficulty and light-headedness.  Hematological: Negative for adenopathy. Does not bruise/bleed easily.  Psychiatric/Behavioral: Negative for behavioral problems and confusion. The patient is not nervous/anxious.    Past Medical History:  Diagnosis Date  . Acute sinusitis 07/31/2006  . ALLERGIC RHINITIS 08/15/2006  . Allergy    rhinitis  . Anemia    iron deficeincy  . ANEMIA-NOS 10/24/2006  . BASAL CELL CARCINOMA SKIN LOWER LIMB INCL HIP 02/15/2010  . BCC (basal cell carcinoma of skin) 07/10/2011   h/o  . Depression   . DEPRESSION 10/24/2006  . 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 Dry eyes, bilateral 01/30/2016  . Dyspepsia 04/19/2010  . FRACTURE, NOSE 10/22/2007  . History of gestational diabetes 01/30/2016  . Insomnia 01/23/2012  . Leiomyoma of uterus, unspecified 02/15/2010  . Lumbar compression fracture ([facility])  nonsurgical  . OSTEOARTHRITIS 10/24/2006  . PERIMENOPAUSAL STATUS 03/16/2010  . Post-menopause 03/16/2010   Qualifier: Diagnosis of  By: Charlett Blake MD, Erline Levine    . Preventative health care 08/31/2012  . SHOULDER  PAIN, BILATERAL 10/10/2007     Social History   Socioeconomic History  . Marital status: Married    Spouse name: Not on file  . Number of children: Not on file  . Years of education: Not on file  . Highest education level: Not on file  Occupational History  . Not on file  Social Needs  . Financial resource strain: Not on file  . Food insecurity    Worry: Not on file    Inability: Not on file  . Transportation needs    Medical: Not on file    Non-medical: Not on file  Tobacco Use  . Smoking status: Never Smoker  . Smokeless tobacco: Never Used  Substance and Sexual Activity  . Alcohol use: Yes    Comment: special occasion  . Drug use: No  . Sexual activity: Yes    Partners: Male  Lifestyle  . Physical activity    Days per week: Not on file    Minutes per session: Not on file  . Stress: Not on file  Relationships  . Social Herbalist on phone: Not on file    Gets together: Not on file    Attends religious service: Not on file    Active member of club or organization: Not on file    Attends meetings of clubs or organizations: Not on file    Relationship status: Not on file  . Intimate partner violence    Fear of current or ex partner: Not on file    Emotionally abused: Not on file    Physically abused: Not on file    Forced sexual activity: Not on file  Other Topics Concern  . Not on file  Social History Narrative  . Not on file    Past Surgical History:  Procedure Laterality Date  . ABDOMINAL HYSTERECTOMY  2009   partial, ovaries left in place, for painful, heavy menstrrual bleeding secondary to anemia and fibroids  . APPENDECTOMY    . CESAREAN SECTION     X 3  . CHOLECYSTECTOMY    . GASTRIC BYPASS    . NASAL SINUS SURGERY    . SEPTOPLASTY    . skin biopsy     L hip BCC, facial lesions were benign  . TUBAL LIGATION    . ventral herniorrhaphy     X 2    Family History  Problem Relation Age of Onset  . Cholelithiasis Mother   . Other  Mother        Urinary incontince/ bladder prolapse/ h/o smoking  . Cancer Mother        laryngeal carcinoma  . Allergies Mother   . Arthritis Mother        s/p knee and shoulder replacement  . Dementia Mother        lewy body  . Other Father        Renal failure  . Hypertension Father   . Coronary artery disease Father        s/p bypass  . Aortic aneurysm Father   . Hyperlipidemia Father   . Heart disease Father 42       quadruple bypass  . Spina bifida Brother        with stunt/ self cath  .  Seizures Brother        disorders  . ADD / ADHD Daughter   . Cancer Maternal Grandmother        colon/ smoker  . Cancer Maternal Grandfather        lung  . Cancer Paternal Grandmother        Breast  . Heart disease Paternal Grandfather   . Stroke Paternal Grandfather   . ADD / ADHD Daughter     Allergies  Allergen Reactions  . Itraconazole     REACTION: Rash  . Levaquin [Levofloxacin In D5w]     Joint pain    Current Outpatient Medications on File Prior to Visit  Medication Sig Dispense Refill  . beclomethasone (QVAR REDIHALER) 40 MCG/ACT inhaler Inhale 2 puffs into the lungs 2 (two) times daily. 1 Inhaler 2  . Biotin 10 MG CAPS Take 1 tablet by mouth daily.    Marland Kitchen L-LYSINE PO Take by mouth.    . levocetirizine (XYZAL) 5 MG tablet Take 1 tablet (5 mg total) by mouth every evening. 30 tablet 3  . Melatonin 3 MG CAPS Take by mouth.    . montelukast (SINGULAIR) 5 MG chewable tablet 2 tab po q day 60 tablet 3  . Omega-3 Krill Oil 1000 MG CAPS Take by mouth daily.    . Probiotic Product (PROBIOTIC DAILY PO) Take 1 capsule by mouth daily.    . sertraline (ZOLOFT) 100 MG tablet TAKE 1 AND 1/2 TABLETS BY MOUTH EVERY DAY 135 tablet 3  . tiZANidine (ZANAFLEX) 2 MG tablet TAKE 0.5-2 TABLETS (1-4 MG TOTAL) BY MOUTH AT BEDTIME AS NEEDED FOR MUSCLE SPASMS. 30 tablet 0  . zinc gluconate 50 MG tablet Take 50 mg by mouth daily.     No current facility-administered medications on file prior  to visit.     Temp 98.2 F (36.8 C) (Oral)   Wt 128 lb (58.1 kg)   BMI 23.41 kg/m       Objective:   Physical Exam        Assessment & Plan:

## 2018-11-14 NOTE — Patient Instructions (Addendum)
For allergic rhinitis and likely recurrent sinus infection I rx'd  augmentin antibiotic. Prednisone 6 day taper dose faxed to pharmacy. Also asked MA to call and verify they got. Continue your zyrtec. Counseled on potential use flonase and montelukast.(last time declined but if allergies recurrent do think good idea) Discussed staying at home next 5 days confirm s/s improve. If not then covid test. She does not want to get tested presently.  Follow up 7 days or as needed

## 2018-11-14 NOTE — Telephone Encounter (Signed)
Would you fax pt pred[facility]one to [facility]. Call and confirm as well. Last time rx'd they did not fill?

## 2018-11-24 DIAGNOSIS — F411 Generalized anxiety disorder: Secondary | ICD-10-CM | POA: Diagnosis not present

## 2018-12-10 DIAGNOSIS — F411 Generalized anxiety disorder: Secondary | ICD-10-CM | POA: Diagnosis not present

## 2018-12-22 DIAGNOSIS — H35363 Drusen (degenerative) of macula, bilateral: Secondary | ICD-10-CM | POA: Diagnosis not present

## 2018-12-24 DIAGNOSIS — F411 Generalized anxiety disorder: Secondary | ICD-10-CM | POA: Diagnosis not present

## 2019-01-08 DIAGNOSIS — F411 Generalized anxiety disorder: Secondary | ICD-10-CM | POA: Diagnosis not present

## 2019-01-21 DIAGNOSIS — F411 Generalized anxiety disorder: Secondary | ICD-10-CM | POA: Diagnosis not present

## 2019-01-27 ENCOUNTER — Encounter: Payer: Self-pay | Admitting: Medical

## 2019-01-27 ENCOUNTER — Ambulatory Visit (INDEPENDENT_AMBULATORY_CARE_PROVIDER_SITE_OTHER): Payer: BC Managed Care – PPO | Admitting: Medical

## 2019-01-27 ENCOUNTER — Other Ambulatory Visit: Payer: Self-pay

## 2019-01-27 ENCOUNTER — Ambulatory Visit: Payer: BC Managed Care – PPO | Admitting: Medical

## 2019-01-27 VITALS — Temp 97.9°F

## 2019-01-27 DIAGNOSIS — J309 Allergic rhinitis, unspecified: Secondary | ICD-10-CM | POA: Diagnosis not present

## 2019-01-27 DIAGNOSIS — J329 Chronic sinusitis, unspecified: Secondary | ICD-10-CM

## 2019-01-27 MED ORDER — DOXYCYCLINE HYCLATE 100 MG PO TABS: ORAL_TABLET | ORAL | 0 refills | Status: DC

## 2019-01-27 MED ORDER — PREDNISONE 10 MG (21) PO TBPK: ORAL_TABLET | ORAL | 0 refills | Status: DC

## 2019-01-27 NOTE — Patient Instructions (Signed)
Recent sinus pressure/pain for 2 weeks. Hx of sinus infections and allergies intermittently. She declines offer for  steroid nasal spray. Did well with augmentin and taper prednisone about 2 months ago.  Will rx doxycycline today and 6 day taper dose.  Doubt covid type presentation today as 2 weeks into signs/symptoms. Advise stay at home over next 3-4 days. If no improvement then reconsider dx and in that event recommend covid test but not indicated presently.  Follow up in 2 weeks or as needed

## 2019-01-27 NOTE — Progress Notes (Signed)
   Subjective:    Patient ID: Jennifer Sandoval, female    DOB: 01-24-1955, 64 y.o.   MRN: [id number]  HPI  Virtual Visit via Telephone Note  I connected with [name] on 01/27/19 at  3:20 PM EST by telephone and verified that I am speaking with the correct person using two identifiers.  Location: Patient: home Provider: office.   I discussed the limitations, risks, security and privacy concerns of performing an evaluation and management service by telephone and the availability of in person appointments. I also discussed with the patient that there may be a patient responsible charge related to this service. The patient expressed understanding and agreed to proceed.  Pt has no  bpcuff.    History of Present Illness:  Pt states some recurrent sinus pressure over past 2 weeks. Hx of allergies sinus infection. In mid November. Las time responded well to augmentin and prednisone.  Pt stats frontal and ethmoid sinus pain.  Some colored mucus from her nose.  Some pnd.   Observations/Objective: General- no acute distress, pleasant, alert and oriented. Pleasant. heent- states sinus pressure.  Assessment and Plan: Recent sinus pressure/pain for 2 weeks. Hx of sinus infections and allergies intermittently. She declines offer for  steroid nasal spray. Did well with augmentin and taper prednisone about 2 months ago.  Will rx doxycycline today and 6 day taper dose.  Doubt covid type presentation today as 2 weeks into signs/symptoms. Advise stay at home over next 3-4 days. If no improvement then reconsider dx and in that event recommend covid test but not indicated presently.  Follow up in 2 weeks or as needed  Follow Up Instructions:    I discussed the assessment and treatment plan with the patient. The patient was provided an opportunity to ask questions and all were answered. The patient agreed with the plan and demonstrated an understanding of the instructions.   The patient was  advised to call back or seek an in-person evaluation if the symptoms worsen or if the condition fails to improve as anticipated.  I provided 20 minutes of non-face-to-face time during this encounter.   Mackie Pai, PA-C   Review of Systems  Constitutional: Negative for chills, fatigue and fever.  HENT: Positive for congestion and postnasal drip. Negative for ear discharge, ear pain and sinus pressure.   Respiratory: Negative for cough, choking, chest tightness, shortness of breath and wheezing.   Cardiovascular: Negative for chest pain and palpitations.  Gastrointestinal: Negative for abdominal pain.  Musculoskeletal: Negative for arthralgias, back pain and gait problem.  Skin: Negative for rash.  Neurological: Negative for dizziness, syncope, numbness and headaches.  Psychiatric/Behavioral: Negative for behavioral problems and confusion.       Objective:   Physical Exam        Assessment & Plan:

## 2019-02-05 DIAGNOSIS — F411 Generalized anxiety disorder: Secondary | ICD-10-CM | POA: Diagnosis not present

## 2019-02-19 DIAGNOSIS — F411 Generalized anxiety disorder: Secondary | ICD-10-CM | POA: Diagnosis not present

## 2019-03-05 DIAGNOSIS — F411 Generalized anxiety disorder: Secondary | ICD-10-CM | POA: Diagnosis not present

## 2019-03-11 DIAGNOSIS — M25522 Pain in left elbow: Secondary | ICD-10-CM | POA: Diagnosis not present

## 2019-03-11 DIAGNOSIS — M79642 Pain in left hand: Secondary | ICD-10-CM | POA: Diagnosis not present

## 2019-03-19 DIAGNOSIS — F411 Generalized anxiety disorder: Secondary | ICD-10-CM | POA: Diagnosis not present

## 2019-03-27 ENCOUNTER — Ambulatory Visit (INDEPENDENT_AMBULATORY_CARE_PROVIDER_SITE_OTHER): Payer: BC Managed Care – PPO | Admitting: Medical

## 2019-03-27 ENCOUNTER — Other Ambulatory Visit: Payer: Self-pay

## 2019-03-27 DIAGNOSIS — J019 Acute sinusitis, unspecified: Secondary | ICD-10-CM | POA: Diagnosis not present

## 2019-03-27 DIAGNOSIS — J309 Allergic rhinitis, unspecified: Secondary | ICD-10-CM | POA: Diagnosis not present

## 2019-03-27 DIAGNOSIS — B9689 Other specified bacterial agents as the cause of diseases classified elsewhere: Secondary | ICD-10-CM | POA: Diagnosis not present

## 2019-03-27 MED ORDER — TOBRAMYCIN 0.3 % OP SOLN: 2.0000 [drp] | Freq: Four times a day (QID) | OPHTHALMIC | 0 refills | Status: DC

## 2019-03-27 MED ORDER — DOXYCYCLINE HYCLATE 100 MG PO TABS: ORAL_TABLET | ORAL | 0 refills | Status: DC

## 2019-03-27 MED ORDER — FLUTICASONE PROPIONATE 50 MCG/ACT NA SUSP: 2.0000 | Freq: Every day | NASAL | 1 refills | Status: DC

## 2019-03-27 MED ORDER — LEVOCETIRIZINE DIHYDROCHLORIDE 5 MG PO TABS: 5.0000 mg | ORAL_TABLET | Freq: Every evening | ORAL | 3 refills | Status: DC

## 2019-03-27 MED ORDER — FLUCONAZOLE 150 MG PO TABS: ORAL_TABLET | ORAL | 0 refills | Status: DC

## 2019-03-27 NOTE — Progress Notes (Signed)
Subjective:    Patient ID: Jennifer Sandoval, female    DOB: 1955-07-02, 64 y.o.   MRN: [id number]  HPI   Virtual Visit via Telephone Note  I connected with Jennifer Sandoval on 03/29/19 at  3:40 PM EDT by telephone and verified that I am speaking with the correct person using two identifiers.  Location: Patient: home  Provider: office   I discussed the limitations, risks, security and privacy concerns of performing an evaluation and management service by telephone and the availability of in person appointments. I also discussed with the patient that there may be a patient responsible charge related to this service. The patient expressed understanding and agreed to proceed.   History of Present Illness: Onset of 2 weeks  Cough from pnd, nasal congestion,  and sinus pressure. Has been sneezing for for 1 week. Pt states blowing nose with some colored mucus.   Pt has some left eye region pain. Mild red eye. Mild red and puffy left lower eye lid.  Pt in past has done well with doxycycline.  She states will need diflucan.  Pt has been taking mucinex. She does not like nasal sprays.  No fever, no chills, no myalgias, no fatigue and no loss of smell/taste.   Observations/Objective: General- no acute distress, pleasant, alert, oriented and normal speech.  Assessment and Plan: Recent allergic rhinitis with sinus infection. Will rx xyzal anthistamine, flonase and doxycycline antibiotic. Some possible conjunctivitis as well by description so rx tobrex eye drops to use if needed.  Follow up 7 days or as needed  Note did discuss by her description covid not likely but if symptoms persist, worsen or change then could get covid test.   Follow Up Instructions:    I discussed the assessment and treatment plan with the patient. The patient was provided an opportunity to ask questions and all were answered. The patient agreed with the plan and demonstrated an understanding of the instructions.   The  patient was advised to call back or seek an in-person evaluation if the symptoms worsen or if the condition fails to improve as anticipated.  I provided 20 minutes of non-face-to-face time during this encounter.   Arzell Mcgeehan, PA-C  Onset of 2 weeks  Cough from pnd, nasal congestion,  and sinus pressure. Has been sneezing for for 1 week. Pt states blowing nose with some colored mucus.   Pt has some left eye region pain. Mild red eye. Mild red and puffy left lower eye lid.  Pt in past has done well with doxycycline.  She states will need diflucan.  Pt has been taking mucinex. She does not like nasal sprays.    Review of Systems  Constitutional: Negative for chills, fatigue and fever.  HENT: Positive for congestion, postnasal drip and sinus pressure. Negative for sore throat.   Respiratory: Negative for cough, chest tightness, shortness of breath and wheezing.   Cardiovascular: Negative for chest pain and palpitations.  Gastrointestinal: Negative for abdominal pain.  Musculoskeletal: Negative for back pain and myalgias.  Skin: Negative for rash.  Neurological: Negative for dizziness, speech difficulty, weakness, light-headedness and headaches.  Hematological: Negative for adenopathy. Does not bruise/bleed easily.  Psychiatric/Behavioral: Negative for behavioral problems and confusion.   Past Medical History:  Diagnosis Date  . Acute sinusitis 07/31/2006  . ALLERGIC RHINITIS 08/15/2006  . Allergy    rhinitis  . Anemia    iron deficeincy  . ANEMIA-NOS 10/24/2006  . BASAL CELL CARCINOMA SKIN LOWER LIMB INCL HIP  02/15/2010  . BCC (basal cell carcinoma of skin) 07/10/2011   h/o  . Depression   . DEPRESSION 10/24/2006  . 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 Dry eyes, bilateral 01/30/2016  . Dyspepsia 04/19/2010  . FRACTURE, NOSE 10/22/2007  . History of gestational diabetes 01/30/2016  . Insomnia 01/23/2012  . Leiomyoma of uterus, unspecified 02/15/2010  . Lumbar  compression fracture ([place])    nonsurgical  . OSTEOARTHRITIS 10/24/2006  . PERIMENOPAUSAL STATUS 03/16/2010  . Post-menopause 03/16/2010   Qualifier: Diagnosis of  By: Charlett Blake MD, Erline Levine    . Preventative health care 08/31/2012  . SHOULDER PAIN, BILATERAL 10/10/2007     Social History   Socioeconomic History  . Marital status: Married    Spouse name: Not on file  . Number of children: Not on file  . Years of education: Not on file  . Highest education level: Not on file  Occupational History  . Not on file  Tobacco Use  . Smoking status: Never Smoker  . Smokeless tobacco: Never Used  Substance and Sexual Activity  . Alcohol use: Yes    Comment: special occasion  . Drug use: No  . Sexual activity: Yes    Partners: Male  Other Topics Concern  . Not on file  Social History Narrative  . Not on file   Social Determinants of Health   Financial Resource Strain:   . Difficulty of Paying Living Expenses:   Food Insecurity:   . Worried About Charity fundraiser in the Last Year:   . Arboriculturist in the Last Year:   Transportation Needs:   . Film/video editor (Medical):   Marland Kitchen Lack of Transportation (Non-Medical):   Physical Activity:   . Days of Exercise per Week:   . Minutes of Exercise per Session:   Stress:   . Feeling of Stress :   Social Connections:   . Frequency of Communication with Friends and Family:   . Frequency of Social Gatherings with Friends and Family:   . Attends Religious Services:   . Active Member of Clubs or Organizations:   . Attends Archivist Meetings:   Marland Kitchen Marital Status:   Intimate Partner Violence:   . Fear of Current or Ex-Partner:   . Emotionally Abused:   Marland Kitchen Physically Abused:   . Sexually Abused:     Past Surgical History:  Procedure Laterality Date  . ABDOMINAL HYSTERECTOMY  2009   partial, ovaries left in place, for painful, heavy menstrrual bleeding secondary to anemia and fibroids  . APPENDECTOMY    . CESAREAN SECTION      X 3  . CHOLECYSTECTOMY    . GASTRIC BYPASS    . NASAL SINUS SURGERY    . SEPTOPLASTY    . skin biopsy     L hip BCC, facial lesions were benign  . TUBAL LIGATION    . ventral herniorrhaphy     X 2    Family History  Problem Relation Age of Onset  . Cholelithiasis Mother   . Other Mother        Urinary incontince/ bladder prolapse/ h/o smoking  . Cancer Mother        laryngeal carcinoma  . Allergies Mother   . Arthritis Mother        s/p knee and shoulder replacement  . Dementia Mother        lewy body  . Other Father        Renal  failure  . Hypertension Father   . Coronary artery disease Father        s/p bypass  . Aortic aneurysm Father   . Hyperlipidemia Father   . Heart disease Father 33       quadruple bypass  . Spina bifida Brother        with stunt/ self cath  . Seizures Brother        disorders  . ADD / ADHD Daughter   . Cancer Maternal Grandmother        colon/ smoker  . Cancer Maternal Grandfather        lung  . Cancer Paternal Grandmother        Breast  . Heart disease Paternal Grandfather   . Stroke Paternal Grandfather   . ADD / ADHD Daughter     Allergies  Allergen Reactions  . Itraconazole     REACTION: Rash  . Levaquin [Levofloxacin In D5w]     Joint pain    Current Outpatient Medications on File Prior to Visit  Medication Sig Dispense Refill  . beclomethasone (QVAR REDIHALER) 40 MCG/ACT inhaler Inhale 2 puffs into the lungs 2 (two) times daily. 1 Inhaler 2  . Biotin 10 MG CAPS Take 1 tablet by mouth daily.    Marland Kitchen L-LYSINE PO Take by mouth.    . levocetirizine (XYZAL) 5 MG tablet Take 1 tablet (5 mg total) by mouth every evening. 30 tablet 3  . Melatonin 3 MG CAPS Take by mouth.    . montelukast (SINGULAIR) 5 MG chewable tablet 2 tab po q day 60 tablet 3  . Omega-3 Krill Oil 1000 MG CAPS Take by mouth daily.    . predniSONE (STERAPRED UNI-PAK 21 TAB) 10 MG (21) TBPK tablet Taper over 6 days. 21 tablet 0  . Probiotic Product  (PROBIOTIC DAILY PO) Take 1 capsule by mouth daily.    . sertraline (ZOLOFT) 100 MG tablet TAKE 1 AND 1/2 TABLETS BY MOUTH EVERY DAY 135 tablet 3  . tiZANidine (ZANAFLEX) 2 MG tablet TAKE 0.5-2 TABLETS (1-4 MG TOTAL) BY MOUTH AT BEDTIME AS NEEDED FOR MUSCLE SPASMS. 30 tablet 0  . zinc gluconate 50 MG tablet Take 50 mg by mouth daily.     No current facility-administered medications on file prior to visit.    There were no vitals taken for this visit.      Objective:   Physical Exam        Assessment & Plan:

## 2019-03-27 NOTE — Patient Instructions (Addendum)
Recent allergic rhinitis with sinus infection. Will rx xyzal anthistamine, flonase and doxycycline antibiotic. Some possible conjunctivitis as well by description so rx tobrex eye drops to use if needed.  Follow up 7 days or as needed

## 2019-04-09 DIAGNOSIS — F411 Generalized anxiety disorder: Secondary | ICD-10-CM | POA: Diagnosis not present

## 2019-04-18 ENCOUNTER — Other Ambulatory Visit: Payer: Self-pay | Admitting: Medical

## 2019-04-30 DIAGNOSIS — F411 Generalized anxiety disorder: Secondary | ICD-10-CM | POA: Diagnosis not present

## 2019-06-10 DIAGNOSIS — F411 Generalized anxiety disorder: Secondary | ICD-10-CM | POA: Diagnosis not present

## 2019-06-16 ENCOUNTER — Other Ambulatory Visit: Payer: Self-pay

## 2019-06-16 ENCOUNTER — Telehealth (INDEPENDENT_AMBULATORY_CARE_PROVIDER_SITE_OTHER): Payer: BC Managed Care – PPO | Admitting: Medical

## 2019-06-16 DIAGNOSIS — B9689 Other specified bacterial agents as the cause of diseases classified elsewhere: Secondary | ICD-10-CM | POA: Diagnosis not present

## 2019-06-16 DIAGNOSIS — J309 Allergic rhinitis, unspecified: Secondary | ICD-10-CM | POA: Diagnosis not present

## 2019-06-16 DIAGNOSIS — J019 Acute sinusitis, unspecified: Secondary | ICD-10-CM

## 2019-06-16 MED ORDER — DOXYCYCLINE HYCLATE 100 MG PO TABS: 100.0000 mg | ORAL_TABLET | Freq: Two times a day (BID) | ORAL | 0 refills | Status: DC

## 2019-06-16 MED ORDER — FLUCONAZOLE 150 MG PO TABS: ORAL_TABLET | ORAL | 0 refills | Status: DC

## 2019-06-16 NOTE — Patient Instructions (Addendum)
Recent allergic rhinitis followed by  sinus infection. Can continue zyrtec anthistamine, flonase and adding doxycycline antibiotic. Hx of recurrent sinus infection and chronic hx as well in remote past prior to surgery years ago.    Follow up 10-14days or as needed

## 2019-06-16 NOTE — Progress Notes (Signed)
   Subjective:    Patient ID: Jennifer Sandoval, female    DOB: 1955-06-15, 64 y.o.   MRN: [id number]  HPI  Virtual Visit via Video Note  I connected with [name] on 06/16/19 at  2:00 PM EDT by a video enabled telemedicine application and verified that I am speaking with the correct person using two identifiers.  Location: Patient: home Provider: office  Pt has not checked her vitals.    I discussed the limitations of evaluation and management by telemedicine and the availability of in person appointments. The patient expressed understanding and agreed to proceed.  History of Present Illness: Pt has some left side sinus pressure just recently. Pt states symptoms for past 2 weeks. Mild nasal congested symptoms. Tried zyrtec and she tried mucinex. Pt has pain left side sinus pressure on palpation. When blows her nose getting colored mucus.  Pt tried flonase but did not stop her symptoms.  Pt did tried afrin for 2-3 doses but has stopped. Tried ibuprofen for ha.  No fever, no chills, no myalgias, no fatigue and no loss of smell/taste.  In the past 14 days doxycycline clears her infections. Pt does have probiotics. Needs diflucan if antibiotic written per her report..  3 sinus infections since January. Pt has seen ent in the past. Years ago had surgery to repair deviated septum and had sinus surgery 20 years ago.    Observations/Objective:  General-no acute distress, pleasant, oriented. Lungs- on inspection lungs appear unlabored. Neck- no tracheal deviation or jvd on inspection. Neuro- gross motor function appears intact. heent- frontal and maxillary sinus pressure on palpaton.  Assessment and Plan: Recent allergic rhinitis followed by  sinus infection. Can continue zyrtec anthistamine, flonase and adding doxycycline antibiotic. Hx of recurrent sinus infection and chronic hx as well in remote past prior to surgery years ago.    Follow up 10-14days or as needed  Time spent  with patient today was 25  minutes which consisted of chart review, discussing diagnosis, work up, treatment and documentation.  Follow Up Instructions:    I discussed the assessment and treatment plan with the patient. The patient was provided an opportunity to ask questions and all were answered. The patient agreed with the plan and demonstrated an understanding of the instructions.   The patient was advised to call back or seek an in-person evaluation if the symptoms worsen or if the condition fails to improve as anticipated.     Mackie Pai, PA-C    Review of Systems     Objective:   Physical Exam        Assessment & Plan:

## 2019-06-30 ENCOUNTER — Other Ambulatory Visit: Payer: Self-pay | Admitting: Family Medicine

## 2019-09-16 ENCOUNTER — Other Ambulatory Visit: Payer: Self-pay

## 2019-09-16 ENCOUNTER — Telehealth (INDEPENDENT_AMBULATORY_CARE_PROVIDER_SITE_OTHER): Payer: Commercial Managed Care - PPO | Admitting: Medical

## 2019-09-16 DIAGNOSIS — J309 Allergic rhinitis, unspecified: Secondary | ICD-10-CM | POA: Diagnosis not present

## 2019-09-16 DIAGNOSIS — J329 Chronic sinusitis, unspecified: Secondary | ICD-10-CM

## 2019-09-16 MED ORDER — DOXYCYCLINE HYCLATE 100 MG PO TABS: 100.0000 mg | ORAL_TABLET | Freq: Two times a day (BID) | ORAL | 0 refills | Status: DC

## 2019-09-16 MED ORDER — FLUCONAZOLE 150 MG PO TABS: ORAL_TABLET | ORAL | 0 refills | Status: DC

## 2019-09-16 MED ORDER — FLUTICASONE PROPIONATE 50 MCG/ACT NA SUSP: 2.0000 | Freq: Every day | NASAL | 5 refills | Status: DC

## 2019-09-16 NOTE — Progress Notes (Signed)
° °  Subjective:    Patient ID: Jennifer Sandoval, female    DOB: 10/05/1955, 64 y.o.   MRN: [id number]  HPI  Virtual Visit via Video Note  I connected with [name] on 09/16/19 at  9:20 AM EDT by a video enabled telemedicine application and verified that I am speaking with the correct person using two identifiers.  Location: Patient: home Provider: office.    I discussed the limitations of evaluation and management by telemedicine and the availability of in person appointments. The patient expressed understanding and agreed to proceed.  No bp check, no vitals. She does not have equipment.  History of Present Illness: Pt has some rt side sinus pressure just recently. Pt states symptoms for past 2 weeks. Mild nasal congested symptoms. Tried zyrtec and she tried mucinex until she ran out. Pt has pain rt side sinus pressure on palpation. When blows her nose getting colored mucus.  Pt has not tried flonase.  Pt did tried afrin tried afrin 1-2 times past 5 days. Tried ibuprofen for ha.  No fever, no chills, no myalgias, no fatigue and no loss of smell/taste.  In the past 14 days doxycycline clears her infections. Pt does have probiotics. Needs diflucan if antibiotic written per her report.Marland Kitchen  4 sinus infections since January. Pt has seen ent in the past. Years ago had surgery to repair deviated septum and had sinus surgery 20 years ago.  Allergic rhinitis hx and worse when season changes.  Pt is not diabetic.  Pt has not gotten covid vaccine.   Observations/Objective: General-no acute distress, pleasant, oriented. Lungs- on inspection lungs appear unlabored. Neck- no tracheal deviation or jvd on inspection. Neuro- gross motor function appears intact. heent- frontal sinus pressure to palpation. Also rt maxillary pressure to self palpation.  Assessment and Plan: Recent probable allergic rhinitis followed by  sinus infection(hx of recurrent infection in past. Can continue zyrtec  anthistamine, start  flonase and adding doxycycline antibiotic. Hx of recurrent sinus infection and chronic hx as well in remote past prior to surgery years ago.  Rx diflucan for yeast infection if gets.  Stop afrin to avoid rebound nasal congestion.  Follow up 10 days or as needed.  Counseled on doing covid test if signs and symptoms change as discussed.   Advised on probiotics.  Follow Up Instructions:    I discussed the assessment and treatment plan with the patient. The patient was provided an opportunity to ask questions and all were answered. The patient agreed with the plan and demonstrated an understanding of the instructions.   The patient was advised to call back or seek an in-person evaluation if the symptoms worsen or if the condition fails to improve as anticipated.  I provided 25  minutes of non-face-to-face time during this encounter.   Mackie Pai, PA-C  Review of Systems     Objective:   Physical Exam        Assessment & Plan:

## 2019-09-16 NOTE — Patient Instructions (Signed)
Recent probable allergic rhinitis followed by  sinus infection(hx of recurrent infection in past. Can continue zyrtec anthistamine, start  flonase and adding doxycycline antibiotic. Hx of recurrent sinus infection and chronic hx as well in remote past prior to surgery years ago.  Rx diflucan for yeast infection if gets.  Stop afrin to avoid rebound nasal congestion.  Follow up 10 days or as needed.  Counseled on doing covid test if signs and symptoms change as discussed.
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
## 2019-09-29 IMAGING — CR DG CHEST 2V
2 series · 2 of 2 positions shown · non-contrast
Comparison: None.

CLINICAL DATA: Central chest pain

EXAM:
CHEST  2 VIEW

[chest lat]
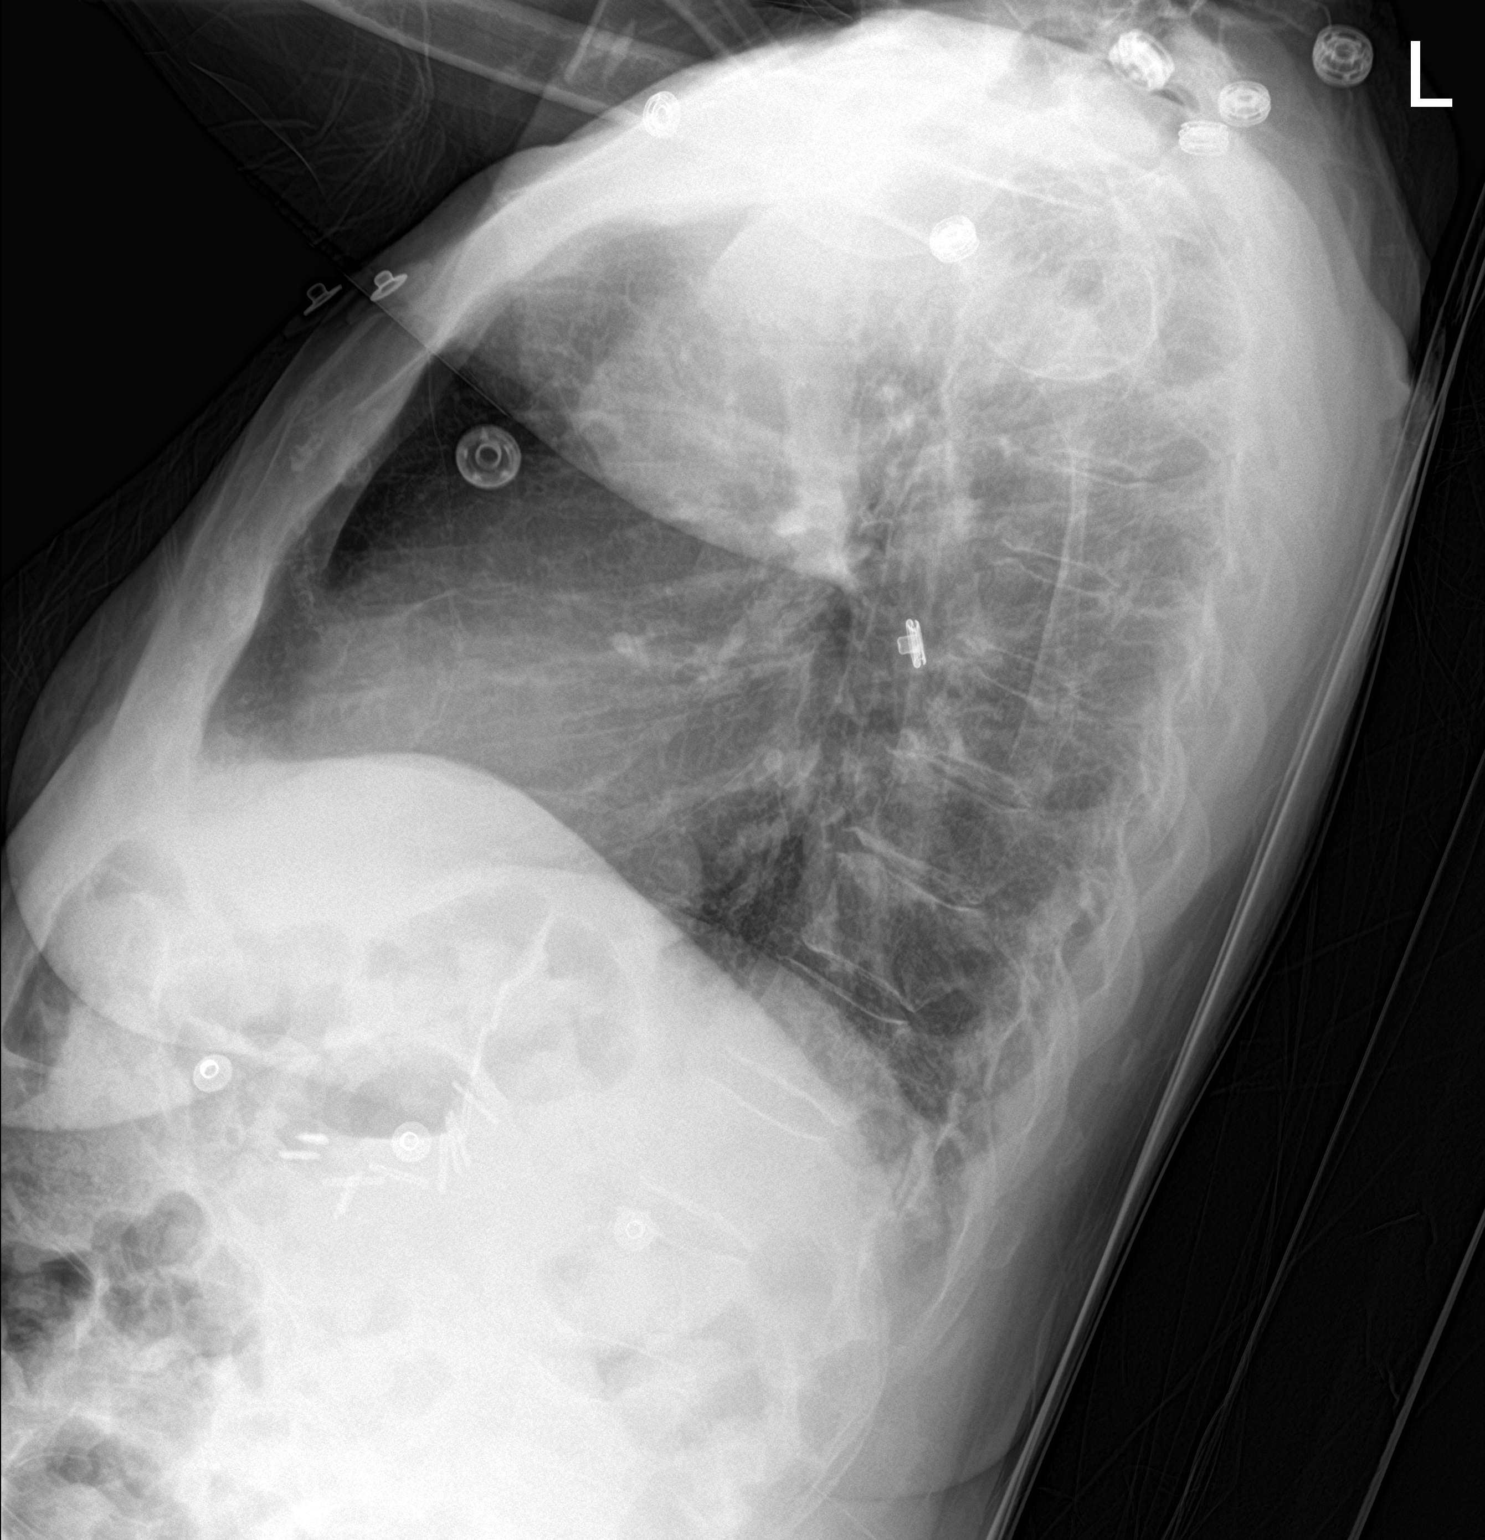

[chest ap]
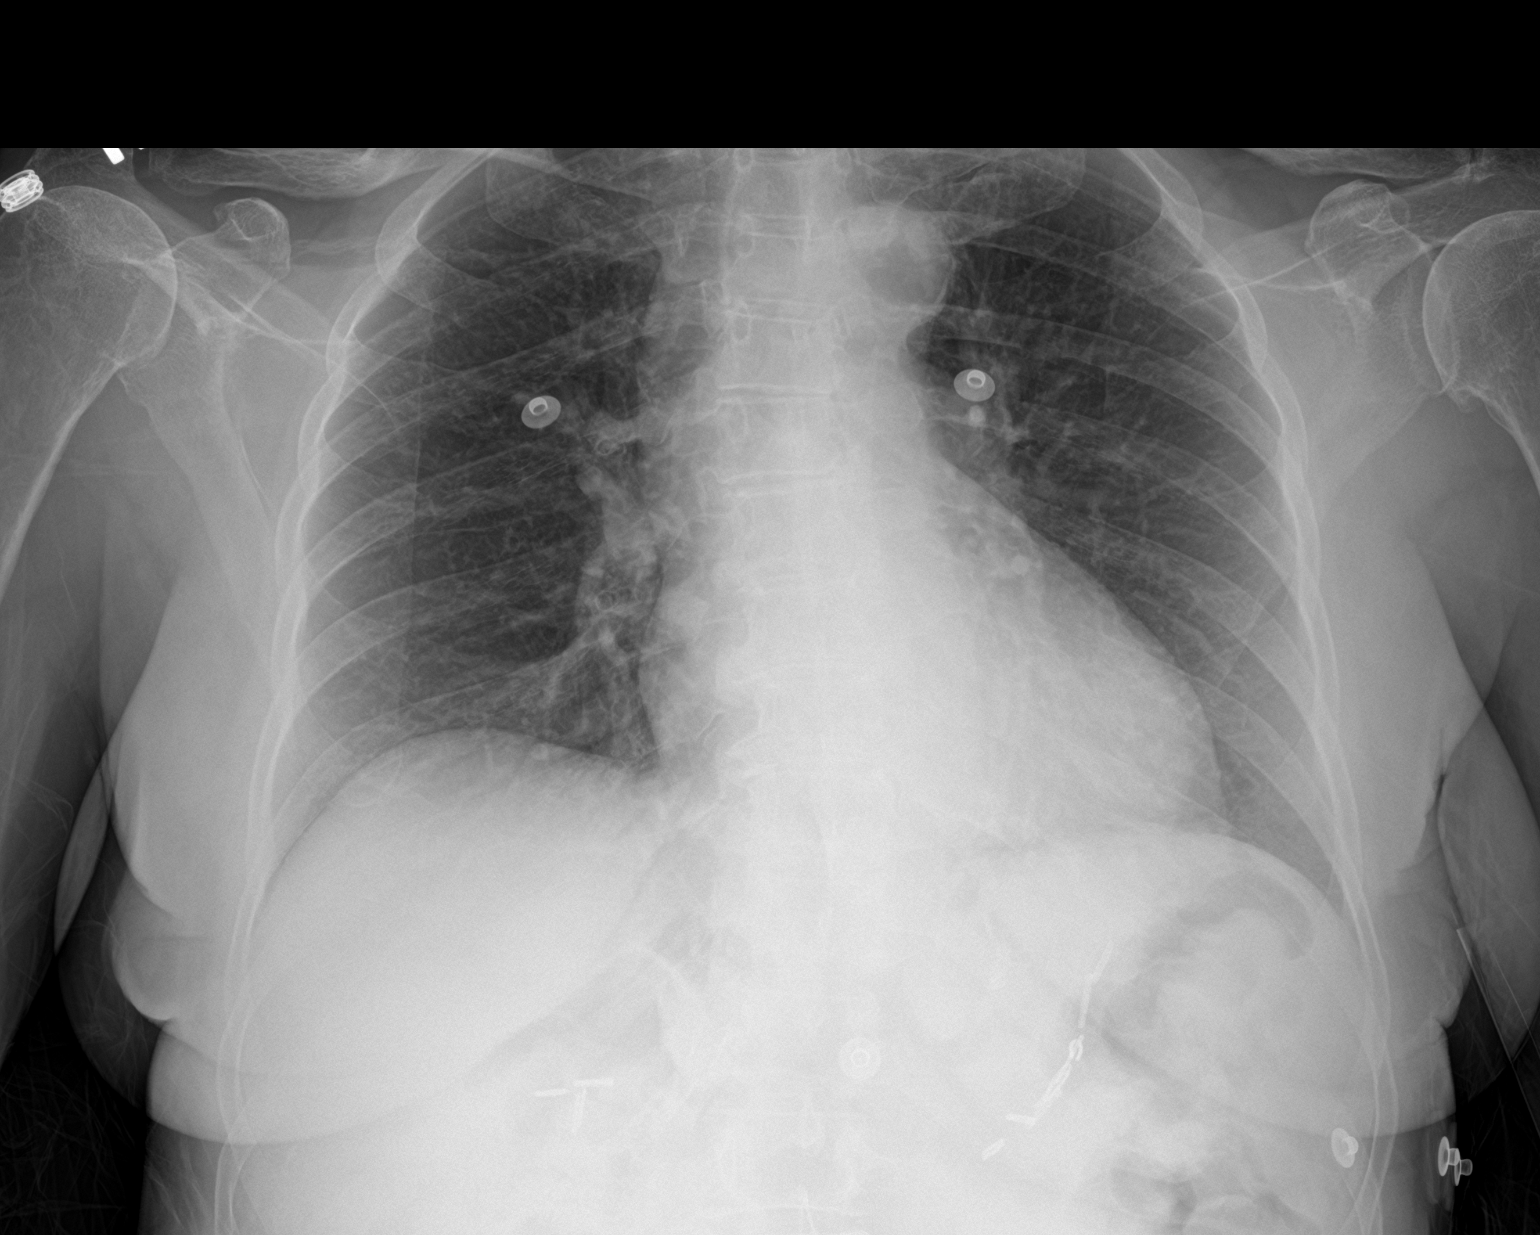

[2 of 2 positions shown; findings below may reference images not displayed]

FINDINGS: Heart is borderline enlarged. There is thoracic aortic
atherosclerosis at the arch. No pneumonic consolidation or
pneumothorax. No pulmonary edema nor effusion. Old right mid
clavicular fracture with healing. Osteoarthritis of the left
glenohumeral joint with inferomedial spurring of the included left
humeral head. Surgical clips are seen in the upper abdomen
bilaterally.
IMPRESSION: 1. No active cardiopulmonary disease.
2. Aortic atherosclerosis.

## 2019-10-21 ENCOUNTER — Other Ambulatory Visit: Payer: Self-pay | Admitting: Family Medicine

## 2019-12-17 ENCOUNTER — Telehealth: Payer: Commercial Managed Care - PPO | Admitting: Family Medicine

## 2019-12-17 ENCOUNTER — Encounter: Payer: Self-pay | Admitting: Family Medicine

## 2019-12-17 NOTE — Progress Notes (Unsigned)
Sent video link and contacted patient via phone at the time of her scheduled virtual visit. Received voice message recording and LM letting her know I was trying to reach her. Advised to log into the video visit or contact PCP office to either assist, reschedule or cancel if no longer needed appointment. Let her know I would wait in virtual space for her for until 10 minutes passed the appointment time, then would have to move on to the next patient.

## 2019-12-18 ENCOUNTER — Telehealth (INDEPENDENT_AMBULATORY_CARE_PROVIDER_SITE_OTHER): Payer: Commercial Managed Care - PPO | Admitting: Medical

## 2019-12-18 ENCOUNTER — Other Ambulatory Visit: Payer: Self-pay

## 2019-12-18 DIAGNOSIS — J019 Acute sinusitis, unspecified: Secondary | ICD-10-CM

## 2019-12-18 MED ORDER — DOXYCYCLINE HYCLATE 100 MG PO TABS: 100.0000 mg | ORAL_TABLET | Freq: Two times a day (BID) | ORAL | 0 refills | Status: DC

## 2019-12-18 NOTE — Progress Notes (Signed)
   Subjective:    Patient ID: Jennifer Sandoval, female    DOB: 1955-07-07, 64 y.o.   MRN: [id number]  HPI   Virtual Visit via Video Note  I connected with Jennifer Sandoval on 12/18/19 at 11:20 AM EST by a video enabled telemedicine application and verified that I am speaking with the correct person using two identifiers.  Location: Patient: home Provider: office   I discussed the limitations of evaluation and management by telemedicine and the availability of in person appointments. The patient expressed understanding and agreed to proceed.  History of Present Illness:   Pt in nasal congestion, sinus pressure and mild cough. Patient has history of intermittent sinus infections about every 3 months. She historically does well with course of doxycycline and expresses preference for doxycycline the past since it works consistently. She does have Flonase available to use if needed. Expresses that she does not need anything for cough.  On review not reporting any fevers, chills, wheezing or shortness of breath. No reported any change in smell or myalgias. No chest congestion reported. Patient expresses confidence that this is her standard sinus infection which she gets quite frequently.  Observations/Objective: General-no acute distress, pleasant, oriented. Lungs- on inspection lungs appear unlabored. Neck- no tracheal deviation or jvd on inspection. Neuro- gross motor function appears intact. HEENT-self palpation frontal and maxillary sinuses are tender.  Assessment and Plan: Probable recurrent sinus infection as she does have this frequently. Did prescribe doxycycline 14-day course as she has done well with that in the past. She does have Flonase available to use if needed for nasal congestion. Current declines medication for cough but if needed can send in benzonatate or other cough medicine later.  If symptoms symptoms worsen or change please let me know.  Follow-up in 7 to 10 days or as  needed.  Mackie Pai, PA-C  Follow Up Instructions:    I discussed the assessment and treatment plan with the patient. The patient was provided an opportunity to ask questions and all were answered. The patient agreed with the plan and demonstrated an understanding of the instructions.   The patient was advised to call back or seek an in-person evaluation if the symptoms worsen or if the condition fails to improve as anticipated.  Time spent with patient today was 15  minutes which consisted of chart review, discussing diagnosis, work up, treatment and documentation.   Mackie Pai, PA-C    Review of Systems     Objective:   Physical Exam        Assessment & Plan:

## 2019-12-18 NOTE — Patient Instructions (Signed)
Probable recurrent sinus infection as she does have this frequently. Did prescribe doxycycline 14-day course as she has done well with that in the past. She does have Flonase available to use if needed for nasal congestion. Current declines medication for cough but if needed can send in benzonatate or other cough medicine later.  If symptoms symptoms worsen or change please let me know.  Follow-up in 7 to 10 days or as needed.

## 2020-01-13 ENCOUNTER — Other Ambulatory Visit: Payer: Self-pay | Admitting: Family Medicine

## 2020-02-22 ENCOUNTER — Encounter: Payer: Self-pay | Admitting: Family Medicine

## 2020-02-22 ENCOUNTER — Other Ambulatory Visit: Payer: Self-pay

## 2020-02-22 ENCOUNTER — Telehealth (INDEPENDENT_AMBULATORY_CARE_PROVIDER_SITE_OTHER): Payer: Commercial Managed Care - PPO | Admitting: Family Medicine

## 2020-02-22 VITALS — Temp 98.0°F

## 2020-02-22 DIAGNOSIS — B9689 Other specified bacterial agents as the cause of diseases classified elsewhere: Secondary | ICD-10-CM | POA: Diagnosis not present

## 2020-02-22 DIAGNOSIS — J019 Acute sinusitis, unspecified: Secondary | ICD-10-CM | POA: Diagnosis not present

## 2020-02-22 MED ORDER — FLUCONAZOLE 150 MG PO TABS: ORAL_TABLET | ORAL | 0 refills | Status: DC

## 2020-02-22 MED ORDER — DOXYCYCLINE HYCLATE 100 MG PO TABS: 100.0000 mg | ORAL_TABLET | Freq: Two times a day (BID) | ORAL | 0 refills | Status: DC

## 2020-02-22 NOTE — Progress Notes (Signed)
Chief Complaint  Patient presents with  . Sinusitis  . Eye Drainage    Jennifer Sandoval here for URI complaints. Due to COVID-19 pandemic, we are interacting via web portal for an electronic face-to-face visit. I verified patient's ID using 2 identifiers. Patient agreed to proceed with visit via this method. Patient is at home, I am at office. Patient and I are present for visit.   Duration: 2 weeks  Associated symptoms: sinus headache, sinus congestion, sinus pain, rhinorrhea, itchy watery eyes and coughing Denies: ear pain, sore throat, wheezing, shortness of breath, myalgia and fevers, N/V/d Treatment to date: INCS Sick contacts: No  Past Medical History:  Diagnosis Date  . Acute sinusitis 07/31/2006  . ALLERGIC RHINITIS 08/15/2006  . Allergy    rhinitis  . Anemia    iron deficeincy  . ANEMIA-NOS 10/24/2006  . BASAL CELL CARCINOMA SKIN LOWER LIMB INCL HIP 02/15/2010  . BCC (basal cell carcinoma of skin) 07/10/2011   h/o  . Depression   . DEPRESSION 10/24/2006  . 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 Dry eyes, bilateral 01/30/2016  . Dyspepsia 04/19/2010  . FRACTURE, NOSE 10/22/2007  . History of gestational diabetes 01/30/2016  . Insomnia 01/23/2012  . Leiomyoma of uterus, unspecified 02/15/2010  . Lumbar compression fracture ([place])    nonsurgical  . OSTEOARTHRITIS 10/24/2006  . PERIMENOPAUSAL STATUS 03/16/2010  . Post-menopause 03/16/2010   Qualifier: Diagnosis of  By: Charlett Blake MD, Erline Levine    . Preventative health care 08/31/2012  . SHOULDER PAIN, BILATERAL 10/10/2007    Temp 98 F (36.7 C) (Oral)  No conversational dyspnea Age appropriate judgment and insight Nml affect and mood  Acute bacterial sinusitis - Plan: fluconazole (DIFLUCAN) 150 MG tablet, doxycycline (VIBRA-TABS) 100 MG tablet  Doxy given >10 d of s/s's. She has seen ENT before and does not wish to take daily allergy medications at this time.  Continue to push fluids, practice good hand hygiene, cover mouth when  coughing. F/u prn. If starting to experience fevers, shaking, or shortness of breath, seek immediate care. Pt voiced understanding and agreement to the plan.  [place], DO 02/22/20 10:27 AM

## 2020-04-11 ENCOUNTER — Other Ambulatory Visit: Payer: Self-pay | Admitting: Family Medicine

## 2020-05-27 ENCOUNTER — Other Ambulatory Visit: Payer: Self-pay

## 2020-05-27 ENCOUNTER — Telehealth (INDEPENDENT_AMBULATORY_CARE_PROVIDER_SITE_OTHER): Payer: No Typology Code available for payment source | Admitting: Family Medicine

## 2020-05-27 ENCOUNTER — Encounter: Payer: Self-pay | Admitting: Family Medicine

## 2020-05-27 DIAGNOSIS — B9689 Other specified bacterial agents as the cause of diseases classified elsewhere: Secondary | ICD-10-CM | POA: Diagnosis not present

## 2020-05-27 DIAGNOSIS — J019 Acute sinusitis, unspecified: Secondary | ICD-10-CM

## 2020-05-27 MED ORDER — DOXYCYCLINE HYCLATE 100 MG PO TABS: 100.0000 mg | ORAL_TABLET | Freq: Two times a day (BID) | ORAL | 0 refills | Status: AC

## 2020-05-27 MED ORDER — FLUCONAZOLE 150 MG PO TABS: ORAL_TABLET | ORAL | 0 refills | Status: DC

## 2020-05-27 NOTE — Progress Notes (Signed)
Chief Complaint  Patient presents with  . Sinusitis    Jennifer Sandoval here for URI complaints. Due to COVID-19 pandemic, we are interacting via web portal for an electronic face-to-face visit. I verified patient's ID using 2 identifiers. Patient agreed to proceed with visit via this method. Patient is at home, I am at office. Patient and I are present for visit.   Duration: 2 weeks  Associated symptoms: sinus headache, sinus congestion, sinus pain, rhinorrhea and slight cough Denies: itchy watery eyes, ear fullness, ear pain, ear drainage, sore throat, wheezing, shortness of breath, myalgia and fevers Treatment to date: Mucinex, Afrin Sick contacts: No  Past Medical History:  Diagnosis Date  . Acute sinusitis 07/31/2006  . ALLERGIC RHINITIS 08/15/2006  . Allergy    rhinitis  . Anemia    iron deficeincy  . ANEMIA-NOS 10/24/2006  . BASAL CELL CARCINOMA SKIN LOWER LIMB INCL HIP 02/15/2010  . BCC (basal cell carcinoma of skin) 07/10/2011   h/o  . Depression   . DEPRESSION 10/24/2006  . 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 Dry eyes, bilateral 01/30/2016  . Dyspepsia 04/19/2010  . FRACTURE, NOSE 10/22/2007  . History of gestational diabetes 01/30/2016  . Insomnia 01/23/2012  . Leiomyoma of uterus, unspecified 02/15/2010  . Lumbar compression fracture ([place])    nonsurgical  . OSTEOARTHRITIS 10/24/2006  . PERIMENOPAUSAL STATUS 03/16/2010  . Post-menopause 03/16/2010   Qualifier: Diagnosis of  By: Charlett Blake MD, Erline Levine    . Preventative health care 08/31/2012  . SHOULDER PAIN, BILATERAL 10/10/2007   Objective No conversational dyspnea Age appropriate judgment and insight Nml affect and mood  Acute bacterial sinusitis - Plan: fluconazole (DIFLUCAN) 150 MG tablet, doxycycline (VIBRA-TABS) 100 MG tablet  Will tx given duration. Again does not wish to take daily allergy medication despite recurrent sinus infections.  Continue to push fluids, practice good hand hygiene. F/u prn. If starting  to experience fevers, shaking, or shortness of breath, seek immediate care. Pt voiced understanding and agreement to the plan.  [place], DO 05/27/20 11:31 AM

## 2020-10-14 ENCOUNTER — Telehealth (INDEPENDENT_AMBULATORY_CARE_PROVIDER_SITE_OTHER): Payer: No Typology Code available for payment source | Admitting: Family Medicine

## 2020-10-14 ENCOUNTER — Other Ambulatory Visit: Payer: Self-pay

## 2020-10-14 ENCOUNTER — Encounter: Payer: Self-pay | Admitting: Family Medicine

## 2020-10-14 DIAGNOSIS — J324 Chronic pansinusitis: Secondary | ICD-10-CM | POA: Insufficient documentation

## 2020-10-14 MED ORDER — FLUCONAZOLE 150 MG PO TABS: ORAL_TABLET | ORAL | 0 refills | Status: DC

## 2020-10-14 MED ORDER — DOXYCYCLINE HYCLATE 100 MG PO TABS
100.0000 mg | ORAL_TABLET | Freq: Two times a day (BID) | ORAL | 0 refills | Status: DC
Start: 2020-10-14 — End: 2020-11-23

## 2020-10-14 NOTE — Progress Notes (Signed)
MyChart Video Visit    Virtual Visit via Video Note   This visit type was conducted due to national recommendations for restrictions regarding the COVID-19 Pandemic (e.g. social distancing) in an effort to limit this patient's exposure and mitigate transmission in our community. This patient is at least at moderate risk for complications without adequate follow up. This format is felt to be most appropriate for this patient at this time. Physical exam was limited by quality of the video and audio technology used for the visit. Alinda Dooms  was able to get the patient set up on a video visit.  Patient location: Home Patient and provider in visit Provider location: Office  I discussed the limitations of evaluation and management by telemedicine and the availability of in person appointments. The patient expressed understanding and agreed to proceed.  Visit Date: 10/14/2020  Today's healthcare provider: Ann Held, DO      Subjective:    Patient ID: Jennifer Sandoval, female    DOB: August 08, 1955, 65 y.o.   MRN: [id number]  No chief complaint on file.   HPI Patient is in today for an office visit  She reports she has a sinus infection and it is a chronic issue. She normally uses doxycyline for like 2 weeks and the symptoms clear up. She uses antihistamines prn and Flonase. This current episode of the infection has lasted for the past 2 weeks and she is sore behind her eyes and head.  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   Dry eyes, bilateral 01/30/2016   Dyspepsia 04/19/2010   FRACTURE, NOSE 10/22/2007   History of gestational diabetes  01/30/2016   Insomnia 01/23/2012   Leiomyoma of uterus, unspecified 02/15/2010   Lumbar compression fracture ([place])    nonsurgical   OSTEOARTHRITIS 10/24/2006   PERIMENOPAUSAL STATUS 03/16/2010   Post-menopause 03/16/2010   Qualifier: Diagnosis of  By: Charlett Blake MD, [facility]     Preventative health care 08/31/2012   SHOULDER PAIN, BILATERAL 10/10/2007    Past Surgical History:  Procedure Laterality Date   ABDOMINAL HYSTERECTOMY  2009   partial, ovaries left in place, for painful, heavy menstrrual bleeding secondary to anemia and fibroids   APPENDECTOMY     CESAREAN SECTION     X 3   CHOLECYSTECTOMY     GASTRIC BYPASS     NASAL SINUS SURGERY     SEPTOPLASTY     skin biopsy     L hip BCC, facial lesions were benign   TUBAL LIGATION     ventral herniorrhaphy     X 2    Family History  Problem Relation Age of Onset   Cholelithiasis Mother    Other Mother        Urinary incontince/ bladder prolapse/ h/o smoking   Cancer Mother        laryngeal carcinoma   Allergies Mother    Arthritis Mother        s/p knee and shoulder replacement   Dementia Mother        lewy body   Other Father  Renal failure   Hypertension Father    Coronary artery disease Father        s/p bypass   Aortic aneurysm Father    Hyperlipidemia Father    Heart disease Father 22       quadruple bypass   Spina bifida Brother        with stunt/ self cath   Seizures Brother        disorders   ADD / ADHD Daughter    Cancer Maternal Grandmother        colon/ smoker   Cancer Maternal Grandfather        lung   Cancer Paternal Grandmother        Breast   Heart disease Paternal Grandfather    Stroke Paternal Grandfather    ADD / ADHD Daughter     Social History   Socioeconomic History   Marital status: Married    Spouse name: Not on file   Number of children: Not on file   Years of education: Not on file   Highest education level: Not on file  Occupational History   Not on file  Tobacco Use    Smoking status: Never   Smokeless tobacco: Never  Substance and Sexual Activity   Alcohol use: Yes    Comment: special occasion   Drug use: No   Sexual activity: Yes    Partners: Male  Other Topics Concern   Not on file  Social History Narrative   Not on file   Social Determinants of Health   Financial Resource Strain: Not on file  Food Insecurity: Not on file  Transportation Needs: Not on file  Physical Activity: Not on file  Stress: Not on file  Social Connections: Not on file  Intimate Partner Violence: Not on file    Outpatient Medications Prior to Visit  Medication Sig Dispense Refill   beclomethasone (QVAR REDIHALER) 40 MCG/ACT inhaler Inhale 2 puffs into the lungs 2 (two) times daily. 1 Inhaler 2   Biotin 10 MG CAPS Take 1 tablet by mouth daily.     fluconazole (DIFLUCAN) 150 MG tablet Take 1 tab, repeat in 48 hours if no improvement. 2 tablet 0   fluticasone (FLONASE) 50 MCG/ACT nasal spray Place 2 sprays into both nostrils daily. 16 mL 5   L-LYSINE PO Take by mouth.     levocetirizine (XYZAL) 5 MG tablet Take 1 tablet (5 mg total) by mouth every evening. 30 tablet 3   Melatonin 3 MG CAPS Take by mouth.     montelukast (SINGULAIR) 5 MG chewable tablet 2 tab po q day 60 tablet 3   Omega-3 Krill Oil 1000 MG CAPS Take by mouth daily.     Probiotic Product (PROBIOTIC DAILY PO) Take 1 capsule by mouth daily.     sertraline (ZOLOFT) 100 MG tablet Take 1.5 tablets (150 mg total) by mouth daily. 135 tablet 1   zinc gluconate 50 MG tablet Take 50 mg by mouth daily.     No facility-administered medications prior to visit.    Allergies  Allergen Reactions   Itraconazole     REACTION: Rash   Levaquin [Levofloxacin In D5w]     Joint pain    Review of Systems  Constitutional:  Negative for chills, fever and malaise/fatigue.  HENT:  Positive for congestion and sinus pain. Negative for hearing loss, nosebleeds and sore throat.   Eyes:  Negative for discharge.   Respiratory:  Negative for cough, sputum production, shortness of  breath and wheezing.   Cardiovascular:  Negative for chest pain, palpitations and leg swelling.  Gastrointestinal:  Negative for abdominal pain, blood in stool, constipation, diarrhea, heartburn, nausea and vomiting.  Genitourinary:  Negative for dysuria, frequency, hematuria and urgency.  Musculoskeletal:  Negative for back pain, falls and myalgias.  Skin:  Negative for rash.  Neurological:  Negative for dizziness, sensory change, loss of consciousness, weakness and headaches.  Endo/Heme/Allergies:  Negative for environmental allergies. Does not bruise/bleed easily.  Psychiatric/Behavioral:  Negative for depression and suicidal ideas. The patient is not nervous/anxious and does not have insomnia.       Objective:    Physical Exam Vitals and nursing note reviewed.  Constitutional:      Appearance: Normal appearance. She is well-developed.  Neck:     Thyroid: No thyromegaly.     Vascular: No JVD.  Pulmonary:     Effort: Pulmonary effort is normal.     Breath sounds: No rales.  Chest:     Chest wall: No tenderness.  Neurological:     Mental Status: She is alert and oriented to person, place, and time.  Psychiatric:        Mood and Affect: Mood normal.        Behavior: Behavior normal.    There were no vitals taken for this visit. Wt Readings from Last 3 Encounters:  11/14/18 128 lb (58.1 kg)  10/13/18 130 lb (59 kg)  01/14/18 123 lb 12.8 oz (56.2 kg)    Diabetic Foot Exam - Simple   No data filed    Lab Results  Component Value Date   WBC 9.4 11/01/2016   HGB 12.5 11/01/2016   HCT 39.0 11/01/2016   PLT 235 11/01/2016   GLUCOSE 94 11/01/2016   CHOL 132 01/30/2016   TRIG 95.0 01/30/2016   HDL 67.70 01/30/2016   LDLCALC 45 01/30/2016   ALT 37 (H) 01/30/2016   AST 34 01/30/2016   NA 139 11/01/2016   K 3.9 11/01/2016   CL 102 11/01/2016   CREATININE 0.81 11/01/2016   BUN 12 11/01/2016   CO2 29  11/01/2016   TSH 1.33 01/30/2016   INR 1.1 02/25/2007   HGBA1C 6.0 10/10/2011    Lab Results  Component Value Date   TSH 1.33 01/30/2016   Lab Results  Component Value Date   WBC 9.4 11/01/2016   HGB 12.5 11/01/2016   HCT 39.0 11/01/2016   MCV 89.7 11/01/2016   PLT 235 11/01/2016   Lab Results  Component Value Date   NA 139 11/01/2016   K 3.9 11/01/2016   CO2 29 11/01/2016   GLUCOSE 94 11/01/2016   BUN 12 11/01/2016   CREATININE 0.81 11/01/2016   BILITOT 0.3 01/30/2016   ALKPHOS 84 01/30/2016   AST 34 01/30/2016   ALT 37 (H) 01/30/2016   PROT 6.5 01/30/2016   ALBUMIN 4.3 01/30/2016   CALCIUM 9.2 11/01/2016   ANIONGAP 8 11/01/2016   GFR 90.93 01/30/2016   Lab Results  Component Value Date   CHOL 132 01/30/2016   Lab Results  Component Value Date   HDL 67.70 01/30/2016   Lab Results  Component Value Date   LDLCALC 45 01/30/2016   Lab Results  Component Value Date   TRIG 95.0 01/30/2016   Lab Results  Component Value Date   CHOLHDL 2 01/30/2016   Lab Results  Component Value Date   HGBA1C 6.0 10/10/2011       Assessment & Plan:   Problem  List Items Addressed This Visit       Unprioritized   Pansinusitis - Primary   Relevant Medications   doxycycline (VIBRA-TABS) 100 MG tablet   fluconazole (DIFLUCAN) 150 MG tablet  Con't with saline spray and antihistamine Fu prn   Meds ordered this encounter  Medications   doxycycline (VIBRA-TABS) 100 MG tablet    Sig: Take 1 tablet (100 mg total) by mouth 2 (two) times daily.    Dispense:  28 tablet    Refill:  0   fluconazole (DIFLUCAN) 150 MG tablet    Sig: 1 po x1, may repeat in 3 days prn    Dispense:  2 tablet    Refill:  0    I discussed the assessment and treatment plan with the patient. The patient was provided an opportunity to ask questions and all were answered. The patient agreed with the plan and demonstrated an understanding of the instructions.   The patient was advised to call  back or seek an in-person evaluation if the symptoms worsen or if the condition fails to improve as anticipated.  I provided 20 minutes of face-to-face time during this encounter.   I,Zite Okoli,acting as a Education administrator for [employer], DO.,have documented all relevant documentation on the behalf of Ann Held, DO,as directed by  Ann Held, DO while in the presence of [place], DO, have reviewed all documentation for this visit. The documentation on 10/14/20 for the exam, diagnosis, procedures, and orders are all accurate and complete.   Ann Held, DO [place] at [employer] [phone] (phone) [phone] (fax)  [place]

## 2020-10-23 ENCOUNTER — Other Ambulatory Visit: Payer: Self-pay | Admitting: Family Medicine

## 2020-11-18 ENCOUNTER — Other Ambulatory Visit: Payer: Self-pay | Admitting: Family Medicine

## 2020-11-22 NOTE — Progress Notes (Signed)
[place] at [facility] [address] [place], [place] [address] [phone]  Date:  11/23/2020   Name:  Adelita Hone   DOB:  1955-10-14   MRN:  [id number]  PCP:  Mosie Lukes, MD    Chief Complaint: Dizziness (Pt says she had 1 other spell years ago. Sxs started 5 days ago. )   History of Present Illness:  Clorinda Wyble is a 65 y.o. very pleasant female patient who presents with the following:  Virtual visit today for concern of vertigo Pt of Dr Charlett Blake whom I have not seen in the past Pt location is home, my location is office Pt ID confirmed with 2 factors, she gives consent for a virtual visit today  The patient myself are present on the call  She is generally in good health, history of frequent sinus issues, gestational diabetes  Today pt notes concern of dizziness- vertigo type dizziness, described as a spinning sensation.   She notes it when she changes position- she did have this once in the past approximately 20 years ago, she remembers it resolved without incident Sx for about 4-5 days now- getting a little bit better She will notice vertigo if she gets up from lying down or lies down from standing, if she rolls over in bed, if she bends down.  Symptoms occur immediately with head movement, resolves within a few seconds when she holds still again She notes dizziness especially if she rolls over in bed towards the left  No unusual headaches, no tinnitus, no vision or hearing change.  No other neurologic symptoms such as numbness or weakness of any part of her body, no slurred speech She is not checking vital signs at home   Patient Active Problem List   Diagnosis Date Noted   Pansinusitis 10/14/2020   History of gestational diabetes 01/30/2016   H/O gastric bypass 01/30/2016   Dry eyes, bilateral 01/30/2016   Acute bacterial sinusitis 09/29/2014   High serum phosphorus for age 75/27/2015   Palpitations 06/14/2013    Preventative health care 08/31/2012   Insomnia 01/23/2012   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   Dyspepsia 04/19/2010   Post-menopause 03/16/2010   BASAL CELL CARCINOMA SKIN LOWER LIMB INCL HIP 02/15/2010   LEIOMYOMA OF UTERUS, UNSPECIFIED 02/15/2010   FRACTURE, NOSE 10/22/2007   Neck and shoulder pain 10/10/2007   Anemia 10/24/2006   Depression with anxiety 10/24/2006   OSTEOARTHRITIS 10/24/2006   Allergic rhinitis 08/15/2006    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   Dry eyes, bilateral 01/30/2016   Dyspepsia 04/19/2010   FRACTURE, NOSE 10/22/2007   History of gestational diabetes 01/30/2016   Insomnia 01/23/2012   Leiomyoma of uterus, unspecified 02/15/2010   Lumbar compression fracture ([place])    nonsurgical   OSTEOARTHRITIS 10/24/2006   PERIMENOPAUSAL STATUS 03/16/2010   Post-menopause 03/16/2010   Qualifier: Diagnosis of  By: Charlett Blake MD, [facility]     Preventative health care 08/31/2012   SHOULDER PAIN, BILATERAL 10/10/2007    Past Surgical History:  Procedure Laterality Date   ABDOMINAL  HYSTERECTOMY  2009   partial, ovaries left in place, for painful, heavy menstrrual bleeding secondary to anemia and fibroids   APPENDECTOMY     CESAREAN SECTION     X 3   CHOLECYSTECTOMY     GASTRIC BYPASS     NASAL SINUS SURGERY     SEPTOPLASTY     skin biopsy     L hip BCC, facial lesions were benign   TUBAL LIGATION     ventral herniorrhaphy     X 2    Social History   Tobacco Use   Smoking status: Never   Smokeless tobacco: Never  Substance Use Topics   Alcohol use: Yes    Comment: special occasion   Drug use: No    Family History   Problem Relation Age of Onset   Cholelithiasis Mother    Other Mother        Urinary incontince/ bladder prolapse/ h/o smoking   Cancer Mother        laryngeal carcinoma   Allergies Mother    Arthritis Mother        s/p knee and shoulder replacement   Dementia Mother        lewy body   Other Father        Renal failure   Hypertension Father    Coronary artery disease Father        s/p bypass   Aortic aneurysm Father    Hyperlipidemia Father    Heart disease Father 67       quadruple bypass   Spina bifida Brother        with stunt/ self cath   Seizures Brother        disorders   ADD / ADHD Daughter    Cancer Maternal Grandmother        colon/ smoker   Cancer Maternal Grandfather        lung   Cancer Paternal Grandmother        Breast   Heart disease Paternal Grandfather    Stroke Paternal Grandfather    ADD / ADHD Daughter     Allergies  Allergen Reactions   Itraconazole     REACTION: Rash   Levaquin [Levofloxacin In D5w]     Joint pain    Medication list has been reviewed and updated.  Current Outpatient Medications on File Prior to Visit  Medication Sig Dispense Refill   sertraline (ZOLOFT) 100 MG tablet TAKE 1.5 TABLETS BY MOUTH DAILY 135 tablet 1   No current facility-administered medications on file prior to visit.    Review of Systems:  As per HPI- otherwise negative.   Physical Examination: Vitals:   11/23/20 0907  Resp: 18   Vitals:   11/23/20 0907  Weight: 130 lb (59 kg)  Height: 5\' 2"  (1.575 m)   Body mass index is 23.78 kg/m. Ideal Body Weight: Weight in (lb) to have BMI = 25: 136.4  Patient observed her video monitor.  She looks well, no slurred speech or facial droop.  No distress or shortness of breath is noted  Assessment and Plan: Benign paroxysmal positional vertigo, unspecified laterality - Plan: meclizine (ANTIVERT) 12.5 MG tablet  Virtual visit today for likely BPPV.  Discussed with patient, likely benign course of  illness.  She can try Epley maneuvers if she wishes-can look up and instructional YouTube video online  Otherwise discussed safety, do not drive if feeling dizzy.  Stay off of ladders, etc.  Prescribed meclizine which she  can use if needed for symptoms  I asked her to let us know if not feeling better in the next few days, sooner if getting worse  If any other neurologic symptoms or severe headache go to the ER  Video used for duration of visit today  Signed Lamar Blinks, MD

## 2020-11-23 ENCOUNTER — Telehealth (INDEPENDENT_AMBULATORY_CARE_PROVIDER_SITE_OTHER): Payer: No Typology Code available for payment source | Admitting: Family Medicine

## 2020-11-23 ENCOUNTER — Other Ambulatory Visit: Payer: Self-pay

## 2020-11-23 VITALS — Resp 18 | Ht 62.0 in | Wt 130.0 lb

## 2020-11-23 DIAGNOSIS — H8110 Benign paroxysmal vertigo, unspecified ear: Secondary | ICD-10-CM | POA: Diagnosis not present

## 2020-11-23 MED ORDER — MECLIZINE HCL 12.5 MG PO TABS: 12.5000 mg | ORAL_TABLET | Freq: Three times a day (TID) | ORAL | 0 refills | Status: DC | PRN

## 2020-12-22 ENCOUNTER — Telehealth (INDEPENDENT_AMBULATORY_CARE_PROVIDER_SITE_OTHER): Payer: No Typology Code available for payment source | Admitting: Medical

## 2020-12-22 DIAGNOSIS — J0100 Acute maxillary sinusitis, unspecified: Secondary | ICD-10-CM

## 2020-12-22 MED ORDER — DOXYCYCLINE HYCLATE 100 MG PO TABS: 100.0000 mg | ORAL_TABLET | Freq: Two times a day (BID) | ORAL | 0 refills | Status: DC

## 2020-12-22 NOTE — Progress Notes (Signed)
° °  Subjective:    Patient ID: Jennifer Sandoval, female    DOB: [birth year], 65 y.o.   MRN: [id number]  HPI  Virtual Visit via Video Note  I connected with [name] on 12/22/20 at 11:20 AM EST by a video enabled telemedicine application and verified that I am speaking with the correct person using two identifiers.  Location: Patient: home Provider: home   I discussed the limitations of evaluation and management by telemedicine and the availability of in person appointments. The patient expressed understanding and agreed to proceed.  History of Present Illness:  Pt has had 12 days of nasal congestion that gradually got worse and now has sinus pressure and now when blowing nose get thick colored mucus.   Hx of chronic sinus infections.   Pt has hx of allergies. She also has hx of deviated septum with repair years ago late 20's. She mentions years ago falling off horse and hitting nose so she wonders if septum again deviated.   Pt typically does well with 14 days of doxy.      Observations/Objective:  General-no acute distress, pleasant, oriented. Lungs- on inspection lungs appear unlabored. Neck- no tracheal deviation or jvd on inspection. Neuro- gross motor function appears intact.  Heent- frontal and maxillary tenderness to palpation bilaterally. More in maxillary sinus   Assessment and Plan: Patient Instructions  You appear to have  recurrent sinus infection. I am prescribing  doxycycline antibiotic for the infection. To help with the nasal congestion can use saline nasal spray. Can use mucinex otc.  Making benzonate available for cough if needed.  Rest, hydrate, tylenol for fever.  Follow up in 7 days or as needed.    Mackie Pai, PA-C   Follow Up Instructions:    I discussed the assessment and treatment plan with the patient. The patient was provided an opportunity to ask questions and all were answered. The patient agreed with the plan and demonstrated an  understanding of the instructions.   The patient was advised to call back or seek an in-person evaluation if the symptoms worsen or if the condition fails to improve as anticipated.  Time spent with patient today was 10  minutes which consisted of chart revdiew, discussing diagnosis, work up treatment and documentation.    Mackie Pai, PA-C   Review of Systems     Objective:   Physical Exam        Assessment & Plan:

## 2020-12-22 NOTE — Patient Instructions (Addendum)
You appear to have recurrent sinus infection. I am prescribing  doxycycline antibiotic for the infection. To help with the nasal congestion can use saline nasal spray. Can use mucinex otc.  Making benzonatate available for cough if needed.  Rest, hydrate, tylenol for fever.  Follow up in 7 days or as needed.

## 2021-02-13 ENCOUNTER — Telehealth (HOSPITAL_COMMUNITY): Payer: Self-pay | Admitting: *Deleted

## 2021-02-13 NOTE — Telephone Encounter (Signed)
called LVM requesting call back  & stated that she needed a copy of the email mentioning stopping medication.Marland KitchenMarland Kitchen

## 2021-03-03 ENCOUNTER — Telehealth (INDEPENDENT_AMBULATORY_CARE_PROVIDER_SITE_OTHER): Payer: No Typology Code available for payment source | Admitting: Medical

## 2021-03-03 DIAGNOSIS — J0100 Acute maxillary sinusitis, unspecified: Secondary | ICD-10-CM

## 2021-03-03 MED ORDER — DOXYCYCLINE HYCLATE 100 MG PO TABS: 100.0000 mg | ORAL_TABLET | Freq: Two times a day (BID) | ORAL | 0 refills | Status: DC

## 2021-03-03 NOTE — Patient Instructions (Addendum)
You appear to have recurrent sinus infection. I am prescribing  doxycycline antibiotic for the infection. To help with the nasal congestion can use saline nasal spray. Can use mucinex otc. ? ?If signs symptoms worsen or change let us know.  Hopefully will resolve like it typically has done in the past with course of doxycycline. ? ?With spring approaching and pollen likely to fall could add on steroid over-the-counter nasal spray if needed. ? ? ?Follow-up as regular scheduled with PCP or as needed. ? ? ?

## 2021-03-03 NOTE — Progress Notes (Signed)
? ?  Subjective:  ? ? Patient ID: Jennifer Sandoval, female    DOB: 1955/06/03, 66 y.o.   MRN: [id number] ? ?HPI ?Virtual Visit via Video Note ? ?I connected with [name] on 03/03/21 at  2:20 PM EST by a video enabled telemedicine application and verified that I am speaking with the correct person using two identifiers. ? ?Location: ?Patient: home ?Provider: office ?  ?I discussed the limitations of evaluation and management by telemedicine and the availability of in person appointments. The patient expressed understanding and agreed to proceed. ? ?History of Present Illness: ? ? Pt has had 8 days of nasal congestion that gradually got worse and now has sinus pressure with  colored mucus.  ?  ?Hx of chronic recurrent  sinus infections.  ?  ?Pt has hx of allergies. She also has hx of deviated septum with repair years ago late 20's. She mentions years ago falling off horse and hitting nose so she wonders if septum again deviated. ?  ?  ?Pt typically does well with 14 days of doxy. ? ?Blowing nose and getting colored mucus. No fever, no chills or sweats. ? ? ?Observations/Objective: ?General-no acute distress, pleasant, oriented. ?Lungs- on inspection lungs appear unlabored. ?Neck- no tracheal deviation or jvd on inspection. ?Neuro- gross motor function appears intact.  ? ?Heent- left side frontal and maxillary sinus tenderness. ? ?Assessment and Plan: ?Patient Instructions  ?You appear to have recurrent sinus infection. I am prescribing  doxycycline antibiotic for the infection. To help with the nasal congestion can use saline nasal spray. Can use mucinex otc. ? ?If signs symptoms worsen or change let us know.  Hopefully will resolve like it typically has done in the past with course of doxycycline. ? ?With spring approaching and pollen likely to fall could add on steroid over-the-counter nasal spray if needed. ? ? ?Follow-up as regular scheduled with PCP or as needed. ? ?  ?Mackie Pai, PA-C  ? ?Time spent with patient  today was  11 minutes which consisted of chart revdiew, discussing diagnosis, work up treatment and documentation.  ? ?Follow Up Instructions: ? ?  ?I discussed the assessment and treatment plan with the patient. The patient was provided an opportunity to ask questions and all were answered. The patient agreed with the plan and demonstrated an understanding of the instructions. ?  ?The patient was advised to call back or seek an in-person evaluation if the symptoms worsen or if the condition fails to improve as anticipated. ? ? ? ? ?Mackie Pai, PA-C  ? ? ?Review of Systems ? ?   ?Objective:  ? Physical Exam ? ? ? ? ?   ?Assessment & Plan:  ? ? ?

## 2021-04-10 ENCOUNTER — Encounter: Payer: Self-pay | Admitting: Family Medicine

## 2021-04-10 ENCOUNTER — Ambulatory Visit: Payer: No Typology Code available for payment source | Admitting: Family Medicine

## 2021-04-10 VITALS — BP 112/80 | HR 84 | Temp 98.1°F | Resp 18 | Ht 62.0 in | Wt 134.2 lb

## 2021-04-10 DIAGNOSIS — J0141 Acute recurrent pansinusitis: Secondary | ICD-10-CM | POA: Diagnosis not present

## 2021-04-10 MED ORDER — PREDNISONE 10 MG PO TABS: ORAL_TABLET | ORAL | 0 refills | Status: DC

## 2021-04-10 MED ORDER — AMOXICILLIN-POT CLAVULANATE 875-125 MG PO TABS: 1.0000 | ORAL_TABLET | Freq: Two times a day (BID) | ORAL | 0 refills | Status: DC

## 2021-04-10 NOTE — Progress Notes (Signed)
? ?Subjective:  ? ?By signing my name below, I, [name], attest that this documentation has been prepared under the direction and in the presence of [name] DO, 04/10/2021  ? ? Patient ID: Jennifer Sandoval, female    DOB: 1955/08/28, 66 y.o.   MRN: [id number] ? ?Chief Complaint  ?Patient presents with  ? Sore Throat  ?  X1 week, Pt states having sore throat and sinus concern. Pt states productive cough. Pt states trying muncinex and sudafed, and nasal spray. No pain with swallowing  ? ? ?HPI ?Patient is in today for an office visit.  ? ?Patient complains of a possible sinus infection. She states that her sinus infections are chronic however symptoms worsened as of a week ago. She states that she has had pressure around her frontal and maxillary areas as well as drainage. She has been coughing more frequently and notices that the cough increases during the night. She states that her teeth are also painful. She has been taking Mucinex, Nose spray and Tylenol to help relieve symptoms. She has taken Prednisone, Afrin and Augmentin before. Augmentin causes her stomach issues. She has used Flonase and states that it wasn't very effective for her.  ? ?Past Medical History:  ?Diagnosis Date  ? Acute sinusitis 07/31/2006  ? ALLERGIC RHINITIS 08/15/2006  ? Allergy   ? rhinitis  ? Anemia   ? iron deficeincy  ? ANEMIA-NOS 10/24/2006  ? BASAL CELL CARCINOMA SKIN LOWER LIMB INCL HIP 02/15/2010  ? BCC (basal cell carcinoma of skin) 07/10/2011  ? h/o  ? Depression   ? DEPRESSION 10/24/2006  ? 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  ? Dry eyes, bilateral 01/30/2016  ? Dyspepsia 04/19/2010  ? FRACTURE, NOSE 10/22/2007  ? History of gestational diabetes 01/30/2016  ? Insomnia 01/23/2012  ? Leiomyoma of uterus, unspecified 02/15/2010  ? Lumbar compression fracture (HCC)   ? nonsurgical  ? OSTEOARTHRITIS 10/24/2006  ? PERIMENOPAUSAL STATUS 03/16/2010  ? Post-menopause 03/16/2010  ? Qualifier: Diagnosis of  By: Charlett Blake MD, Erline Levine    ?  Preventative health care 08/31/2012  ? SHOULDER PAIN, BILATERAL 10/10/2007  ? ? ?Past Surgical History:  ?Procedure Laterality Date  ? ABDOMINAL HYSTERECTOMY  2009  ? partial, ovaries left in place, for painful, heavy menstrrual bleeding secondary to anemia and fibroids  ? APPENDECTOMY    ? CESAREAN SECTION    ? X 3  ? CHOLECYSTECTOMY    ? GASTRIC BYPASS    ? NASAL SINUS SURGERY    ? SEPTOPLASTY    ? skin biopsy    ? L hip BCC, facial lesions were benign  ? TUBAL LIGATION    ? ventral herniorrhaphy    ? X 2  ? ? ?Family History  ?Problem Relation Age of Onset  ? Cholelithiasis Mother   ? Other Mother   ?     Urinary incontince/ bladder prolapse/ h/o smoking  ? Cancer Mother   ?     laryngeal carcinoma  ? Allergies Mother   ? Arthritis Mother   ?     s/p knee and shoulder replacement  ? Dementia Mother   ?     lewy body  ? Other Father   ?     Renal failure  ? Hypertension Father   ? Coronary artery disease Father   ?     s/p bypass  ? Aortic aneurysm Father   ? Hyperlipidemia Father   ? Heart disease Father 59  ?  quadruple bypass  ? Spina bifida Brother   ?     with stunt/ self cath  ? Seizures Brother   ?     disorders  ? ADD / ADHD Daughter   ? Cancer Maternal Grandmother   ?     colon/ smoker  ? Cancer Maternal Grandfather   ?     lung  ? Cancer Paternal Grandmother   ?     Breast  ? Heart disease Paternal Grandfather   ? Stroke Paternal Grandfather   ? ADD / ADHD Daughter   ? ? ?Social History  ? ?Socioeconomic History  ? Marital status: Married  ?  Spouse name: Not on file  ? Number of children: Not on file  ? Years of education: Not on file  ? Highest education level: Not on file  ?Occupational History  ? Not on file  ?Tobacco Use  ? Smoking status: Never  ? Smokeless tobacco: Never  ?Substance and Sexual Activity  ? Alcohol use: Yes  ?  Comment: special occasion  ? Drug use: No  ? Sexual activity: Yes  ?  Partners: Male  ?Other Topics Concern  ? Not on file  ?Social History Narrative  ? Not on file   ? ?Social Determinants of Health  ? ?Financial Resource Strain: Not on file  ?Food Insecurity: Not on file  ?Transportation Needs: Not on file  ?Physical Activity: Not on file  ?Stress: Not on file  ?Social Connections: Not on file  ?Intimate Partner Violence: Not on file  ? ? ?Outpatient Medications Prior to Visit  ?Medication Sig Dispense Refill  ? meclizine (ANTIVERT) 12.5 MG tablet Take 1 tablet (12.5 mg total) by mouth 3 (three) times daily as needed for dizziness. 30 tablet 0  ? sertraline (ZOLOFT) 100 MG tablet TAKE 1.5 TABLETS BY MOUTH DAILY 135 tablet 1  ? doxycycline (VIBRA-TABS) 100 MG tablet Take 1 tablet (100 mg total) by mouth 2 (two) times daily. (Patient not taking: Reported on 04/10/2021) 28 tablet 0  ? doxycycline (VIBRA-TABS) 100 MG tablet Take 1 tablet (100 mg total) by mouth 2 (two) times daily. (Patient not taking: Reported on 04/10/2021) 28 tablet 0  ? ?No facility-administered medications prior to visit.  ? ? ?Allergies  ?Allergen Reactions  ? Itraconazole   ?  REACTION: Rash  ? Levaquin [Levofloxacin In D5w]   ?  Joint pain  ? ? ?Review of Systems  ?Constitutional:  Negative for fever and malaise/fatigue.  ?HENT:  Positive for sinus pain and sore throat. Negative for congestion.   ?Eyes:  Negative for blurred vision.  ?Respiratory:  Positive for cough. Negative for shortness of breath.   ?Cardiovascular:  Negative for chest pain, palpitations and leg swelling.  ?Gastrointestinal:  Negative for abdominal pain, blood in stool and nausea.  ?Genitourinary:  Negative for dysuria and frequency.  ?Musculoskeletal:  Negative for falls.  ?Skin:  Negative for rash.  ?Neurological:  Negative for dizziness, loss of consciousness and headaches.  ?Endo/Heme/Allergies:  Negative for environmental allergies.  ?Psychiatric/Behavioral:  Negative for depression. The patient is not nervous/anxious.   ? ?   ?Objective:  ?  ?Physical Exam ?Vitals and nursing note reviewed.  ?Constitutional:   ?   General: She is  not in acute distress. ?   Appearance: Normal appearance. She is well-developed. She is not ill-appearing.  ?HENT:  ?   Head: Normocephalic and atraumatic.  ?   Right Ear: External ear normal.  ?   Left Ear:  External ear normal.  ?   Nose:  ?   Right Sinus: Maxillary sinus tenderness and frontal sinus tenderness present.  ?   Left Sinus: Maxillary sinus tenderness and frontal sinus tenderness present.  ?Eyes:  ?   Extraocular Movements: Extraocular movements intact.  ?   Conjunctiva/sclera: Conjunctivae normal.  ?   Pupils: Pupils are equal, round, and reactive to light.  ?Neck:  ?   Thyroid: No thyromegaly.  ?   Vascular: No carotid bruit or JVD.  ?Cardiovascular:  ?   Rate and Rhythm: Normal rate and regular rhythm.  ?   Heart sounds: Normal heart sounds. No murmur heard. ?  No gallop.  ?Pulmonary:  ?   Effort: Pulmonary effort is normal. No respiratory distress.  ?   Breath sounds: Normal breath sounds. No wheezing or rales.  ?Chest:  ?   Chest wall: No tenderness.  ?Musculoskeletal:  ?   Cervical back: Normal range of motion and neck supple.  ?Skin: ?   General: Skin is warm and dry.  ?Neurological:  ?   Mental Status: She is alert and oriented to person, place, and time.  ?Psychiatric:     ?   Judgment: Judgment normal.  ? ? ?BP 112/80 (BP Location: Left Arm, Patient Position: Sitting, Cuff Size: Normal)   Pulse 84   Temp 98.1 ?F (36.7 ?C) (Oral)   Resp 18   Ht '5\' 2"'$  (1.575 m)   Wt 134 lb 3.2 oz (60.9 kg)   SpO2 98%   BMI 24.55 kg/m?  ?Wt Readings from Last 3 Encounters:  ?04/10/21 134 lb 3.2 oz (60.9 kg)  ?11/23/20 130 lb (59 kg)  ?11/14/18 128 lb (58.1 kg)  ? ? ?Diabetic Foot Exam - Simple   ?No data filed ?  ? ?Lab Results  ?Component Value Date  ? WBC 9.4 11/01/2016  ? HGB 12.5 11/01/2016  ? HCT 39.0 11/01/2016  ? PLT 235 11/01/2016  ? GLUCOSE 94 11/01/2016  ? CHOL 132 01/30/2016  ? TRIG 95.0 01/30/2016  ? HDL 67.70 01/30/2016  ? LDLCALC 45 01/30/2016  ? ALT 37 (H) 01/30/2016  ? AST 34 01/30/2016  ?  NA 139 11/01/2016  ? K 3.9 11/01/2016  ? CL 102 11/01/2016  ? CREATININE 0.81 11/01/2016  ? BUN 12 11/01/2016  ? CO2 29 11/01/2016  ? TSH 1.33 01/30/2016  ? INR 1.1 02/25/2007  ? HGBA1C 6.0 10/10/2011  ? ? ?Lab Resul

## 2021-04-10 NOTE — Assessment & Plan Note (Signed)
abx per orders  ?Pt did not like steroid nasal spray ?con't antihistamine ?pred taper  ?

## 2021-04-10 NOTE — Patient Instructions (Signed)

## 2021-05-17 ENCOUNTER — Other Ambulatory Visit: Payer: Self-pay | Admitting: Family Medicine

## 2021-07-28 ENCOUNTER — Telehealth (INDEPENDENT_AMBULATORY_CARE_PROVIDER_SITE_OTHER): Payer: No Typology Code available for payment source | Admitting: Family

## 2021-07-28 DIAGNOSIS — J019 Acute sinusitis, unspecified: Secondary | ICD-10-CM

## 2021-07-28 MED ORDER — DOXYCYCLINE HYCLATE 100 MG PO TABS: 100.0000 mg | ORAL_TABLET | Freq: Two times a day (BID) | ORAL | 0 refills | Status: DC

## 2021-07-28 NOTE — Progress Notes (Signed)
Jennifer Sandoval is a 66 y.o. female with the following history as recorded in [employer]:  Patient Active Problem List   Diagnosis Date Noted   Pansinusitis 10/14/2020   History of gestational diabetes 01/30/2016   H/O gastric bypass 01/30/2016   Dry eyes, bilateral 01/30/2016   Acute bacterial sinusitis 09/29/2014   High serum phosphorus for age 45/27/2015   Palpitations 06/14/2013   Preventative health care 08/31/2012   Insomnia 01/23/2012   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   Dyspepsia 04/19/2010   Post-menopause 03/16/2010   BASAL CELL CARCINOMA SKIN LOWER LIMB INCL HIP 02/15/2010   LEIOMYOMA OF UTERUS, UNSPECIFIED 02/15/2010   FRACTURE, NOSE 10/22/2007   Neck and shoulder pain 10/10/2007   Anemia 10/24/2006   Depression with anxiety 10/24/2006   OSTEOARTHRITIS 10/24/2006   Allergic rhinitis 08/15/2006    Current Outpatient Medications  Medication Sig Dispense Refill   doxycycline (VIBRA-TABS) 100 MG tablet Take 1 tablet (100 mg total) by mouth 2 (two) times daily. 28 tablet 0   meclizine (ANTIVERT) 12.5 MG tablet Take 1 tablet (12.5 mg total) by mouth 3 (three) times daily as needed for dizziness. 30 tablet 0   sertraline (ZOLOFT) 100 MG tablet TAKE 1 AND 1/2 TABLETS BY MOUTH EVERY DAY 135 tablet 0   No current facility-administered medications for this visit.    Allergies: Itraconazole and Levaquin [levofloxacin in d5w]  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   Dry eyes, bilateral 01/30/2016   Dyspepsia 04/19/2010   FRACTURE, NOSE 10/22/2007   History of gestational diabetes 01/30/2016   Insomnia 01/23/2012   Leiomyoma of uterus,  unspecified 02/15/2010   Lumbar compression fracture ([place])    nonsurgical   OSTEOARTHRITIS 10/24/2006   PERIMENOPAUSAL STATUS 03/16/2010   Post-menopause 03/16/2010   Qualifier: Diagnosis of  By: Charlett Blake MD, [facility]     Preventative health care 08/31/2012   SHOULDER PAIN, BILATERAL 10/10/2007    Past Surgical History:  Procedure Laterality Date   ABDOMINAL HYSTERECTOMY  2009   partial, ovaries left in place, for painful, heavy menstrrual bleeding secondary to anemia and fibroids   APPENDECTOMY     CESAREAN SECTION     X 3   CHOLECYSTECTOMY     GASTRIC BYPASS     NASAL SINUS SURGERY     SEPTOPLASTY     skin biopsy     L hip BCC, facial lesions were benign   TUBAL LIGATION     ventral herniorrhaphy     X 2    Family History  Problem Relation Age of Onset   Cholelithiasis Mother    Other Mother        Urinary incontince/ bladder prolapse/ h/o smoking   Cancer Mother        laryngeal carcinoma   Allergies Mother    Arthritis Mother        s/p knee and shoulder replacement   Dementia Mother        lewy body   Other Father        Renal failure   Hypertension Father    Coronary artery disease Father  s/p bypass   Aortic aneurysm Father    Hyperlipidemia Father    Heart disease Father 44       quadruple bypass   Spina bifida Brother        with stunt/ self cath   Seizures Brother        disorders   ADD / ADHD Daughter    Cancer Maternal Grandmother        colon/ smoker   Cancer Maternal Grandfather        lung   Cancer Paternal Grandmother        Breast   Heart disease Paternal Grandfather    Stroke Paternal Grandfather    ADD / ADHD Daughter     Social History   Tobacco Use   Smoking status: Never   Smokeless tobacco: Never  Substance Use Topics   Alcohol use: Yes    Comment: special occasion    Subjective:    I connected with Jennifer Sandoval on 07/28/21 at  2:40 PM EDT by a video enabled telemedicine application and verified that I am speaking with the  correct person using two identifiers.   I discussed the limitations of evaluation and management by telemedicine and the availability of in person appointments. The patient expressed understanding and agreed to proceed. Provider in office/ patient is at home; provider and patient are only 2 people on video call.   Prone to recurrent sinus infections; notes she may get an infection every 2-3 months; does feel that last infection from April 2023 did clear completely;   Has not been seen in the office since 2020; overdue for CPE;      Objective:  Vitals:    General: Well developed, well nourished, in no acute distress  Skin : Warm and dry.  Head: Normocephalic and atraumatic  Lungs: Respirations unlabored;  Neurologic: Alert and oriented; speech intact; face symmetrical; moves all extremities well; CNII-XII intact without focal deficit   Assessment:  1. Acute sinusitis, recurrence not specified, unspecified location     Plan:  Will treat with Doxycycline x 14 days per patient request;  Discussed that it is very important for her to schedule in office appointment for CPE/ fasting labs; patient agrees and I will have someone from our office reach out to her to schedule.   No follow-ups on file.  No orders of the defined types were placed in this encounter.   Requested Prescriptions   Signed Prescriptions Disp Refills   doxycycline (VIBRA-TABS) 100 MG tablet 28 tablet 0    Sig: Take 1 tablet (100 mg total) by mouth 2 (two) times daily.

## 2021-08-11 ENCOUNTER — Other Ambulatory Visit: Payer: Self-pay | Admitting: Family Medicine

## 2021-10-19 ENCOUNTER — Telehealth: Payer: No Typology Code available for payment source | Admitting: Family Medicine

## 2021-10-20 ENCOUNTER — Encounter: Payer: Self-pay | Admitting: Family Medicine

## 2021-10-20 ENCOUNTER — Telehealth (INDEPENDENT_AMBULATORY_CARE_PROVIDER_SITE_OTHER): Payer: No Typology Code available for payment source | Admitting: Family Medicine

## 2021-10-20 DIAGNOSIS — J324 Chronic pansinusitis: Secondary | ICD-10-CM | POA: Diagnosis not present

## 2021-10-20 DIAGNOSIS — J019 Acute sinusitis, unspecified: Secondary | ICD-10-CM | POA: Diagnosis not present

## 2021-10-20 MED ORDER — DOXYCYCLINE HYCLATE 100 MG PO TABS: 100.0000 mg | ORAL_TABLET | Freq: Two times a day (BID) | ORAL | 0 refills | Status: DC

## 2021-10-20 NOTE — Progress Notes (Signed)
MyChart Video Visit    Virtual Visit via Video Note   This visit type was conducted due to national recommendations for restrictions regarding the COVID-19 Pandemic (e.g. social distancing) in an effort to limit this patient's exposure and mitigate transmission in our community. This patient is at least at moderate risk for complications without adequate follow up. This format is felt to be most appropriate for this patient at this time. Physical exam was limited by quality of the video and audio technology used for the visit. CMA was able to get the patient set up on a video visit.  Patient location: Home Patient and provider in visit Provider location: Office  I discussed the limitations of evaluation and management by telemedicine and the availability of in person appointments. The patient expressed understanding and agreed to proceed.  Visit Date: 10/20/2021.  Today's healthcare provider: Ann Held, DO     Subjective:    Patient ID: Jennifer Sandoval, female    DOB: 10-23-1955, 66 y.o.   MRN: [id number]  Chief Complaint  Patient presents with   Sinus Problem    Sxs started a couple weeks ago. Pt having productive cough, sinuses congestion, no fever, no body aches or chills. Pt reports headache. Pt using OTC Tylenol prn and Mucinex     HPI Patient is in today for a virtual office visit.  Sinus issues- Patient is complaining of feeling congested, nasal drainage, headaches, coughing with occasional sputum and head pressure for a couple of weeks. She denies any fever. She reports that she relieves symptoms with Mucinex and Tylenol. Patient states she constantly has sinus issues and had a sinus infection in July 2023 and was prescribed 100 mg Doxycycline which helped. She has taken Flonase in the past with no relief.  Past Medical History:  Diagnosis Date   Acute sinusitis 07/31/2006   ALLERGIC RHINITIS 08/15/2006   Allergy    rhinitis   Anemia    iron deficeincy    ANEMIA-NOS 10/24/2006   BASAL CELL CARCINOMA SKIN LOWER LIMB INCL HIP 02/15/2010   BCC (basal cell carcinoma of skin) 07/10/2011   h/o   Depression    DEPRESSION 10/24/2006   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   Dry eyes, bilateral 01/30/2016   Dyspepsia 04/19/2010   FRACTURE, NOSE 10/22/2007   History of gestational diabetes 01/30/2016   Insomnia 01/23/2012   Leiomyoma of uterus, unspecified 02/15/2010   Lumbar compression fracture ([place])    nonsurgical   OSTEOARTHRITIS 10/24/2006   PERIMENOPAUSAL STATUS 03/16/2010   Post-menopause 03/16/2010   Qualifier: Diagnosis of  By: Charlett Blake MD, [place]     Preventative health care 08/31/2012   SHOULDER PAIN, BILATERAL 10/10/2007    Past Surgical History:  Procedure Laterality Date   ABDOMINAL HYSTERECTOMY  2009   partial, ovaries left in place, for painful, heavy menstrrual bleeding secondary to anemia and fibroids   APPENDECTOMY     CESAREAN SECTION     X 3   CHOLECYSTECTOMY     GASTRIC BYPASS     NASAL SINUS SURGERY     SEPTOPLASTY     skin biopsy     L hip BCC, facial lesions were benign   TUBAL LIGATION     ventral herniorrhaphy     X 2    Family History  Problem Relation Age of Onset   Cholelithiasis Mother    Other Mother        Urinary incontince/ bladder prolapse/ h/o smoking   Cancer  Mother        laryngeal carcinoma   Allergies Mother    Arthritis Mother        s/p knee and shoulder replacement   Dementia Mother        lewy body   Other Father        Renal failure   Hypertension Father    Coronary artery disease Father        s/p bypass   Aortic aneurysm Father    Hyperlipidemia Father    Heart disease Father 28       quadruple bypass   Spina bifida Brother        with stunt/ self cath   Seizures Brother        disorders   ADD / ADHD Daughter    Cancer Maternal Grandmother        colon/ smoker   Cancer Maternal Grandfather        lung   Cancer Paternal Grandmother        Breast   Heart disease  Paternal Grandfather    Stroke Paternal Grandfather    ADD / ADHD Daughter     Social History   Socioeconomic History   Marital status: Married    Spouse name: Not on file   Number of children: Not on file   Years of education: Not on file   Highest education level: Not on file  Occupational History   Not on file  Tobacco Use   Smoking status: Never   Smokeless tobacco: Never  Substance and Sexual Activity   Alcohol use: Yes    Comment: special occasion   Drug use: No   Sexual activity: Yes    Partners: Male  Other Topics Concern   Not on file  Social History Narrative   Not on file   Social Determinants of Health   Financial Resource Strain: Not on file  Food Insecurity: Not on file  Transportation Needs: Not on file  Physical Activity: Not on file  Stress: Not on file  Social Connections: Not on file  Intimate Partner Violence: Not on file    Outpatient Medications Prior to Visit  Medication Sig Dispense Refill   meclizine (ANTIVERT) 12.5 MG tablet Take 1 tablet (12.5 mg total) by mouth 3 (three) times daily as needed for dizziness. 30 tablet 0   sertraline (ZOLOFT) 100 MG tablet TAKE 1.5 TABLETS BY MOUTH EVERY DAY 135 tablet 0   doxycycline (VIBRA-TABS) 100 MG tablet Take 1 tablet (100 mg total) by mouth 2 (two) times daily. (Patient not taking: Reported on 10/20/2021) 28 tablet 0   No facility-administered medications prior to visit.    Allergies  Allergen Reactions   Itraconazole     REACTION: Rash   Levaquin [Levofloxacin In D5w]     Joint pain    Review of Systems  Constitutional:  Negative for fever and malaise/fatigue.  HENT:  Positive for congestion and sinus pain.        (+) nasal drainage  Eyes:  Negative for blurred vision.  Respiratory:  Positive for cough. Negative for shortness of breath.   Cardiovascular:  Negative for chest pain, palpitations and leg swelling.  Gastrointestinal:  Negative for abdominal pain, blood in stool and nausea.   Genitourinary:  Negative for dysuria and frequency.  Musculoskeletal:  Negative for falls.  Skin:  Negative for rash.  Neurological:  Positive for headaches. Negative for dizziness and loss of consciousness.  Endo/Heme/Allergies:  Negative for environmental allergies.  Psychiatric/Behavioral:  Negative for depression. The patient is not nervous/anxious.        Objective:    Physical Exam Vitals and nursing note reviewed.  Constitutional:      Appearance: She is well-developed.  Pulmonary:     Effort: Pulmonary effort is normal.  Neurological:     Mental Status: She is alert and oriented to person, place, and time.     There were no vitals taken for this visit. Wt Readings from Last 3 Encounters:  04/10/21 134 lb 3.2 oz (60.9 kg)  11/23/20 130 lb (59 kg)  11/14/18 128 lb (58.1 kg)    Diabetic Foot Exam - Simple   No data filed    Lab Results  Component Value Date   WBC 9.4 11/01/2016   HGB 12.5 11/01/2016   HCT 39.0 11/01/2016   PLT 235 11/01/2016   GLUCOSE 94 11/01/2016   CHOL 132 01/30/2016   TRIG 95.0 01/30/2016   HDL 67.70 01/30/2016   LDLCALC 45 01/30/2016   ALT 37 (H) 01/30/2016   AST 34 01/30/2016   NA 139 11/01/2016   K 3.9 11/01/2016   CL 102 11/01/2016   CREATININE 0.81 11/01/2016   BUN 12 11/01/2016   CO2 29 11/01/2016   TSH 1.33 01/30/2016   INR 1.1 02/25/2007   HGBA1C 6.0 10/10/2011    Lab Results  Component Value Date   TSH 1.33 01/30/2016   Lab Results  Component Value Date   WBC 9.4 11/01/2016   HGB 12.5 11/01/2016   HCT 39.0 11/01/2016   MCV 89.7 11/01/2016   PLT 235 11/01/2016   Lab Results  Component Value Date   NA 139 11/01/2016   K 3.9 11/01/2016   CO2 29 11/01/2016   GLUCOSE 94 11/01/2016   BUN 12 11/01/2016   CREATININE 0.81 11/01/2016   BILITOT 0.3 01/30/2016   ALKPHOS 84 01/30/2016   AST 34 01/30/2016   ALT 37 (H) 01/30/2016   PROT 6.5 01/30/2016   ALBUMIN 4.3 01/30/2016   CALCIUM 9.2 11/01/2016   ANIONGAP  8 11/01/2016   GFR 90.93 01/30/2016   Lab Results  Component Value Date   CHOL 132 01/30/2016   Lab Results  Component Value Date   HDL 67.70 01/30/2016   Lab Results  Component Value Date   LDLCALC 45 01/30/2016   Lab Results  Component Value Date   TRIG 95.0 01/30/2016   Lab Results  Component Value Date   CHOLHDL 2 01/30/2016   Lab Results  Component Value Date   HGBA1C 6.0 10/10/2011       Assessment & Plan:   Problem List Items Addressed This Visit       Unprioritized   Pansinusitis    abx per orders  Pt did not want nasal spray and states she does not need anything else I did tell her to schedule a cpe with pcp      Relevant Medications   doxycycline (VIBRA-TABS) 100 MG tablet   Other Visit Diagnoses     Acute sinusitis, recurrence not specified, unspecified location    -  Primary   Relevant Medications   doxycycline (VIBRA-TABS) 100 MG tablet       Meds ordered this encounter  Medications   doxycycline (VIBRA-TABS) 100 MG tablet    Sig: Take 1 tablet (100 mg total) by mouth 2 (two) times daily.    Dispense:  28 tablet    Refill:  0    I discussed the assessment and  treatment plan with the patient. The patient was provided an opportunity to ask questions and all were answered. The patient agreed with the plan and demonstrated an understanding of the instructions.   The patient was advised to call back or seek an in-person evaluation if the symptoms worsen or if the condition fails to improve as anticipated.    I,Shandell Giovanni R Lowne Chase,acting as a scribe for [employer], DO.,have documented all relevant documentation on the behalf of Ann Held, DO,as directed by  Ann Held, DO while in the presence of Ann Held, DO.  Ann Held, DO [place] at [employer] [phone] (phone) [phone] (fax)  [place]

## 2021-10-20 NOTE — Assessment & Plan Note (Signed)
abx per orders  Pt did not want nasal spray and states she does not need anything else I did tell her to schedule a cpe with pcp

## 2021-10-30 DIAGNOSIS — F411 Generalized anxiety disorder: Secondary | ICD-10-CM | POA: Diagnosis not present

## 2021-11-09 ENCOUNTER — Other Ambulatory Visit: Payer: Self-pay | Admitting: Family Medicine

## 2021-11-21 DIAGNOSIS — F411 Generalized anxiety disorder: Secondary | ICD-10-CM | POA: Diagnosis not present

## 2021-12-22 ENCOUNTER — Telehealth: Payer: Self-pay | Admitting: Physician Assistant

## 2021-12-22 DIAGNOSIS — J324 Chronic pansinusitis: Secondary | ICD-10-CM

## 2021-12-22 MED ORDER — AMOXICILLIN-POT CLAVULANATE 875-125 MG PO TABS: 1.0000 | ORAL_TABLET | Freq: Two times a day (BID) | ORAL | 0 refills | Status: DC

## 2021-12-22 NOTE — Progress Notes (Signed)

## 2022-01-04 ENCOUNTER — Emergency Department (HOSPITAL_BASED_OUTPATIENT_CLINIC_OR_DEPARTMENT_OTHER)
Admission: EM | Admit: 2022-01-04 | Discharge: 2022-01-04 | Disposition: A | Discharge status: Home / Self Care | Payer: PPO | Attending: Emergency Medicine | Admitting: Emergency Medicine

## 2022-01-04 ENCOUNTER — Other Ambulatory Visit: Payer: Self-pay

## 2022-01-04 ENCOUNTER — Emergency Department (HOSPITAL_BASED_OUTPATIENT_CLINIC_OR_DEPARTMENT_OTHER): Payer: PPO

## 2022-01-04 ENCOUNTER — Encounter (HOSPITAL_BASED_OUTPATIENT_CLINIC_OR_DEPARTMENT_OTHER): Payer: Self-pay

## 2022-01-04 DIAGNOSIS — I959 Hypotension, unspecified: Secondary | ICD-10-CM | POA: Diagnosis not present

## 2022-01-04 DIAGNOSIS — J101 Influenza due to other identified influenza virus with other respiratory manifestations: Secondary | ICD-10-CM | POA: Insufficient documentation

## 2022-01-04 DIAGNOSIS — R0602 Shortness of breath: Secondary | ICD-10-CM | POA: Diagnosis not present

## 2022-01-04 DIAGNOSIS — J9801 Acute bronchospasm: Secondary | ICD-10-CM | POA: Insufficient documentation

## 2022-01-04 DIAGNOSIS — Z1152 Encounter for screening for COVID-19: Secondary | ICD-10-CM | POA: Diagnosis not present

## 2022-01-04 DIAGNOSIS — E876 Hypokalemia: Secondary | ICD-10-CM | POA: Insufficient documentation

## 2022-01-04 DIAGNOSIS — R059 Cough, unspecified: Secondary | ICD-10-CM | POA: Diagnosis not present

## 2022-01-04 DIAGNOSIS — R000 Tachycardia, unspecified: Secondary | ICD-10-CM | POA: Diagnosis not present

## 2022-01-04 DIAGNOSIS — R509 Fever, unspecified: Secondary | ICD-10-CM | POA: Diagnosis not present

## 2022-01-04 LAB — COMPREHENSIVE METABOLIC PANEL
ALT: 15 U/L (ref 0–44)
AST: 28 U/L (ref 15–41)
Albumin: 3.3 g/dL — ABNORMAL LOW (ref 3.5–5.0)
Alkaline Phosphatase: 71 U/L (ref 38–126)
Anion gap: 10 (ref 5–15)
BUN: 12 mg/dL (ref 8–23)
CO2: 22 mmol/L (ref 22–32)
Calcium: 7.6 mg/dL — ABNORMAL LOW (ref 8.9–10.3)
Chloride: 106 mmol/L (ref 98–111)
Creatinine, Ser: 0.82 mg/dL (ref 0.44–1.00)
GFR, Estimated: >60 mL/min (ref >60)
Glucose, Bld: 152 mg/dL — ABNORMAL HIGH (ref 70–99)
Potassium: 2.6 mmol/L — CL (ref 3.5–5.1)
Sodium: 138 mmol/L (ref 135–145)
Total Bilirubin: 0.5 mg/dL (ref 0.3–1.2)
Total Protein: 5.9 g/dL — ABNORMAL LOW (ref 6.5–8.1)

## 2022-01-04 LAB — CBC WITH DIFFERENTIAL/PLATELET
Abs Immature Granulocytes: 0.03 10*3/uL (ref 0.00–0.07)
Basophils Absolute: 0 10*3/uL (ref 0.0–0.1)
Basophils Relative: 0 %
Eosinophils Absolute: 0 10*3/uL (ref 0.0–0.5)
Eosinophils Relative: 0 %
HCT: 36.7 % (ref 36.0–46.0)
Hemoglobin: 11.7 g/dL — ABNORMAL LOW (ref 12.0–15.0)
Immature Granulocytes: 0 %
Lymphocytes Relative: 12 %
Lymphs Abs: 1 10*3/uL (ref 0.7–4.0)
MCH: 29.5 pg (ref 26.0–34.0)
MCHC: 31.9 g/dL (ref 30.0–36.0)
MCV: 92.7 fL (ref 80.0–100.0)
Monocytes Absolute: 0.2 10*3/uL (ref 0.1–1.0)
Monocytes Relative: 2 %
Neutro Abs: 7.2 10*3/uL (ref 1.7–7.7)
Neutrophils Relative %: 86 %
Platelets: 150 10*3/uL (ref 150–400)
RBC: 3.96 MIL/uL (ref 3.87–5.11)
RDW: 13.3 % (ref 11.5–15.5)
WBC: 8.5 10*3/uL (ref 4.0–10.5)
nRBC: 0 % (ref 0.0–0.2)

## 2022-01-04 LAB — RESP PANEL BY RT-PCR (RSV, FLU A&B, COVID)  RVPGX2
Influenza A by PCR: NEGATIVE
Influenza B by PCR: POSITIVE — AB
Resp Syncytial Virus by PCR: NEGATIVE
SARS Coronavirus 2 by RT PCR: NEGATIVE

## 2022-01-04 LAB — URINALYSIS, ROUTINE W REFLEX MICROSCOPIC
Bilirubin Urine: NEGATIVE
Glucose, UA: NEGATIVE mg/dL
Hgb urine dipstick: NEGATIVE
Ketones, ur: NEGATIVE mg/dL
Leukocytes,Ua: NEGATIVE
Nitrite: NEGATIVE
Protein, ur: NEGATIVE mg/dL
Specific Gravity, Urine: <=1.005 (ref 1.005–1.030)
pH: 5.5 (ref 5.0–8.0)

## 2022-01-04 LAB — PROTIME-INR
INR: 1 (ref 0.8–1.2)
Prothrombin Time: 12.8 seconds (ref 11.4–15.2)

## 2022-01-04 LAB — LACTIC ACID, PLASMA: Lactic Acid, Venous: 1.7 mmol/L (ref 0.5–1.9)

## 2022-01-04 MED ORDER — POTASSIUM CHLORIDE CRYS ER 20 MEQ PO TBCR
40.0000 meq | EXTENDED_RELEASE_TABLET | Freq: Once | ORAL | Status: AC
  Administered 2022-01-04: 40 meq via ORAL
  Filled 2022-01-04: qty 2

## 2022-01-04 MED ORDER — ACETAMINOPHEN 325 MG PO TABS
650.0000 mg | ORAL_TABLET | Freq: Once | ORAL | Status: AC | PRN
  Administered 2022-01-04: 650 mg via ORAL
  Filled 2022-01-04: qty 2

## 2022-01-04 MED ORDER — ALBUTEROL SULFATE HFA 108 (90 BASE) MCG/ACT IN AERS: 2.0000 | INHALATION_SPRAY | RESPIRATORY_TRACT | 0 refills | Status: DC | PRN

## 2022-01-04 MED ORDER — IPRATROPIUM-ALBUTEROL 0.5-2.5 (3) MG/3ML IN SOLN
3.0000 mL | Freq: Once | RESPIRATORY_TRACT | Status: AC
  Administered 2022-01-04: 3 mL via RESPIRATORY_TRACT
  Filled 2022-01-04: qty 3

## 2022-01-04 MED ORDER — PREDNISONE 50 MG PO TABS: 50.0000 mg | ORAL_TABLET | Freq: Every day | ORAL | 0 refills | Status: DC

## 2022-01-04 MED ORDER — ALBUTEROL SULFATE HFA 108 (90 BASE) MCG/ACT IN AERS
2.0000 | INHALATION_SPRAY | RESPIRATORY_TRACT | Status: DC | PRN
  Administered 2022-01-04: 2 via RESPIRATORY_TRACT
  Filled 2022-01-04: qty 6.7

## 2022-01-04 MED ORDER — METHYLPREDNISOLONE SODIUM SUCC 125 MG IJ SOLR: 125.0000 mg | Freq: Once | INTRAMUSCULAR | Status: DC

## 2022-01-04 MED ORDER — IBUPROFEN 400 MG PO TABS
400.0000 mg | ORAL_TABLET | Freq: Once | ORAL | Status: AC
  Administered 2022-01-04: 400 mg via ORAL
  Filled 2022-01-04: qty 1

## 2022-01-04 MED ORDER — POTASSIUM CHLORIDE CRYS ER 20 MEQ PO TBCR: 20.0000 meq | EXTENDED_RELEASE_TABLET | Freq: Two times a day (BID) | ORAL | 0 refills | Status: DC

## 2022-01-04 MED ORDER — POTASSIUM CHLORIDE 10 MEQ/100ML IV SOLN
10.0000 meq | INTRAVENOUS | Status: AC
  Administered 2022-01-04 (×2): 10 meq via INTRAVENOUS
  Filled 2022-01-04: qty 100

## 2022-01-04 MED ORDER — ACETAMINOPHEN 325 MG PO TABS: 650.0000 mg | ORAL_TABLET | Freq: Once | ORAL | Status: DC

## 2022-01-04 NOTE — ED Provider Notes (Signed)
[facility] EMERGENCY DEPARTMENT Provider Note   CSN: [id number] Arrival date & time: 01/04/22  0121     History  Chief Complaint  Patient presents with   Shortness of Breath   Fever    Jennifer Sandoval is a 67 y.o. female.  The history is provided by the patient.  Shortness of Breath Associated symptoms: fever   Fever She has history of depression, gestational diabetes, dyspepsia and complains of cough for the last 10 days.  There has been aching in her chest and back related to the cough.  She has had some subjective fevers but no chills or sweats.  She denies nausea, vomiting, diarrhea.  She has been taking acetaminophen at home with little relief.  She denies any sick contacts.  She is non-smoker.   Home Medications Prior to Admission medications   Medication Sig Start Date End Date Taking? Authorizing Provider  amoxicillin-clavulanate (AUGMENTIN) 875-125 MG tablet Take 1 tablet by mouth 2 (two) times daily. 12/22/21   Mar Daring, PA-C  meclizine (ANTIVERT) 12.5 MG tablet Take 1 tablet (12.5 mg total) by mouth 3 (three) times daily as needed for dizziness. 11/23/20   Copland, Gay Filler, MD  sertraline (ZOLOFT) 100 MG tablet TAKE 1 AND 1/2 TABLETS DAILY BY MOUTH 11/09/21   Mosie Lukes, MD      Allergies    Itraconazole and Levaquin [levofloxacin in d5w]    Review of Systems   Review of Systems  Constitutional:  Positive for fever.  Respiratory:  Positive for shortness of breath.   All other systems reviewed and are negative.   Physical Exam Updated Vital Signs BP 110/60   Pulse (!) 116   Temp (!) 101.1 F (38.4 C) (Oral)   Resp 19   Ht '5\' 2"'$  (1.575 m)   Wt 57.2 kg   SpO2 92%   BMI 23.05 kg/m  Physical Exam Vitals and nursing note reviewed.   67 year old female, resting comfortably and in no acute distress. Vital signs are significant for elevated temperature and heart rate. Oxygen saturation is 92%, which is normal. Head is normocephalic  and atraumatic. PERRLA, EOMI. Oropharynx is clear. Neck is nontender and supple without adenopathy or JVD. Back is nontender and there is no CVA tenderness. Lungs have a slightly prolonged exhalation phase with some scattered coarse wheezes but no rales or rhonchi. Chest is nontender. Heart has regular rate and rhythm without murmur. Abdomen is soft, flat, nontender. Extremities have no cyanosis or edema, full range of motion is present. Skin is warm and dry without rash. Neurologic: Mental status is normal, cranial nerves are intact, moves all extremities equally.  ED Results / Procedures / Treatments   Labs (all labs ordered are listed, but only abnormal results are displayed) Labs Reviewed  RESP PANEL BY RT-PCR (RSV, FLU A&B, COVID)  RVPGX2 - Abnormal; Notable for the following components:      Result Value   Influenza B by PCR POSITIVE (*)    All other components within normal limits  CBC WITH DIFFERENTIAL/PLATELET - Abnormal; Notable for the following components:   Hemoglobin 11.7 (*)    All other components within normal limits  CULTURE, BLOOD (ROUTINE X 2)  CULTURE, BLOOD (ROUTINE X 2)  PROTIME-INR  COMPREHENSIVE METABOLIC PANEL  LACTIC ACID, PLASMA  LACTIC ACID, PLASMA  URINALYSIS, ROUTINE W REFLEX MICROSCOPIC    EKG EKG Interpretation  Date/Time:  Thursday January 04 2022 01:42:10 EST Ventricular Rate:  120 PR Interval:  120 QRS Duration: 96 QT Interval:  341 QTC Calculation: 482 R Axis:   69 Text Interpretation: Sinus tachycardia Probable inferior infarct, age indeterminate Lateral leads are also involved When compared with ECG of 11/01/2016, HEART RATE has increased Confirmed by Delora Fuel ([id number]) on 01/04/2022 3:24:07 AM  Radiology DG Chest Portable 1 View  Result Date: 01/04/2022 CLINICAL DATA:  Shortness of breath with chest pain and fever. EXAM: PORTABLE CHEST 1 VIEW COMPARISON:  January 14, 2018 FINDINGS: The heart size and mediastinal contours are within  normal limits. There is moderate severity calcification of the aortic arch. Mild atelectasis is seen within the bilateral lung bases, right slightly greater than left. There is no evidence of a pleural effusion or pneumothorax. The visualized skeletal structures are unremarkable. IMPRESSION: Mild bibasilar atelectasis, right slightly greater than left. Electronically Signed   By: Virgina Norfolk M.D.   On: 01/04/2022 01:54    Procedures Procedures  Cardiac monitor shows sinus tachycardia, per my interpretation.  Medications Ordered in ED Medications  albuterol (VENTOLIN HFA) 108 (90 Base) MCG/ACT inhaler 2 puff (2 puffs Inhalation Given 01/04/22 0152)  methylPREDNISolone sodium succinate (SOLU-MEDROL) 125 mg/2 mL injection 125 mg (125 mg Intravenous Not Given 01/04/22 0339)  acetaminophen (TYLENOL) tablet 650 mg (650 mg Oral Not Given 01/04/22 0340)  acetaminophen (TYLENOL) tablet 650 mg (650 mg Oral Given 01/04/22 0136)  ipratropium-albuterol (DUONEB) 0.5-2.5 (3) MG/3ML nebulizer solution 3 mL (3 mLs Nebulization Given 01/04/22 0346)  ibuprofen (ADVIL) tablet 400 mg (400 mg Oral Given 01/04/22 0343)  potassium chloride SA (KLOR-CON M) CR tablet 40 mEq (40 mEq Oral Given 01/04/22 0356)  potassium chloride 10 mEq in 100 mL IVPB (0 mEq Intravenous Stopped 01/04/22 0558)    ED Course/ Medical Decision Making/ A&P                           Medical Decision Making Amount and/or Complexity of Data Reviewed Labs: ordered. Radiology: ordered.  Risk OTC drugs. Prescription drug management.   Cough and fever, likely viral illness such as influenza, COVID-19, RSV, other viral respiratory infections, pneumonia.  Because of fever, she was initially placed on the evolving sepsis pathway and she was given albuterol inhaler prior to my seeing her.  Chest x-ray shows atelectasis but no evidence of pneumonia.  I have independently viewed the image, and agree with radiologist's interpretation.  I have reviewed and  interpreted her laboratory tests, and my interpretation is normal WBC but with left shift, mild anemia, normal INR.  Respiratory pathogen panel is positive for influenza B, negative for influenza A, respiratory syncytial virus, COVID-19.  Patient's symptoms have been present for too long for her to be a candidate for antiviral treatment.  I have ordered a nebulizer with albuterol and ipratropium and metabolic panel is significant for severe hypokalemia with potassium 2.6.  I have ordered doses of oral and intravenous potassium.  She also has an elevated random glucose.  Following albuterol with ipratropium, wheezing was gone and she states her lungs felt less tight.  However, she did have brief episode where her oxygen saturations dropped below 90%.  She was put on supplemental oxygen and observed.  Oxygen saturation came back up.  She was able to ambulate in the hall with maintaining oxygen saturation above 95% and is felt to be safe for discharge.  I am discharging her with prescriptions for prednisone and oral potassium.  She is taking home an albuterol inhaler to  use every 4 hours as needed.  Return precautions discussed.  Final Clinical Impression(s) / ED Diagnoses Final diagnoses:  Influenza B  Hypokalemia  Bronchospasm    Rx / DC Orders ED Discharge Orders          Ordered    predniSONE (DELTASONE) 50 MG tablet  Daily        01/04/22 0655    albuterol (VENTOLIN HFA) 108 (90 Base) MCG/ACT inhaler  Every 4 hours PRN        01/04/22 0655    potassium chloride SA (KLOR-CON M) 20 MEQ tablet  2 times daily        01/04/22 7654              Delora Fuel, MD 65/03/54 0700

## 2022-01-04 NOTE — ED Triage Notes (Signed)
Pt seen by EMS at home for sudden onset lower back pain and HA. Pt called again for SOB and LT arm pain. Pt recently dx with sinus infection and finished abx. Pt with rhonchi noted to RT lower lobe. O2 sat 88% with EMS with speaking.

## 2022-01-04 NOTE — ED Notes (Signed)
Ambulated pt around ED and mask on and she was 95% with a HR of 101-105. Pt tolerated well. Let MD Roxanne Mins know.

## 2022-01-04 NOTE — Discharge Instructions (Signed)
Drink plenty of fluids.  Use your inhaler-2 puffs every 4 hours-as needed for cough, wheezing, or difficulty breathing.  Take acetaminophen and/or ibuprofen as needed for fever or aching.  Return if you have any new or concerning symptoms.

## 2022-01-04 NOTE — ED Notes (Signed)
Reported to provider that O2 via [place] @ 2L initiated for pt due to sats ranging from 88-92%

## 2022-01-07 ENCOUNTER — Encounter (HOSPITAL_COMMUNITY): Payer: Self-pay

## 2022-01-07 ENCOUNTER — Encounter (HOSPITAL_BASED_OUTPATIENT_CLINIC_OR_DEPARTMENT_OTHER): Payer: Self-pay | Admitting: Emergency Medicine

## 2022-01-07 ENCOUNTER — Emergency Department (HOSPITAL_BASED_OUTPATIENT_CLINIC_OR_DEPARTMENT_OTHER): Payer: PPO

## 2022-01-07 ENCOUNTER — Other Ambulatory Visit: Payer: Self-pay

## 2022-01-07 ENCOUNTER — Inpatient Hospital Stay (HOSPITAL_BASED_OUTPATIENT_CLINIC_OR_DEPARTMENT_OTHER)
Admission: EM | Admit: 2022-01-07 | Discharge: 2022-01-14 | DRG: 871 | Disposition: A | Discharge status: Home / Self Care | Payer: PPO | Attending: Internal Medicine | Admitting: Internal Medicine

## 2022-01-07 DIAGNOSIS — Z85828 Personal history of other malignant neoplasm of skin: Secondary | ICD-10-CM

## 2022-01-07 DIAGNOSIS — B953 Streptococcus pneumoniae as the cause of diseases classified elsewhere: Secondary | ICD-10-CM | POA: Diagnosis not present

## 2022-01-07 DIAGNOSIS — R7881 Bacteremia: Secondary | ICD-10-CM | POA: Diagnosis present

## 2022-01-07 DIAGNOSIS — R062 Wheezing: Secondary | ICD-10-CM | POA: Diagnosis present

## 2022-01-07 DIAGNOSIS — Z90710 Acquired absence of both cervix and uterus: Secondary | ICD-10-CM | POA: Diagnosis not present

## 2022-01-07 DIAGNOSIS — J13 Pneumonia due to Streptococcus pneumoniae: Secondary | ICD-10-CM | POA: Diagnosis present

## 2022-01-07 DIAGNOSIS — R059 Cough, unspecified: Secondary | ICD-10-CM | POA: Diagnosis not present

## 2022-01-07 DIAGNOSIS — F419 Anxiety disorder, unspecified: Secondary | ICD-10-CM | POA: Diagnosis present

## 2022-01-07 DIAGNOSIS — Z1152 Encounter for screening for COVID-19: Secondary | ICD-10-CM

## 2022-01-07 DIAGNOSIS — J309 Allergic rhinitis, unspecified: Secondary | ICD-10-CM | POA: Diagnosis present

## 2022-01-07 DIAGNOSIS — Z9884 Bariatric surgery status: Secondary | ICD-10-CM

## 2022-01-07 DIAGNOSIS — Z79899 Other long term (current) drug therapy: Secondary | ICD-10-CM

## 2022-01-07 DIAGNOSIS — Z888 Allergy status to other drugs, medicaments and biological substances status: Secondary | ICD-10-CM | POA: Diagnosis not present

## 2022-01-07 DIAGNOSIS — J168 Pneumonia due to other specified infectious organisms: Secondary | ICD-10-CM | POA: Diagnosis not present

## 2022-01-07 DIAGNOSIS — F32A Depression, unspecified: Secondary | ICD-10-CM | POA: Diagnosis present

## 2022-01-07 DIAGNOSIS — J90 Pleural effusion, not elsewhere classified: Secondary | ICD-10-CM | POA: Diagnosis not present

## 2022-01-07 DIAGNOSIS — J189 Pneumonia, unspecified organism: Secondary | ICD-10-CM | POA: Diagnosis present

## 2022-01-07 DIAGNOSIS — R0902 Hypoxemia: Secondary | ICD-10-CM | POA: Diagnosis present

## 2022-01-07 DIAGNOSIS — Z7952 Long term (current) use of systemic steroids: Secondary | ICD-10-CM

## 2022-01-07 DIAGNOSIS — F418 Other specified anxiety disorders: Secondary | ICD-10-CM | POA: Diagnosis present

## 2022-01-07 DIAGNOSIS — E860 Dehydration: Secondary | ICD-10-CM | POA: Diagnosis present

## 2022-01-07 DIAGNOSIS — Z9049 Acquired absence of other specified parts of digestive tract: Secondary | ICD-10-CM | POA: Diagnosis not present

## 2022-01-07 DIAGNOSIS — M1990 Unspecified osteoarthritis, unspecified site: Secondary | ICD-10-CM | POA: Diagnosis present

## 2022-01-07 DIAGNOSIS — J1008 Influenza due to other identified influenza virus with other specified pneumonia: Secondary | ICD-10-CM | POA: Diagnosis present

## 2022-01-07 DIAGNOSIS — I700 Atherosclerosis of aorta: Secondary | ICD-10-CM | POA: Diagnosis not present

## 2022-01-07 DIAGNOSIS — R0602 Shortness of breath: Secondary | ICD-10-CM | POA: Diagnosis not present

## 2022-01-07 DIAGNOSIS — Z881 Allergy status to other antibiotic agents status: Secondary | ICD-10-CM

## 2022-01-07 DIAGNOSIS — J101 Influenza due to other identified influenza virus with other respiratory manifestations: Secondary | ICD-10-CM | POA: Diagnosis not present

## 2022-01-07 DIAGNOSIS — J40 Bronchitis, not specified as acute or chronic: Secondary | ICD-10-CM | POA: Diagnosis present

## 2022-01-07 DIAGNOSIS — R509 Fever, unspecified: Secondary | ICD-10-CM | POA: Diagnosis not present

## 2022-01-07 LAB — CBC WITH DIFFERENTIAL/PLATELET
Abs Immature Granulocytes: 0.15 10*3/uL — ABNORMAL HIGH (ref 0.00–0.07)
Basophils Absolute: 0 10*3/uL (ref 0.0–0.1)
Basophils Relative: 0 %
Eosinophils Absolute: 0.1 10*3/uL (ref 0.0–0.5)
Eosinophils Relative: 1 %
HCT: 37.9 % (ref 36.0–46.0)
Hemoglobin: 12.6 g/dL (ref 12.0–15.0)
Immature Granulocytes: 2 %
Lymphocytes Relative: 5 %
Lymphs Abs: 0.3 10*3/uL — ABNORMAL LOW (ref 0.7–4.0)
MCH: 30 pg (ref 26.0–34.0)
MCHC: 33.2 g/dL (ref 30.0–36.0)
MCV: 90.2 fL (ref 80.0–100.0)
Monocytes Absolute: 0.1 10*3/uL (ref 0.1–1.0)
Monocytes Relative: 2 %
Neutro Abs: 6.5 10*3/uL (ref 1.7–7.7)
Neutrophils Relative %: 90 %
Platelets: 260 10*3/uL (ref 150–400)
RBC: 4.2 MIL/uL (ref 3.87–5.11)
RDW: 14.4 % (ref 11.5–15.5)
WBC: 7.2 10*3/uL (ref 4.0–10.5)
nRBC: 0 % (ref 0.0–0.2)

## 2022-01-07 LAB — URINALYSIS, ROUTINE W REFLEX MICROSCOPIC
Bilirubin Urine: NEGATIVE
Glucose, UA: NEGATIVE mg/dL
Ketones, ur: NEGATIVE mg/dL
Leukocytes,Ua: NEGATIVE
Nitrite: NEGATIVE
Protein, ur: 100 mg/dL — AB
Specific Gravity, Urine: 1.01 (ref 1.005–1.030)
pH: 6 (ref 5.0–8.0)

## 2022-01-07 LAB — COMPREHENSIVE METABOLIC PANEL
ALT: 27 U/L (ref 0–44)
AST: 35 U/L (ref 15–41)
Albumin: 2.6 g/dL — ABNORMAL LOW (ref 3.5–5.0)
Alkaline Phosphatase: 69 U/L (ref 38–126)
Anion gap: 9 (ref 5–15)
BUN: 18 mg/dL (ref 8–23)
CO2: 21 mmol/L — ABNORMAL LOW (ref 22–32)
Calcium: 8.3 mg/dL — ABNORMAL LOW (ref 8.9–10.3)
Chloride: 106 mmol/L (ref 98–111)
Creatinine, Ser: 0.6 mg/dL (ref 0.44–1.00)
GFR, Estimated: >60 mL/min (ref >60)
Glucose, Bld: 84 mg/dL (ref 70–99)
Potassium: 3.9 mmol/L (ref 3.5–5.1)
Sodium: 136 mmol/L (ref 135–145)
Total Bilirubin: 1.1 mg/dL (ref 0.3–1.2)
Total Protein: 6.2 g/dL — ABNORMAL LOW (ref 6.5–8.1)

## 2022-01-07 LAB — URINALYSIS, MICROSCOPIC (REFLEX)

## 2022-01-07 LAB — LACTIC ACID, PLASMA: Lactic Acid, Venous: 1.5 mmol/L (ref 0.5–1.9)

## 2022-01-07 LAB — PROTIME-INR
INR: 1.4 — ABNORMAL HIGH (ref 0.8–1.2)
Prothrombin Time: 17.3 seconds — ABNORMAL HIGH (ref 11.4–15.2)

## 2022-01-07 MED ORDER — SODIUM CHLORIDE 0.9 % IV SOLN: INTRAVENOUS | Status: DC | PRN

## 2022-01-07 MED ORDER — SERTRALINE HCL 50 MG PO TABS
150.0000 mg | ORAL_TABLET | Freq: Every day | ORAL | Status: DC
  Administered 2022-01-07 – 2022-01-13 (×7): 150 mg via ORAL
  Filled 2022-01-07 (×7): qty 1

## 2022-01-07 MED ORDER — SODIUM CHLORIDE 0.9 % IV SOLN
2.0000 g | INTRAVENOUS | Status: DC
  Administered 2022-01-08: 2 g via INTRAVENOUS
  Filled 2022-01-07: qty 20

## 2022-01-07 MED ORDER — SODIUM CHLORIDE 0.9 % IV SOLN
500.0000 mg | INTRAVENOUS | Status: DC
  Administered 2022-01-08 – 2022-01-09 (×2): 500 mg via INTRAVENOUS
  Filled 2022-01-07 (×3): qty 5

## 2022-01-07 MED ORDER — SODIUM CHLORIDE 0.9% FLUSH
3.0000 mL | Freq: Two times a day (BID) | INTRAVENOUS | Status: DC
  Administered 2022-01-07 – 2022-01-14 (×14): 3 mL via INTRAVENOUS

## 2022-01-07 MED ORDER — OSELTAMIVIR PHOSPHATE 30 MG PO CAPS
30.0000 mg | ORAL_CAPSULE | Freq: Two times a day (BID) | ORAL | Status: AC
  Administered 2022-01-07 – 2022-01-12 (×10): 30 mg via ORAL
  Filled 2022-01-07 (×10): qty 1

## 2022-01-07 MED ORDER — GUAIFENESIN ER 600 MG PO TB12
600.0000 mg | ORAL_TABLET | Freq: Two times a day (BID) | ORAL | Status: DC
  Administered 2022-01-07 – 2022-01-14 (×14): 600 mg via ORAL
  Filled 2022-01-07 (×14): qty 1

## 2022-01-07 MED ORDER — ONDANSETRON HCL 4 MG/2ML IJ SOLN: 4.0000 mg | Freq: Four times a day (QID) | INTRAMUSCULAR | Status: DC | PRN

## 2022-01-07 MED ORDER — ALBUTEROL SULFATE (2.5 MG/3ML) 0.083% IN NEBU: 2.5000 mg | INHALATION_SOLUTION | RESPIRATORY_TRACT | Status: DC | PRN

## 2022-01-07 MED ORDER — ONDANSETRON HCL 4 MG PO TABS: 4.0000 mg | ORAL_TABLET | Freq: Four times a day (QID) | ORAL | Status: DC | PRN

## 2022-01-07 MED ORDER — HYDROCODONE BIT-HOMATROP MBR 5-1.5 MG/5ML PO SOLN
5.0000 mL | Freq: Four times a day (QID) | ORAL | Status: DC | PRN
  Administered 2022-01-07 – 2022-01-13 (×16): 5 mL via ORAL
  Filled 2022-01-07 (×17): qty 5

## 2022-01-07 MED ORDER — ENOXAPARIN SODIUM 40 MG/0.4ML IJ SOSY
40.0000 mg | PREFILLED_SYRINGE | Freq: Every day | INTRAMUSCULAR | Status: DC
  Administered 2022-01-07 – 2022-01-13 (×7): 40 mg via SUBCUTANEOUS
  Filled 2022-01-07 (×7): qty 0.4

## 2022-01-07 MED ORDER — SODIUM CHLORIDE 0.9 % IV SOLN
500.0000 mg | Freq: Once | INTRAVENOUS | Status: AC
  Administered 2022-01-07: 500 mg via INTRAVENOUS
  Filled 2022-01-07: qty 5

## 2022-01-07 MED ORDER — ACETAMINOPHEN 650 MG RE SUPP: 650.0000 mg | Freq: Four times a day (QID) | RECTAL | Status: DC | PRN

## 2022-01-07 MED ORDER — SENNOSIDES-DOCUSATE SODIUM 8.6-50 MG PO TABS: 1.0000 | ORAL_TABLET | Freq: Every evening | ORAL | Status: DC | PRN

## 2022-01-07 MED ORDER — SODIUM CHLORIDE 0.9 % IV SOLN
1.0000 g | Freq: Once | INTRAVENOUS | Status: AC
  Administered 2022-01-07: 1 g via INTRAVENOUS
  Filled 2022-01-07: qty 10

## 2022-01-07 MED ORDER — IBUPROFEN 800 MG PO TABS
800.0000 mg | ORAL_TABLET | Freq: Once | ORAL | Status: AC
  Administered 2022-01-07: 800 mg via ORAL
  Filled 2022-01-07: qty 1

## 2022-01-07 MED ORDER — BENZONATATE 100 MG PO CAPS
100.0000 mg | ORAL_CAPSULE | Freq: Once | ORAL | Status: AC
  Administered 2022-01-07: 100 mg via ORAL
  Filled 2022-01-07: qty 1

## 2022-01-07 MED ORDER — ACETAMINOPHEN 325 MG PO TABS
650.0000 mg | ORAL_TABLET | Freq: Four times a day (QID) | ORAL | Status: DC | PRN
  Administered 2022-01-07 – 2022-01-13 (×11): 650 mg via ORAL
  Filled 2022-01-07 (×11): qty 2

## 2022-01-07 MED ORDER — LACTATED RINGERS IV BOLUS (SEPSIS)
1000.0000 mL | Freq: Once | INTRAVENOUS | Status: AC
  Administered 2022-01-07: 1000 mL via INTRAVENOUS

## 2022-01-07 MED ORDER — LACTATED RINGERS IV SOLN: INTRAVENOUS | Status: AC

## 2022-01-07 NOTE — ED Notes (Signed)
RT assessed patient per RN upon arrival to room. Patient stated she was having a hard time taking a deep breath. BBS clear, but SAT 91%. Placed patient on 2 LNC.

## 2022-01-07 NOTE — Progress Notes (Signed)
Plan of Care Note for accepted transfer   Patient: Jennifer Sandoval MRN: [id number]   DOA: 01/07/2022  Facility requesting transfer: [facility] Requesting Provider: Dr. Armandina Gemma Reason for transfer: multifocal PNA Facility course: 67 yo F with PMHx of BPPV. Presenting with SOB, tachypnea, hypoxia. Flu B diagnosis on 01/04/22. W/u revealed CXR shows multifocal PNA. She was started on rocephin, azithro.   Plan of care: The patient is accepted for admission to Telemetry unit, at [facility].  While holding at [facility], medical decision making responsibilities remain with the [facility]. Upon arrival to [facility], [facility] will assume care. Thank you.   Author: Jonnie Finner, DO 01/07/2022  Check [email or web address] for on-call coverage.  Nursing staff, Please call [place] number on Amion as soon as patient's arrival, so appropriate admitting provider can evaluate the pt.

## 2022-01-07 NOTE — ED Notes (Signed)
Client states she has been sick for the past few weeks, feeling weak, tired, "dehydrated", poor PO intake, did state she has had some N/V/D. None demonstrated at this time. A&O x4, MAE x 4, color WNL, CMS WNL, capillary refill WNL. Tachycardia noted on monitor and is ill appearing, husband at side. Septic Order work up initiated. Was tachypneic upon arrival and room air POX at 90-91%, placed on 2lpm via [place], POX increased to 97%, client states she can breathe easier now

## 2022-01-07 NOTE — ED Notes (Signed)
Attempted to call report to [phone], no answer

## 2022-01-07 NOTE — Progress Notes (Signed)
Patient oriented to room and unit, tele applied, in bed resting. MD currently in the room assessing patient.  Reported off to night shift nurse to F/U with plan of care

## 2022-01-07 NOTE — ED Notes (Signed)
57.9kg actual wt

## 2022-01-07 NOTE — H&P (Signed)
History and Physical    Jennifer Sandoval UJW:[id number] DOB: [birth year] DOA: 01/07/2022  PCP: Mosie Lukes, MD  Patient coming from: Home  I have personally briefly reviewed patient's old medical records in [place]  Chief Complaint: Shortness of breath  HPI: Jennifer Sandoval is a 67 y.o. female with medical history significant for depression who presented to the ED for evaluation of shortness of breath.  Patient states that she gets recurrent sinus infections which she felt coming on shortly after Christmas.  She was given a course of Augmentin which she has completed.  Several days ago she began to have subjective fevers, chills, diaphoresis, dyspnea with frequent nonproductive cough, body aches, fatigue.  Patient was seen in the ED on 01/04/2022 for her shortness of breath.  She tested positive for influenza B.  Portable CXR showed mild bibasilar atelectasis, right slightly greater than left.  She was treated with steroids and nebulizers with improvement in symptoms.  She was discharged home with prescription for prednisone and albuterol inhaler.  She has had improvement in her sinusitis symptoms however has persistent dyspnea with pleuritic discomfort on deep inspiration.  Has been having frequent cough with chest congestion.  Has been feeling fatigued and dehydrated.  [facility] [place] [facility] ED Course  Labs/Imaging on admission: I have personally reviewed following labs and imaging studies.  Initial vitals showed BP 124/79, pulse 111, RR 18, temp 100.7 F, SpO2 91% on room air.  While in the ED patient tachypneic with RR up to 34.  She was placed on 2 L O2 via [place].  Labs showed WBC 7.2, hemoglobin 12.6, platelets 260,000, sodium 136, potassium 3.9, bicarb 21, BUN 18, creatinine 0.60, serum glucose 84, LFTs within normal limits, lactic acid 1.5.  Urinalysis negative for UTI.  Blood cultures in process.  Portable chest x-ray showed bilateral airspace disease densest at the right base and in  the left midlung.  Patient was given 1 L LR, IV ceftriaxone and azithromycin.  The hospitalist service was consulted to admit for further evaluation and management.  Review of Systems: All systems reviewed and are negative except as documented in history of present illness above.   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   Dry eyes, bilateral 01/30/2016   Dyspepsia 04/19/2010   FRACTURE, NOSE 10/22/2007   History of gestational diabetes 01/30/2016   Insomnia 01/23/2012   Leiomyoma of uterus, unspecified 02/15/2010   Lumbar compression fracture ([place])    nonsurgical   OSTEOARTHRITIS 10/24/2006   PERIMENOPAUSAL STATUS 03/16/2010   Post-menopause 03/16/2010   Qualifier: Diagnosis of  By: Charlett Blake MD, [facility]     Preventative health care 08/31/2012   SHOULDER PAIN, BILATERAL 10/10/2007    Past Surgical History:  Procedure Laterality Date   ABDOMINAL HYSTERECTOMY  01/02/2007   partial, ovaries left in place, for painful, heavy menstrrual bleeding secondary to anemia and fibroids   APPENDECTOMY     CESAREAN SECTION     X 3   CHOLECYSTECTOMY     GASTRIC BYPASS     KNEE SURGERY Right    2018 or 2019   NASAL SINUS  SURGERY     SEPTOPLASTY     skin biopsy     L hip BCC, facial lesions were benign   TUBAL LIGATION     ventral herniorrhaphy     X 2    Social History:  reports that she has never smoked. She has never used smokeless tobacco. She reports current alcohol use. She reports that she does not use drugs.  Allergies  Allergen Reactions   Itraconazole     REACTION: Rash   Levaquin [Levofloxacin In D5w]     Joint pain    Family History  Problem  Relation Age of Onset   Cholelithiasis Mother    Other Mother        Urinary incontince/ bladder prolapse/ h/o smoking   Cancer Mother        laryngeal carcinoma   Allergies Mother    Arthritis Mother        s/p knee and shoulder replacement   Dementia Mother        lewy body   Other Father        Renal failure   Hypertension Father    Coronary artery disease Father        s/p bypass   Aortic aneurysm Father    Hyperlipidemia Father    Heart disease Father 47       quadruple bypass   Spina bifida Brother        with stunt/ self cath   Seizures Brother        disorders   ADD / ADHD Daughter    Cancer Maternal Grandmother        colon/ smoker   Cancer Maternal Grandfather        lung   Cancer Paternal Grandmother        Breast   Heart disease Paternal Grandfather    Stroke Paternal Grandfather    ADD / ADHD Daughter      Prior to Admission medications   Medication Sig Start Date End Date Taking? Authorizing Provider  albuterol (VENTOLIN HFA) 108 (90 Base) MCG/ACT inhaler Inhale 2 puffs into the lungs every 4 (four) hours as needed for wheezing or shortness of breath (or coughing). 06/04/82   Delora Fuel, MD  meclizine (ANTIVERT) 12.5 MG tablet Take 1 tablet (12.5 mg total) by mouth 3 (three) times daily as needed for dizziness. 11/23/20   Copland, Gay Filler, MD  potassium chloride SA (KLOR-CON M) 20 MEQ tablet Take 1 tablet (20 mEq total) by mouth 2 (two) times daily. 06/06/57   Delora Fuel, MD  predniSONE (DELTASONE) 50 MG tablet Take 1 tablet (50 mg total) by mouth daily. 09/04/55   Delora Fuel, MD  sertraline (ZOLOFT) 100 MG tablet TAKE 1 AND 1/2 TABLETS DAILY BY MOUTH 11/09/21   Mosie Lukes, MD    Physical Exam: Vitals:   01/07/22 1455 01/07/22 1630 01/07/22 1635 01/07/22 1745  BP:  136/85  139/87  Pulse:  95  94  Resp:  (!) 33  20  Temp: 98.2 F (36.8 C)  98.5 F (36.9 C) 98 F (36.7 C)  TempSrc: Oral  Oral Oral  SpO2:  95%  98%  Weight:      Height:        Constitutional: Resting in bed with head elevated.  NAD, calm, comfortable Eyes: EOMI, lids and conjunctivae normal ENMT: Mucous membranes are dry. Posterior pharynx clear of any exudate or lesions.Normal dentition.  Neck: normal, supple, no masses. Respiratory: Bibasilar inspiratory crackles.  Normal respiratory effort while on 2 L O2 via [place]. No accessory muscle use.  Cardiovascular: Regular rate and rhythm, no murmurs / rubs / gallops. No extremity edema. Abdomen: no tenderness, no masses palpated. Musculoskeletal: no clubbing / cyanosis. No joint deformity upper and lower extremities. Good ROM, no contractures. Normal muscle tone.  Skin: no rashes, lesions, ulcers. No induration Neurologic: Sensation intact. Strength 5/5 in all 4.  Psychiatric: Normal judgment and insight. Alert and oriented x 3. Normal mood.   EKG: Personally reviewed. Sinus tachycardia, rate 103, no acute ischemic changes.  Rate is slower when compared to prior.  Assessment/Plan Principal Problem:   Multifocal pneumonia Active Problems:   Depression with anxiety   Influenza B   Tinika Bucknam is a 67 y.o. female with medical history significant for depression who is admitted with multifocal pneumonia in setting of influenza B infection.  Assessment and Plan:  Influenza B with multifocal pneumonia: Diagnosed with influenza B on 1/4.  Presenting with persistent dyspnea with repeat x-ray showing new airspace disease most notable at the right base and in the left midlung concerning for superimposed bacterial pneumonia.  SpO2 91% on room air, placed on 2 L O2 via [place]. -Continue IV ceftriaxone and azithromycin -Start Tamiflu -Follow blood cultures, strep pneumonia urinary antigen, sputum culture -Supplemental O2 as needed -Incentive spirometer, flutter valve, albuterol prn -Continue IV fluid hydration overnight  Depression/anxiety: Continue sertraline.  DVT prophylaxis: enoxaparin (LOVENOX) injection 40 mg Start:  01/07/22 2200 Code Status: Full code, confirmed with patient on admission Family Communication: Husband at bedside Disposition Plan: From home and likely discharge to home pending clinical progress Consults called: None Severity of Illness: The appropriate patient status for this patient is OBSERVATION. Observation status is judged to be reasonable and necessary in order to provide the required intensity of service to ensure the patient's safety. The patient's presenting symptoms, physical exam findings, and initial radiographic and laboratory data in the context of their medical condition is felt to place them at decreased risk for further clinical deterioration. Furthermore, it is anticipated that the patient will be medically stable for discharge from the hospital within 2 midnights of admission.   Zada Finders MD [facility]  If 7PM-7AM, please contact night-coverage [email or web address]  01/07/2022, 7:19 PM

## 2022-01-07 NOTE — ED Notes (Signed)
Pt assisted to BSC

## 2022-01-07 NOTE — ED Triage Notes (Signed)
Pt arrives pov, to triage in wheelchair c/o continued dry cough and shob, fever. Recent dx of flu, rpts concern for dehydration

## 2022-01-07 NOTE — ED Provider Notes (Signed)
[facility] EMERGENCY DEPARTMENT Provider Note   CSN: [id number] Arrival date & time: 01/07/22  0915     History  Chief Complaint  Patient presents with   Shortness of Breath    Jennifer Sandoval is a 67 y.o. female presents to the ED complaining of shortness of breath, dry cough, myalgias, and fever.  Patient recently diagnosed with influenza B on 01/04/22 and states she has been very sick over the past week.  She also reports concern for dehydration due to poor appetite and chest pain when she coughs.  Denies nausea, vomiting, diarrhea, abdominal pain.         Home Medications Prior to Admission medications   Medication Sig Start Date End Date Taking? Authorizing Provider  albuterol (VENTOLIN HFA) 108 (90 Base) MCG/ACT inhaler Inhale 2 puffs into the lungs every 4 (four) hours as needed for wheezing or shortness of breath (or coughing). 0/7/37   Delora Fuel, MD  meclizine (ANTIVERT) 12.5 MG tablet Take 1 tablet (12.5 mg total) by mouth 3 (three) times daily as needed for dizziness. 11/23/20   Copland, Gay Filler, MD  potassium chloride SA (KLOR-CON M) 20 MEQ tablet Take 1 tablet (20 mEq total) by mouth 2 (two) times daily. 1/0/62   Delora Fuel, MD  predniSONE (DELTASONE) 50 MG tablet Take 1 tablet (50 mg total) by mouth daily. 06/10/46   Delora Fuel, MD  sertraline (ZOLOFT) 100 MG tablet TAKE 1 AND 1/2 TABLETS DAILY BY MOUTH 11/09/21   Mosie Lukes, MD      Allergies    Itraconazole and Levaquin [levofloxacin in d5w]    Review of Systems   Review of Systems  Constitutional:  Positive for appetite change, chills, fatigue and fever.  Respiratory:  Positive for cough and shortness of breath.   Cardiovascular:  Positive for chest pain (chest pain with coughing).  Gastrointestinal:  Negative for abdominal pain, diarrhea, nausea and vomiting.  Musculoskeletal:  Positive for myalgias.  Neurological:  Positive for weakness (generalized weakness).    Physical Exam Updated  Vital Signs BP 129/88   Pulse 96   Temp (!) 100.7 F (38.2 C) (Oral)   Resp 19   Ht '5\' 2"'$  (1.575 m)   Wt 57.9 kg   SpO2 95%   BMI 23.35 kg/m  Physical Exam Vitals and nursing note reviewed.  Constitutional:      General: She is not in acute distress.    Appearance: She is ill-appearing. She is not toxic-appearing or diaphoretic.  HENT:     Mouth/Throat:     Mouth: Mucous membranes are moist.     Pharynx: Oropharynx is clear.  Cardiovascular:     Rate and Rhythm: Regular rhythm. Tachycardia present.     Pulses: Normal pulses.     Heart sounds: Normal heart sounds. No murmur heard. Pulmonary:     Effort: Pulmonary effort is normal. No accessory muscle usage or respiratory distress.     Breath sounds: Normal air entry. Decreased breath sounds and rales present.     Comments: Rales and decreased breath sounds appreciated bilaterally.  Dry cough also appreciated during exam.   Abdominal:     General: Abdomen is flat. Bowel sounds are normal. There is no distension.     Palpations: Abdomen is soft.     Tenderness: There is no abdominal tenderness.  Skin:    General: Skin is warm and dry.     Capillary Refill: Capillary refill takes less than 2 seconds.  Coloration: Skin is pale. Skin is not cyanotic.  Neurological:     Mental Status: She is alert. Mental status is at baseline.  Psychiatric:        Mood and Affect: Mood normal.        Behavior: Behavior normal.     ED Results / Procedures / Treatments   Labs (all labs ordered are listed, but only abnormal results are displayed) Labs Reviewed  COMPREHENSIVE METABOLIC PANEL - Abnormal; Notable for the following components:      Result Value   CO2 21 (*)    Calcium 8.3 (*)    Total Protein 6.2 (*)    Albumin 2.6 (*)    All other components within normal limits  CBC WITH DIFFERENTIAL/PLATELET - Abnormal; Notable for the following components:   Lymphs Abs 0.3 (*)    Abs Immature Granulocytes 0.15 (*)    All other  components within normal limits  PROTIME-INR - Abnormal; Notable for the following components:   Prothrombin Time 17.3 (*)    INR 1.4 (*)    All other components within normal limits  CULTURE, BLOOD (ROUTINE X 2)  CULTURE, BLOOD (ROUTINE X 2)  LACTIC ACID, PLASMA  URINALYSIS, ROUTINE W REFLEX MICROSCOPIC    EKG EKG Interpretation  Date/Time:  Sunday January 07 2022 10:34:15 EST Ventricular Rate:  103 PR Interval:  122 QRS Duration: 88 QT Interval:  322 QTC Calculation: 422 R Axis:   63 Text Interpretation: Sinus tachycardia Confirmed by Regan Lemming ([id number]) on 01/07/2022 12:53:11 PM  Radiology DG Chest Port 1 View  Result Date: 01/07/2022 CLINICAL DATA:  Dry cough with fever and shortness of breath. Recent diagnosis of flu EXAM: PORTABLE CHEST 1 VIEW COMPARISON:  01/04/2022 FINDINGS: Bilateral airspace disease densest at the right base and in the left mid lung. No cavitation or effusion is seen. No pulmonary edema. Stable heart size and mediastinal contours. IMPRESSION: Multifocal/bilateral pneumonia. Electronically Signed   By: Jorje Guild M.D.   On: 01/07/2022 10:23    Procedures Procedures    Medications Ordered in ED Medications  azithromycin (ZITHROMAX) 500 mg in sodium chloride 0.9 % 250 mL IVPB (500 mg Intravenous New Bag/Given 01/07/22 1308)  0.9 %  sodium chloride infusion ( Intravenous New Bag/Given 01/07/22 1158)  ibuprofen (ADVIL) tablet 800 mg (800 mg Oral Given 01/07/22 1051)  lactated ringers bolus 1,000 mL (0 mLs Intravenous Stopped 01/07/22 1153)  cefTRIAXone (ROCEPHIN) 1 g in sodium chloride 0.9 % 100 mL IVPB (0 g Intravenous Stopped 01/07/22 1229)    ED Course/ Medical Decision Making/ A&P                           Medical Decision Making Amount and/or Complexity of Data Reviewed Labs: ordered. Radiology: ordered. ECG/medicine tests: ordered.  Risk Prescription drug management.   This patient presents to the ED with chief complaint(s) of fever, cough,  shortness of breath with pertinent past medical history of influenza B diagnosis.  The complaint involves an extensive differential diagnosis and also carries with it a high risk of complications and morbidity.    The differential diagnosis includes pneumonia, sepsis, respiratory complications secondary to influenza, acute bronchitis   The initial plan is to obtain sepsis work up, chest x-ray and ekg Additional history obtained: Additional history obtained from spouse  Initial Assessment:   Exam significant for an ill-appearing patient, who is non-toxic and non diaphoretic. Patient placed on nasal cannula due to being  91% SPO2 on room air.  Lung sounds are diminished with fine rales bilaterally, she is tachypneic.  Dry cough appreciated on exam.  Heart rate is tachycardic around 110 with regular rhythm.  Abdomen is soft and non tender.  Skin is pale, warm and dry, no cyanosis.  Mouth, tongue, and lips are dry.    Independent ECG/labs interpretation:  The following labs were independently interpreted:  CBC reveals no evidence of leukocytosis or anemia. Metabolic panel demonstrates no major electrolyte disturbances except for mild hypocalcemia.  There is also mild hypoalbuminemia.  LFTs are within normal range.  Kidney function is also within normal. PT/INR are both elevated at 17.3 and 1.4 respectively. Initial lactic acid is 1.5. ECG demonstrates sinus tachycardia without evidence of ischemia or infarction.  Independent visualization and interpretation of imaging: I independently visualized the following imaging with scope of interpretation limited to determining acute life threatening conditions related to emergency care: Chest x-ray, which revealed opacities bilateral consistent with bilateral or multifocal pneumonia.  No evidence of pneumothorax or pleural effusions.  Heart rate is normal size.  I agree with radiologist interpretation.  Treatment and Reassessment: Will treat patient's fever  with ibuprofen and give LR fluid boluses as part of sepsis workup.  Patient met SIRS criteria with temp of 100.7 F and heart rate of 110 initially.  She does not have leukocytosis, but does have evidence of multifocal pneumonia secondary to influenza.  Patient is requiring oxygen support by nasal cannula at this time.  Broad-spectrum antibiotics to cover pneumonia were started in ED.  Patient will likely need hospital admission for further management of pneumonia.  Consultations Obtained:   I requested consultation with on-call hospitalist provider and spoke with, Cherylann Ratel, discussed patient HPI, physical exam findings, and ED workup.  On-call provider recommended hospital admission at [facility].   Disposition:   I feel that patient would benefit from hospital admission for management and treatment of multifocal/bilateral pneumonia secondary to influenza.  She is requiring oxygen support and has remained in the low to mid 90s for SpO2 despite being on nasal cannula.  Initially was tachypneic in the 20s, but has improved.          Final Clinical Impression(s) / ED Diagnoses Final diagnoses:  Pneumonia of both lower lobes due to infectious organism    Rx / DC Orders ED Discharge Orders     None         Pat Kocher, [place] 01/07/22 1338    Regan Lemming, MD 01/07/22 2137

## 2022-01-08 ENCOUNTER — Telehealth: Payer: Self-pay

## 2022-01-08 DIAGNOSIS — J309 Allergic rhinitis, unspecified: Secondary | ICD-10-CM | POA: Diagnosis present

## 2022-01-08 DIAGNOSIS — J1008 Influenza due to other identified influenza virus with other specified pneumonia: Secondary | ICD-10-CM | POA: Diagnosis present

## 2022-01-08 DIAGNOSIS — J189 Pneumonia, unspecified organism: Secondary | ICD-10-CM | POA: Diagnosis present

## 2022-01-08 DIAGNOSIS — Z7952 Long term (current) use of systemic steroids: Secondary | ICD-10-CM | POA: Diagnosis not present

## 2022-01-08 DIAGNOSIS — M1990 Unspecified osteoarthritis, unspecified site: Secondary | ICD-10-CM | POA: Diagnosis present

## 2022-01-08 DIAGNOSIS — R7881 Bacteremia: Secondary | ICD-10-CM | POA: Diagnosis present

## 2022-01-08 DIAGNOSIS — J40 Bronchitis, not specified as acute or chronic: Secondary | ICD-10-CM | POA: Diagnosis present

## 2022-01-08 DIAGNOSIS — E860 Dehydration: Secondary | ICD-10-CM | POA: Diagnosis present

## 2022-01-08 DIAGNOSIS — R062 Wheezing: Secondary | ICD-10-CM | POA: Diagnosis present

## 2022-01-08 DIAGNOSIS — Z881 Allergy status to other antibiotic agents status: Secondary | ICD-10-CM | POA: Diagnosis not present

## 2022-01-08 DIAGNOSIS — Z85828 Personal history of other malignant neoplasm of skin: Secondary | ICD-10-CM | POA: Diagnosis not present

## 2022-01-08 DIAGNOSIS — F418 Other specified anxiety disorders: Secondary | ICD-10-CM | POA: Diagnosis not present

## 2022-01-08 DIAGNOSIS — J13 Pneumonia due to Streptococcus pneumoniae: Secondary | ICD-10-CM | POA: Diagnosis present

## 2022-01-08 DIAGNOSIS — Z1152 Encounter for screening for COVID-19: Secondary | ICD-10-CM | POA: Diagnosis not present

## 2022-01-08 DIAGNOSIS — Z9049 Acquired absence of other specified parts of digestive tract: Secondary | ICD-10-CM | POA: Diagnosis not present

## 2022-01-08 DIAGNOSIS — F32A Depression, unspecified: Secondary | ICD-10-CM | POA: Diagnosis present

## 2022-01-08 DIAGNOSIS — Z79899 Other long term (current) drug therapy: Secondary | ICD-10-CM | POA: Diagnosis not present

## 2022-01-08 DIAGNOSIS — R0902 Hypoxemia: Secondary | ICD-10-CM | POA: Diagnosis present

## 2022-01-08 DIAGNOSIS — F419 Anxiety disorder, unspecified: Secondary | ICD-10-CM | POA: Diagnosis present

## 2022-01-08 DIAGNOSIS — Z90710 Acquired absence of both cervix and uterus: Secondary | ICD-10-CM | POA: Diagnosis not present

## 2022-01-08 DIAGNOSIS — Z888 Allergy status to other drugs, medicaments and biological substances status: Secondary | ICD-10-CM | POA: Diagnosis not present

## 2022-01-08 DIAGNOSIS — Z9884 Bariatric surgery status: Secondary | ICD-10-CM | POA: Diagnosis not present

## 2022-01-08 LAB — BASIC METABOLIC PANEL
Anion gap: 6 (ref 5–15)
BUN: 23 mg/dL (ref 8–23)
CO2: 25 mmol/L (ref 22–32)
Calcium: 8.2 mg/dL — ABNORMAL LOW (ref 8.9–10.3)
Chloride: 107 mmol/L (ref 98–111)
Creatinine, Ser: 0.54 mg/dL (ref 0.44–1.00)
GFR, Estimated: >60 mL/min (ref >60)
Glucose, Bld: 90 mg/dL (ref 70–99)
Potassium: 4.1 mmol/L (ref 3.5–5.1)
Sodium: 138 mmol/L (ref 135–145)

## 2022-01-08 LAB — BLOOD CULTURE ID PANEL (REFLEXED) - BCID2

## 2022-01-08 LAB — HIV ANTIBODY (ROUTINE TESTING W REFLEX): HIV Screen 4th Generation wRfx: NONREACTIVE

## 2022-01-08 LAB — CBC
HCT: 34.2 % — ABNORMAL LOW (ref 36.0–46.0)
Hemoglobin: 11 g/dL — ABNORMAL LOW (ref 12.0–15.0)
MCH: 29.6 pg (ref 26.0–34.0)
MCHC: 32.2 g/dL (ref 30.0–36.0)
MCV: 92.2 fL (ref 80.0–100.0)
Platelets: 263 10*3/uL (ref 150–400)
RBC: 3.71 MIL/uL — ABNORMAL LOW (ref 3.87–5.11)
RDW: 14.4 % (ref 11.5–15.5)
WBC: 8.6 10*3/uL (ref 4.0–10.5)
nRBC: 0 % (ref 0.0–0.2)

## 2022-01-08 LAB — PROCALCITONIN: Procalcitonin: 7.24 ng/mL

## 2022-01-08 LAB — STREP PNEUMONIAE URINARY ANTIGEN: Strep Pneumo Urinary Antigen: POSITIVE — AB

## 2022-01-08 MED ORDER — MENTHOL 3 MG MT LOZG
1.0000 | LOZENGE | OROMUCOSAL | Status: DC | PRN
  Administered 2022-01-08 – 2022-01-10 (×2): 3 mg via ORAL
  Filled 2022-01-08 (×2): qty 9

## 2022-01-08 MED ORDER — BENZONATATE 100 MG PO CAPS
100.0000 mg | ORAL_CAPSULE | Freq: Three times a day (TID) | ORAL | Status: DC | PRN
  Administered 2022-01-08: 100 mg via ORAL
  Filled 2022-01-08: qty 1

## 2022-01-08 MED ORDER — PHENOL 1.4 % MT LIQD
1.0000 | OROMUCOSAL | Status: DC | PRN
  Administered 2022-01-08 (×2): 1 via OROMUCOSAL
  Filled 2022-01-08: qty 177

## 2022-01-08 MED ORDER — GUAIFENESIN-DM 100-10 MG/5ML PO SYRP: 10.0000 mL | ORAL_SOLUTION | Freq: Four times a day (QID) | ORAL | Status: DC | PRN

## 2022-01-08 MED ORDER — ORAL CARE MOUTH RINSE: 15.0000 mL | OROMUCOSAL | Status: DC | PRN

## 2022-01-08 NOTE — Telephone Encounter (Signed)
Initial Comment Caller states she was at the ER Thursday night for Influenza B. She was given breathing treatments and Prednisone. She still has fever and difficulty breathing. She also cannot stop coughing. Translation No Nurse Assessment Nurse: Hassell Done, RN, Joelene Millin Date/Time Eilene Ghazi Time): 01/07/2022 8:35:32 AM Confirm and document reason for call. If symptomatic, describe symptoms. ---caller states she was seen and dx in the ED on Thursday with influenza B. she states she has a continuous cough and SOB. 99, temporal. Does the patient have any new or worsening symptoms? ---Yes Will a triage be completed? ---Yes Related visit to physician within the last 2 weeks? ---Yes Does the PT have any chronic conditions? (i.e. diabetes, asthma, this includes High risk factors for pregnancy, etc.) ---No Is this a behavioral health or substance abuse call? ---No Guidelines Guideline Title Affirmed Question Affirmed Notes Nurse Date/Time (Eastern Time) Influenza (Flu) Follow-up Call [1] Difficulty breathing AND [2] not severe AND [3] not from stuffy nose (e.g., not relieved by cleaning out the nose) Hassell Done, RN, Joelene Millin 01/07/2022 8:38:31 AM Disp. Time Eilene Ghazi Time) Disposition Final User 01/07/2022 8:34:14 AM Send to Urgent Darcus Austin, Burman Riis PLEASE NOTE: All timestamps contained within this report are represented as [place] Standard Time. CONFIDENTIALTY NOTICE: This fax transmission is intended only for the addressee. It contains information that is legally privileged, confidential or otherwise protected from use or disclosure. If you are not the intended recipient, you are strictly prohibited from reviewing, disclosing, copying using or disseminating any of this information or taking any action in reliance on or regarding this information. If you have received this fax in error, please notify us immediately by telephone so that we can arrange for its return to [place]. Phone: [phone],  Toll-Free: [phone], Fax: [phone] Page: 2 of 2 Call Id: [id number] [place]. Time Eilene Ghazi Time) Disposition Final User 01/07/2022 8:42:51 AM Go to ED Now Yes Hassell Done, RN, Joelene Millin Final Disposition 01/07/2022 8:42:51 AM Go to ED Now Yes Hassell Done, RN, Renea Ee Disagree/Comply Comply Caller Understands Yes PreDisposition Call Doctor Care Advice Given Per Guideline GO TO ED NOW: * You need to be seen in the Emergency Department. * Go to the ED at ___________ [place] now. Drive carefully. * Follow any local or regional Public Health disposition recommendations. WEAR A MASK - COVER YOUR MOUTH AND NOSE: * Wear a mask that fits snuggly over your mouth and nose. ANNOUNCE INFLUENZA ON ARRIVAL: CARE ADVICE given per Influenza Follow-Up Call (Adult) guideline. Referrals [facility] - ED

## 2022-01-08 NOTE — Progress Notes (Signed)
  Transition of Care [facility]) Screening Note   Patient Details  Name: Jennifer Sandoval Date of Birth: 1955-07-06   Transition of Care [facility]) CM/SW Contact:    Dessa Phi, RN Phone Number: 01/08/2022, 12:34 PM    Transition of Care Department [facility]) has reviewed patient and no TOC needs have been identified at this time. We will continue to monitor patient advancement through interdisciplinary progression rounds. If new patient transition needs arise, please place a TOC consult.

## 2022-01-08 NOTE — Progress Notes (Signed)
Mobility Specialist - Progress Note  Pre-mobility: 115 bpm HR, 94% SpO2 During mobility: 127 bpm HR, 91% SpO2 Post-mobility: 125 bpm HR, 93% SPO2   01/08/22 0915  Oxygen Therapy  O2 Device Nasal Cannula  O2 Flow Rate (L/min) 3 L/min  Patient Activity (if Appropriate) Ambulating  Mobility  Activity Ambulated independently in hallway  Level of Assistance Independent  Assistive Device None  Distance Ambulated (ft) 700 ft  Range of Motion/Exercises Active  Activity Response Tolerated well  Mobility Referral Yes  $Mobility charge 1 Mobility   Pt was found in bed and agreeable to ambulate. C/o cough and dry mouth during session and stated feeling worn out at EOS. At EOS returned to bed with all necessities. RN notified of session.  Ferd Hibbs Mobility Specialist

## 2022-01-08 NOTE — Progress Notes (Signed)
PHARMACY - PHYSICIAN COMMUNICATION CRITICAL VALUE ALERT - BLOOD CULTURE IDENTIFICATION (BCID)  Jennifer Sandoval is an 67 y.o. female who presented to [facility] on 01/07/2022 with a chief complaint of shortness of breath, Flu+  Assessment:  1 of 8 bottles from 4 sets of blood cultures with strep pneumo bacteremia  Name of physician (or Provider) Contacted: Ghim[place]  Current antibiotics: Rocephin/Zithromax  Changes to prescribed antibiotics recommended:  Continue rocephin and d/c the zithromax   Results for orders placed or performed during the hospital encounter of 01/07/22  Blood Culture ID Panel (Reflexed) (Collected: 01/07/2022 10:33 AM)  Result Value Ref Range   Enterococcus faecalis NOT DETECTED NOT DETECTED   Enterococcus Faecium NOT DETECTED NOT DETECTED   Listeria monocytogenes NOT DETECTED NOT DETECTED   Staphylococcus species NOT DETECTED NOT DETECTED   Staphylococcus aureus (BCID) NOT DETECTED NOT DETECTED   Staphylococcus epidermidis NOT DETECTED NOT DETECTED   Staphylococcus lugdunensis NOT DETECTED NOT DETECTED   Streptococcus species DETECTED (A) NOT DETECTED   Streptococcus agalactiae NOT DETECTED NOT DETECTED   Streptococcus pneumoniae DETECTED (A) NOT DETECTED   Streptococcus pyogenes NOT DETECTED NOT DETECTED   A.calcoaceticus-baumannii NOT DETECTED NOT DETECTED   Bacteroides fragilis NOT DETECTED NOT DETECTED   Enterobacterales NOT DETECTED NOT DETECTED   Enterobacter cloacae complex NOT DETECTED NOT DETECTED   Escherichia coli NOT DETECTED NOT DETECTED   Klebsiella aerogenes NOT DETECTED NOT DETECTED   Klebsiella oxytoca NOT DETECTED NOT DETECTED   Klebsiella pneumoniae NOT DETECTED NOT DETECTED   Proteus species NOT DETECTED NOT DETECTED   Salmonella species NOT DETECTED NOT DETECTED   Serratia marcescens NOT DETECTED NOT DETECTED   Haemophilus influenzae NOT DETECTED NOT DETECTED   Neisseria meningitidis NOT DETECTED NOT DETECTED   Pseudomonas aeruginosa NOT  DETECTED NOT DETECTED   Stenotrophomonas maltophilia NOT DETECTED NOT DETECTED   Candida albicans NOT DETECTED NOT DETECTED   Candida auris NOT DETECTED NOT DETECTED   Candida glabrata NOT DETECTED NOT DETECTED   Candida krusei NOT DETECTED NOT DETECTED   Candida parapsilosis NOT DETECTED NOT DETECTED   Candida tropicalis NOT DETECTED NOT DETECTED   Cryptococcus neoformans/gattii NOT DETECTED NOT DETECTED    Kara Mead 01/08/2022  1:09 PM

## 2022-01-08 NOTE — Progress Notes (Signed)
PROGRESS NOTE    Jennifer Sandoval  QAS:[id number] DOB: [birth year] DOA: 01/07/2022 PCP: Mosie Lukes, MD    Brief Narrative:  67 year old with history of depression but no other medical issues presented with ongoing shortness of breath for more than 10 days.  She had subjective fever and chills many days ago.  Multiple family members and grandchildren sick with URI.  Also has sinus symptoms with congestion.  Seen in the emergency room on 1/4 tested positive for influenza B treated with steroids and nebulizers with improvement.  She was discharged home with prednisone and albuterol inhaler only to have persistent symptoms and come back to the hospital.   Assessment & Plan:   Influenza B with multifocal pneumonia: Diagnosed influenza B on 1/4.  Persistent dyspnea and repeat chest x-ray with new airspace disease most notable in the right base.  91% on room air. Agree with admission given severity of symptoms. Antibiotics to treat bacterial pneumonia with Rocephin and azithromycin.  Also started on Tamiflu for 5 days. Chest physiotherapy, incentive spirometry, deep breathing exercises, sputum induction, mucolytic's and bronchodilators. Sputum cultures, blood cultures, Legionella and streptococcal antigen. Supplemental oxygen to keep saturations more than 90%.  Depression and anxiety: Continue sertraline.   DVT prophylaxis: enoxaparin (LOVENOX) injection 40 mg Start: 01/07/22 2200   Code Status: Full code Family Communication: None at the bedside Disposition Plan: Status is: Inpatient Remains inpatient appropriate because: Significant wheezing, IV antibiotics     Consultants:  None  Procedures:  None  Antimicrobials:  Rocephin and azithromycin 1/7---   Subjective: Patient was seen and examined.  Anxious with ongoing cough.  Without fever overnight.  Throat hurts because of coughing.  Has not mobilized but feels pretty weak and fatigued.  Objective: Vitals:   01/07/22 2145  01/08/22 0150 01/08/22 0151 01/08/22 0552  BP: 134/87  125/72 139/85  Pulse: 85  84 89  Resp: 20  20 (!) 22  Temp: 98.2 F (36.8 C)  98.4 F (36.9 C) 98.3 F (36.8 C)  TempSrc: Oral  Oral Oral  SpO2: 94% (!) 87% 94% 97%  Weight:      Height:        Intake/Output Summary (Last 24 hours) at 01/08/2022 1102 Last data filed at 01/08/2022 7989 Gross per 24 hour  Intake 2600.47 ml  Output 300 ml  Net 2300.47 ml   Filed Weights   01/07/22 0952 01/07/22 1029  Weight: 57.2 kg 57.9 kg    Examination:  General exam: Appears calm and comfortable at rest.  Anxious with nagging cough. Respiratory system: Mostly conducted upper airway sounds. Cardiovascular system: S1 & S2 heard, RRR.No pedal edema. Gastrointestinal system: Abdomen is nondistended, soft and nontender. No organomegaly or masses felt. Normal bowel sounds heard. Central nervous system: Alert and oriented. No focal neurological deficits. Extremities: Symmetric 5 x 5 power. Skin: No rashes, lesions or ulcers Psychiatry: Judgement and insight appear normal. Mood & affect appropriate.     Data Reviewed: I have personally reviewed following labs and imaging studies  CBC: Recent Labs  Lab 01/04/22 0214 01/07/22 1031 01/08/22 0531  WBC 8.5 7.2 8.6  NEUTROABS 7.2 6.5  --   HGB 11.7* 12.6 11.0*  HCT 36.7 37.9 34.2*  MCV 92.7 90.2 92.2  PLT 150 260 211   Basic Metabolic Panel: Recent Labs  Lab 01/04/22 0214 01/07/22 1031 01/08/22 0531  NA 138 136 138  K 2.6* 3.9 4.1  CL 106 106 107  CO2 22 21* 25  GLUCOSE 152* 84  90  BUN '12 18 23  '$ CREATININE 0.82 0.60 0.54  CALCIUM 7.6* 8.3* 8.2*   GFR: Estimated Creatinine Clearance: 55.4 mL/min (by C-G formula based on SCr of 0.54 mg/dL). Liver Function Tests: Recent Labs  Lab 01/04/22 0214 01/07/22 1031  AST 28 35  ALT 15 27  ALKPHOS 71 69  BILITOT 0.5 1.1  PROT 5.9* 6.2*  ALBUMIN 3.3* 2.6*   No results for input(s): "LIPASE", "AMYLASE" in the last 168 hours. No  results for input(s): "AMMONIA" in the last 168 hours. Coagulation Profile: Recent Labs  Lab 01/04/22 0214 01/07/22 1031  INR 1.0 1.4*   Cardiac Enzymes: No results for input(s): "CKTOTAL", "CKMB", "CKMBINDEX", "TROPONINI" in the last 168 hours. BNP (last 3 results) No results for input(s): "PROBNP" in the last 8760 hours. HbA1C: No results for input(s): "HGBA1C" in the last 72 hours. CBG: No results for input(s): "GLUCAP" in the last 168 hours. Lipid Profile: No results for input(s): "CHOL", "HDL", "LDLCALC", "TRIG", "CHOLHDL", "LDLDIRECT" in the last 72 hours. Thyroid Function Tests: No results for input(s): "TSH", "T4TOTAL", "FREET4", "T3FREE", "THYROIDAB" in the last 72 hours. Anemia Panel: No results for input(s): "VITAMINB12", "FOLATE", "FERRITIN", "TIBC", "IRON", "RETICCTPCT" in the last 72 hours. Sepsis Labs: Recent Labs  Lab 01/04/22 0213 01/07/22 1031 01/08/22 0531  PROCALCITON  --   --  7.24  LATICACIDVEN 1.7 1.5  --     Recent Results (from the past 240 hour(s))  Resp panel by RT-PCR (RSV, Flu A&B, Covid) Anterior Nasal Swab     Status: Abnormal   Collection Time: 01/04/22  1:46 AM   Specimen: Anterior Nasal Swab  Result Value Ref Range Status   SARS Coronavirus 2 by RT PCR NEGATIVE NEGATIVE Final    Comment: (NOTE) SARS-CoV-2 target nucleic acids are NOT DETECTED.  The SARS-CoV-2 RNA is generally detectable in upper respiratory specimens during the acute phase of infection. The lowest concentration of SARS-CoV-2 viral copies this assay can detect is 138 copies/mL. A negative result does not preclude SARS-Cov-2 infection and should not be used as the sole basis for treatment or other patient management decisions. A negative result may occur with  improper specimen collection/handling, submission of specimen other than nasopharyngeal swab, presence of viral mutation(s) within the areas targeted by this assay, and inadequate number of viral copies(<138  copies/mL). A negative result must be combined with clinical observations, patient history, and epidemiological information. The expected result is Negative.  Fact Sheet for Patients:  EntrepreneurPulse.com.au  Fact Sheet for Healthcare Providers:  IncredibleEmployment.be  This test is no t yet approved or cleared by the [place] FDA and  has been authorized for detection and/or diagnosis of SARS-CoV-2 by FDA under an Emergency Use Authorization (EUA). This EUA will remain  in effect (meaning this test can be used) for the duration of the COVID-19 declaration under Section 564(b)(1) of the Act, 21 U.S.C.section 360bbb-3(b)(1), unless the authorization is terminated  or revoked sooner.       Influenza A by PCR NEGATIVE NEGATIVE Final   Influenza B by PCR POSITIVE (A) NEGATIVE Final    Comment: (NOTE) The Xpert Xpress SARS-CoV-2/FLU/RSV plus assay is intended as an aid in the diagnosis of influenza from Nasopharyngeal swab specimens and should not be used as a sole basis for treatment. Nasal washings and aspirates are unacceptable for Xpert Xpress SARS-CoV-2/FLU/RSV testing.  Fact Sheet for Patients: EntrepreneurPulse.com.au  Fact Sheet for Healthcare Providers: IncredibleEmployment.be  This test is not yet approved or cleared by the  [place] Architectural technologist and has been authorized for detection and/or diagnosis of SARS-CoV-2 by FDA under an Print production planner (EUA). This EUA will remain in effect (meaning this test can be used) for the duration of the COVID-19 declaration under Section 564(b)(1) of the Act, 21 U.S.C. section 360bbb-3(b)(1), unless the authorization is terminated or revoked.     Resp Syncytial Virus by PCR NEGATIVE NEGATIVE Final    Comment: (NOTE) Fact Sheet for Patients: EntrepreneurPulse.com.au  Fact Sheet for Healthcare  Providers: IncredibleEmployment.be  This test is not yet approved or cleared by the [place] FDA and has been authorized for detection and/or diagnosis of SARS-CoV-2 by FDA under an Emergency Use Authorization (EUA). This EUA will remain in effect (meaning this test can be used) for the duration of the COVID-19 declaration under Section 564(b)(1) of the Act, 21 U.S.C. section 360bbb-3(b)(1), unless the authorization is terminated or revoked.  Performed at [facility], [address] [place], [place] [address]   Culture, blood (Routine x 2)     Status: None (Preliminary result)   Collection Time: 01/04/22  1:52 AM   Specimen: BLOOD  Result Value Ref Range Status   Specimen Description   Final    BLOOD LEFT ANTECUBITAL Performed at [facility], [place] [address], [place], [place] [address]    Special Requests   Final    BOTTLES DRAWN AEROBIC AND ANAEROBIC Blood Culture adequate volume Performed at [facility], [address] [place], [place] [address]    Culture   Final    NO GROWTH 4 DAYS Performed at [facility], [place] [address] [place], [place] [address]    Report Status PENDING  Incomplete  Culture, blood (Routine x 2)     Status: None (Preliminary result)   Collection Time: 01/04/22  1:58 AM   Specimen: BLOOD  Result Value Ref Range Status   Specimen Description   Final    BLOOD RIGHT ANTECUBITAL Performed at [facility], [place] [address], [place], [place] [address]    Special Requests   Final    BOTTLES DRAWN AEROBIC AND ANAEROBIC Blood Culture adequate volume Performed at [facility], [address] [place], [place] [address]    Culture   Final    NO GROWTH 4 DAYS Performed at [facility], [place] [address] [place], [place] [address]    Report Status PENDING  Incomplete  Culture, blood (Routine x 2)     Status: None (Preliminary result)   Collection Time: 01/07/22 10:33 AM    Specimen: BLOOD RIGHT FOREARM  Result Value Ref Range Status   Specimen Description   Final    BLOOD RIGHT FOREARM BLOOD Performed at [facility], [address] [place], [place] [address]    Special Requests   Final    Blood Culture results may not be optimal due to an inadequate volume of blood received in culture bottles [place] Performed at [facility], [address] [place], [place] [address]    Culture   Final    NO GROWTH < 24 HOURS Performed at [facility], [place] [address] [place], [place] [address]    Report Status PENDING  Incomplete  Culture, blood (Routine x 2)     Status: None (Preliminary result)   Collection Time: 01/07/22 10:33 AM   Specimen: BLOOD LEFT FOREARM  Result  Value Ref Range Status   Specimen Description   Final    BLOOD LEFT FOREARM Performed at [facility], [place] [address] [place], [place] [address]    Special Requests   Final    Blood Culture results may not be optimal due to an inadequate volume of blood received in culture bottles BOTTLES DRAWN AEROBIC AND ANAEROBIC Performed at [facility], [address] [place], [place] [address]    Culture  Setup Time   Final    GRAM POSITIVE COCCI ANAEROBIC BOTTLE ONLY Organism ID to follow    Culture   Final    NO GROWTH < 24 HOURS Performed at [facility], [place] [address], [place], [place] [address]    Report Status PENDING  Incomplete         Radiology Studies: DG Chest Port 1 View  Result Date: 01/07/2022 CLINICAL DATA:  Dry cough with fever and shortness of breath. Recent diagnosis of flu EXAM: PORTABLE CHEST 1 VIEW COMPARISON:  01/04/2022 FINDINGS: Bilateral airspace disease densest at the right base and in the left mid lung. No cavitation or effusion is seen. No pulmonary edema. Stable heart size and mediastinal contours. IMPRESSION: Multifocal/bilateral pneumonia. Electronically Signed   By: Jorje Guild  M.D.   On: 01/07/2022 10:23        Scheduled Meds:  enoxaparin (LOVENOX) injection  40 mg Subcutaneous QHS   guaiFENesin  600 mg Oral BID   oseltamivir  30 mg Oral BID   sertraline  150 mg Oral QHS   sodium chloride flush  3 mL Intravenous Q12H   Continuous Infusions:  sodium chloride Stopped (01/07/22 1506)   azithromycin     cefTRIAXone (ROCEPHIN)  IV       LOS: 0 days    Time spent: 35 minutes    Barb Merino, MD [facility] Pager [phone]

## 2022-01-08 NOTE — Telephone Encounter (Signed)
Pt admitted

## 2022-01-09 ENCOUNTER — Ambulatory Visit: Payer: PPO | Admitting: Family Medicine

## 2022-01-09 DIAGNOSIS — J189 Pneumonia, unspecified organism: Secondary | ICD-10-CM | POA: Diagnosis not present

## 2022-01-09 DIAGNOSIS — R7881 Bacteremia: Secondary | ICD-10-CM | POA: Diagnosis present

## 2022-01-09 DIAGNOSIS — B953 Streptococcus pneumoniae as the cause of diseases classified elsewhere: Secondary | ICD-10-CM | POA: Diagnosis present

## 2022-01-09 LAB — CULTURE, BLOOD (ROUTINE X 2)
Culture: NO GROWTH
Culture: NO GROWTH
Special Requests: ADEQUATE
Special Requests: ADEQUATE

## 2022-01-09 MED ORDER — ADULT MULTIVITAMIN W/MINERALS CH
1.0000 | ORAL_TABLET | Freq: Every day | ORAL | Status: DC
  Administered 2022-01-09 – 2022-01-14 (×6): 1 via ORAL
  Filled 2022-01-09 (×6): qty 1

## 2022-01-09 MED ORDER — LIP MEDEX EX OINT
TOPICAL_OINTMENT | CUTANEOUS | Status: DC | PRN
  Administered 2022-01-09: 75 via TOPICAL
  Filled 2022-01-09: qty 7

## 2022-01-09 MED ORDER — BENZONATATE 100 MG PO CAPS
100.0000 mg | ORAL_CAPSULE | Freq: Three times a day (TID) | ORAL | Status: DC
  Administered 2022-01-09 – 2022-01-14 (×15): 100 mg via ORAL
  Filled 2022-01-09 (×15): qty 1

## 2022-01-09 MED ORDER — SODIUM CHLORIDE 0.9 % IV SOLN
2.0000 g | INTRAVENOUS | Status: DC
  Administered 2022-01-09 – 2022-01-13 (×5): 2 g via INTRAVENOUS
  Filled 2022-01-09 (×5): qty 20

## 2022-01-09 NOTE — Progress Notes (Signed)
Initial Nutrition Assessment  INTERVENTION:   -Multivitamin with minerals daily  -Checking Vitamin D and Vitamin B-12 labs in AM given history of gastric bypass surgery.  -Encourage PO as tolerated  -Patient to request protein supplements from unit as desired.  NUTRITION DIAGNOSIS:   Increased nutrient needs related to acute illness as evidenced by estimated needs.  GOAL:   Patient will meet greater than or equal to 90% of their needs  MONITOR:   PO intake, Labs, Weight trends, I & O's  REASON FOR ASSESSMENT:   Malnutrition Screening Tool    ASSESSMENT:   67 year old with history of depression but no other medical issues presented with ongoing shortness of breath for more than 10 days. Admitted with influenza B, pneumonia and bronchitis.  Patient in room, sleeping in room. Pt states she has poor appetite since becoming sick ~10 days ago. Pt with h/o gastric bypass. States she takes her multivitamins and calcium. Has not had her Vitamin D or B-12 levels checked in a while, has a history of B-12 deficiency.  States that when she feels well, she typically will eat a bagel or some eggs for breakfast, for lunch she has soup and a salad. Dinner varies, states "something for the kids to eat".  Pt currently consuming ~25% of meals. Pt told to request Ensure from unit if continues to eat poorly.   Per weight records, no significant weight changes recently.   Medications reviewed.  Labs reviewed.  NUTRITION - FOCUSED PHYSICAL EXAM:  No depletions noted.  Diet Order:   Diet Order             Diet regular Fluid consistency: Thin  Diet effective now                   EDUCATION NEEDS:   No education needs have been identified at this time  Skin:  Skin Assessment: Reviewed RN Assessment  Last BM:  1/8  Height:   Ht Readings from Last 1 Encounters:  01/07/22 '5\' 2"'$  (1.575 m)    Weight:   Wt Readings from Last 1 Encounters:  01/07/22 57.9 kg    BMI:  Body  mass index is 23.35 kg/m.  Estimated Nutritional Needs:   Kcal:  1600-1800  Protein:  70-85g  Fluid:  1.8L/day  Clayton Bibles, MS, RD, LDN Inpatient Clinical Dietitian Contact information available via Amion

## 2022-01-09 NOTE — Plan of Care (Signed)
  Problem: Education: Goal: Knowledge of General Education information will improve Description: Including pain rating scale, medication(s)/side effects and non-pharmacologic comfort measures Outcome: Progressing   Problem: Clinical Measurements: Goal: Diagnostic test results will improve Outcome: Progressing   Problem: Coping: Goal: Level of anxiety will decrease Outcome: Progressing   

## 2022-01-09 NOTE — Progress Notes (Addendum)
PROGRESS NOTE    Jennifer Sandoval  WUX:[id number] DOB: 10-17-1955 DOA: 01/07/2022 PCP: Mosie Lukes, MD    Brief Narrative:  67 year old with history of depression but no other medical issues presented with ongoing shortness of breath for more than 10 days.  She had subjective fever and chills many days ago.  Multiple family members and grandchildren sick with URI.  Also has sinus symptoms with congestion.  Seen in the emergency room on 1/4 tested positive for influenza B treated with steroids and nebulizers with improvement.  She was discharged home with prednisone and albuterol inhaler only to have persistent symptoms and come back to the hospital. Positive for influenza B, positive for pneumococcal urine antigen as well as bacteremia.   Assessment & Plan:   Influenza B with superadded multifocal pneumonia with streptococcal pneumonia. Diagnosed influenza B on 1/4.  Persistent dyspnea and repeat chest x-ray with new airspace disease most notable in the right base.  Mostly on room air. Blood cultures positive for strep pneumonia.  Urine antigen positive for strep pneumonia. Initially on Rocephin and azithromycin.  Will continue Rocephin. Also started on Tamiflu for 5 days. Chest physiotherapy, incentive spirometry, deep breathing exercises, sputum induction, mucolytic's and bronchodilators. Supplemental oxygen to keep saturations more than 90%. Addendum: case discussed with infectious disease team.  With her streptococcal pneumonia bacteremia, there is no indication to check for endocarditis unless repeat blood cultures are positive.  Will order repeat blood cultures today to ensure clearance of bacteremia.  Depression and anxiety: Continue sertraline.  Mobilize.  Due to significant symptoms will still need IV antibiotics today.  If adequate clinical improvement, can change to oral antibiotic tomorrow to complete 7 days of therapy for history of pneumonia.   DVT prophylaxis: enoxaparin  (LOVENOX) injection 40 mg Start: 01/07/22 2200   Code Status: Full code Family Communication: None at the bedside Disposition Plan: Status is: Inpatient Remains inpatient appropriate because: Significant symptoms.  Bacteremia.  IV antibiotics.     Consultants:  None  Procedures:  None  Antimicrobials:  azithromycin 1/7--- 1 8 Rocephin 1/7----   Subjective: Patient seen and examined.  Complains of persistent cough.  She had some blood mixed nasal secretions mostly on clearing the nose.  Otherwise the sputum is mostly mucoid.  She is tired of coughing.  Remains afebrile.  Objective: Vitals:   01/08/22 1540 01/08/22 1740 01/08/22 2123 01/09/22 0057  BP: 118/77 120/78 120/74 111/69  Pulse: 88 88 92 93  Resp: (!) 26 (!) 24 (!) 22 20  Temp: 99.3 F (37.4 C) 99.3 F (37.4 C) 99.2 F (37.3 C) 99.6 F (37.6 C)  TempSrc: Oral Oral Oral Oral  SpO2: 95% 98% 93% 93%  Weight:      Height:        Intake/Output Summary (Last 24 hours) at 01/09/2022 1239 Last data filed at 01/08/2022 2300 Gross per 24 hour  Intake 716.24 ml  Output 800 ml  Net -83.76 ml    Filed Weights   01/07/22 0952 01/07/22 1029  Weight: 57.2 kg 57.9 kg    Examination:  General exam: Anxious with nagging cough. Respiratory system: Mostly conducted upper airway sounds. Cardiovascular system: S1 & S2 heard, RRR.No pedal edema. Gastrointestinal system: Abdomen is nondistended, soft and nontender. No organomegaly or masses felt. Normal bowel sounds heard. Central nervous system: Alert and oriented. No focal neurological deficits. Extremities: Symmetric 5 x 5 power. Skin: No rashes, lesions or ulcers Psychiatry: Judgement and insight appear normal. Mood & affect appropriate.  Data Reviewed: I have personally reviewed following labs and imaging studies  CBC: Recent Labs  Lab 01/04/22 0214 01/07/22 1031 01/08/22 0531  WBC 8.5 7.2 8.6  NEUTROABS 7.2 6.5  --   HGB 11.7* 12.6 11.0*  HCT 36.7 37.9  34.2*  MCV 92.7 90.2 92.2  PLT 150 260 588    Basic Metabolic Panel: Recent Labs  Lab 01/04/22 0214 01/07/22 1031 01/08/22 0531  NA 138 136 138  K 2.6* 3.9 4.1  CL 106 106 107  CO2 22 21* 25  GLUCOSE 152* 84 90  BUN '12 18 23  '$ CREATININE 0.82 0.60 0.54  CALCIUM 7.6* 8.3* 8.2*    GFR: Estimated Creatinine Clearance: 55.4 mL/min (by C-G formula based on SCr of 0.54 mg/dL). Liver Function Tests: Recent Labs  Lab 01/04/22 0214 01/07/22 1031  AST 28 35  ALT 15 27  ALKPHOS 71 69  BILITOT 0.5 1.1  PROT 5.9* 6.2*  ALBUMIN 3.3* 2.6*    No results for input(s): "LIPASE", "AMYLASE" in the last 168 hours. No results for input(s): "AMMONIA" in the last 168 hours. Coagulation Profile: Recent Labs  Lab 01/04/22 0214 01/07/22 1031  INR 1.0 1.4*    Cardiac Enzymes: No results for input(s): "CKTOTAL", "CKMB", "CKMBINDEX", "TROPONINI" in the last 168 hours. BNP (last 3 results) No results for input(s): "PROBNP" in the last 8760 hours. HbA1C: No results for input(s): "HGBA1C" in the last 72 hours. CBG: No results for input(s): "GLUCAP" in the last 168 hours. Lipid Profile: No results for input(s): "CHOL", "HDL", "LDLCALC", "TRIG", "CHOLHDL", "LDLDIRECT" in the last 72 hours. Thyroid Function Tests: No results for input(s): "TSH", "T4TOTAL", "FREET4", "T3FREE", "THYROIDAB" in the last 72 hours. Anemia Panel: No results for input(s): "VITAMINB12", "FOLATE", "FERRITIN", "TIBC", "IRON", "RETICCTPCT" in the last 72 hours. Sepsis Labs: Recent Labs  Lab 01/04/22 0213 01/07/22 1031 01/08/22 0531  PROCALCITON  --   --  7.24  LATICACIDVEN 1.7 1.5  --      Recent Results (from the past 240 hour(s))  Resp panel by RT-PCR (RSV, Flu A&B, Covid) Anterior Nasal Swab     Status: Abnormal   Collection Time: 01/04/22  1:46 AM   Specimen: Anterior Nasal Swab  Result Value Ref Range Status   SARS Coronavirus 2 by RT PCR NEGATIVE NEGATIVE Final    Comment: (NOTE) SARS-CoV-2 target  nucleic acids are NOT DETECTED.  The SARS-CoV-2 RNA is generally detectable in upper respiratory specimens during the acute phase of infection. The lowest concentration of SARS-CoV-2 viral copies this assay can detect is 138 copies/mL. A negative result does not preclude SARS-Cov-2 infection and should not be used as the sole basis for treatment or other patient management decisions. A negative result may occur with  improper specimen collection/handling, submission of specimen other than nasopharyngeal swab, presence of viral mutation(s) within the areas targeted by this assay, and inadequate number of viral copies(<138 copies/mL). A negative result must be combined with clinical observations, patient history, and epidemiological information. The expected result is Negative.  Fact Sheet for Patients:  EntrepreneurPulse.com.au  Fact Sheet for Healthcare Providers:  IncredibleEmployment.be  This test is no t yet approved or cleared by the [place] FDA and  has been authorized for detection and/or diagnosis of SARS-CoV-2 by FDA under an Emergency Use Authorization (EUA). This EUA will remain  in effect (meaning this test can be used) for the duration of the COVID-19 declaration under Section 564(b)(1) of the Act, 21 U.S.C.section 360bbb-3(b)(1), unless the authorization is  terminated  or revoked sooner.       Influenza A by PCR NEGATIVE NEGATIVE Final   Influenza B by PCR POSITIVE (A) NEGATIVE Final    Comment: (NOTE) The Xpert Xpress SARS-CoV-2/FLU/RSV plus assay is intended as an aid in the diagnosis of influenza from Nasopharyngeal swab specimens and should not be used as a sole basis for treatment. Nasal washings and aspirates are unacceptable for Xpert Xpress SARS-CoV-2/FLU/RSV testing.  Fact Sheet for Patients: EntrepreneurPulse.com.au  Fact Sheet for Healthcare  Providers: IncredibleEmployment.be  This test is not yet approved or cleared by the [place] FDA and has been authorized for detection and/or diagnosis of SARS-CoV-2 by FDA under an Emergency Use Authorization (EUA). This EUA will remain in effect (meaning this test can be used) for the duration of the COVID-19 declaration under Section 564(b)(1) of the Act, 21 U.S.C. section 360bbb-3(b)(1), unless the authorization is terminated or revoked.     Resp Syncytial Virus by PCR NEGATIVE NEGATIVE Final    Comment: (NOTE) Fact Sheet for Patients: EntrepreneurPulse.com.au  Fact Sheet for Healthcare Providers: IncredibleEmployment.be  This test is not yet approved or cleared by the [place] FDA and has been authorized for detection and/or diagnosis of SARS-CoV-2 by FDA under an Emergency Use Authorization (EUA). This EUA will remain in effect (meaning this test can be used) for the duration of the COVID-19 declaration under Section 564(b)(1) of the Act, 21 U.S.C. section 360bbb-3(b)(1), unless the authorization is terminated or revoked.  Performed at [facility], [address] [place], [place] [address]   Culture, blood (Routine x 2)     Status: None   Collection Time: 01/04/22  1:52 AM   Specimen: BLOOD  Result Value Ref Range Status   Specimen Description   Final    BLOOD LEFT ANTECUBITAL Performed at [facility], [place] [address], [place], [place] [address]    Special Requests   Final    BOTTLES DRAWN AEROBIC AND ANAEROBIC Blood Culture adequate volume Performed at [facility], [address] [place], [place] [address]    Culture   Final    NO GROWTH 5 DAYS Performed at [facility], [place] [address], [place], [place] [address]    Report Status 01/09/2022 FINAL  Final  Culture, blood (Routine x 2)     Status: None   Collection Time: 01/04/22  1:58 AM   Specimen: BLOOD   Result Value Ref Range Status   Specimen Description   Final    BLOOD RIGHT ANTECUBITAL Performed at [facility], [place] [address], [place], [place] [address]    Special Requests   Final    BOTTLES DRAWN AEROBIC AND ANAEROBIC Blood Culture adequate volume Performed at [facility], [address] [place], [place] [address]    Culture   Final    NO GROWTH 5 DAYS Performed at [facility], [place] [address] [place], [place] [address]    Report Status 01/09/2022 FINAL  Final  Culture, blood (Routine x 2)     Status: None (Preliminary result)   Collection Time: 01/07/22 10:33 AM   Specimen: BLOOD RIGHT FOREARM  Result Value Ref Range Status   Specimen Description   Final    BLOOD RIGHT FOREARM BLOOD Performed at [facility], [address] [place], [place] [address]    Special Requests   Final    Blood Culture results may not  be optimal due to an inadequate volume of blood received in culture bottles BOTTLES DRAWN AEROBIC AND ANAEROBIC Performed at [facility], [address] [place], [place] [address]    Culture   Final    NO GROWTH 2 DAYS Performed at [facility], [place] [address], [place], [place] [address]    Report Status PENDING  Incomplete  Culture, blood (Routine x 2)     Status: Abnormal (Preliminary result)   Collection Time: 01/07/22 10:33 AM   Specimen: BLOOD LEFT FOREARM  Result Value Ref Range Status   Specimen Description   Final    BLOOD LEFT FOREARM Performed at [facility], [place] [address] [place], [place] [address]    Special Requests   Final    Blood Culture results may not be optimal due to an inadequate volume of blood received in culture bottles BOTTLES DRAWN AEROBIC AND ANAEROBIC Performed at [facility], [address] [place], [place] [address]    Culture  Setup Time   Final    GRAM POSITIVE COCCI ANAEROBIC BOTTLE ONLY CRITICAL RESULT CALLED TO, READ BACK BY AND VERIFIED WITH:  PHARMD J LEGGE [id number] AT 1302 BY CM    Culture (A)  Final    STREPTOCOCCUS PNEUMONIAE SUSCEPTIBILITIES TO FOLLOW Performed at [facility], [place] [address] [place], [place] [address]    Report Status PENDING  Incomplete  Blood Culture ID Panel (Reflexed)     Status: Abnormal   Collection Time: 01/07/22 10:33 AM  Result Value Ref Range Status   Enterococcus faecalis NOT DETECTED NOT DETECTED Final   Enterococcus Faecium NOT DETECTED NOT DETECTED Final   Listeria monocytogenes NOT DETECTED NOT DETECTED Final   Staphylococcus species NOT DETECTED NOT DETECTED Final   Staphylococcus aureus (BCID) NOT DETECTED NOT DETECTED Final   Staphylococcus epidermidis NOT DETECTED NOT DETECTED Final   Staphylococcus lugdunensis NOT DETECTED NOT DETECTED Final   Streptococcus species DETECTED (A) NOT DETECTED Final    Comment: CRITICAL RESULT CALLED TO, READ BACK BY AND VERIFIED WITH: PHARMD J LEGGE [id number] AT 1302 BY CM    Streptococcus agalactiae NOT DETECTED NOT DETECTED Final   Streptococcus pneumoniae DETECTED (A) NOT DETECTED Final    Comment: CRITICAL RESULT CALLED TO, READ BACK BY AND VERIFIED WITH: PHARMD J LEGGE [id number] AT 1302 BY CM    Streptococcus pyogenes NOT DETECTED NOT DETECTED Final   A.calcoaceticus-baumannii NOT DETECTED NOT DETECTED Final   Bacteroides fragilis NOT DETECTED NOT DETECTED Final   Enterobacterales NOT DETECTED NOT DETECTED Final   Enterobacter cloacae complex NOT DETECTED NOT DETECTED Final   Escherichia coli NOT DETECTED NOT DETECTED Final   Klebsiella aerogenes NOT DETECTED NOT DETECTED Final   Klebsiella oxytoca NOT DETECTED NOT DETECTED Final   Klebsiella pneumoniae NOT DETECTED NOT DETECTED Final   Proteus species NOT DETECTED NOT DETECTED Final   Salmonella species NOT DETECTED NOT DETECTED Final   Serratia marcescens NOT DETECTED NOT DETECTED Final   Haemophilus influenzae NOT DETECTED NOT DETECTED Final   Neisseria meningitidis NOT DETECTED NOT  DETECTED Final   Pseudomonas aeruginosa NOT DETECTED NOT DETECTED Final   Stenotrophomonas maltophilia NOT DETECTED NOT DETECTED Final   Candida albicans NOT DETECTED NOT DETECTED Final   Candida auris NOT DETECTED NOT DETECTED Final   Candida glabrata NOT DETECTED NOT DETECTED Final   Candida krusei NOT DETECTED NOT DETECTED Final   Candida parapsilosis NOT DETECTED NOT DETECTED Final  Candida tropicalis NOT DETECTED NOT DETECTED Final   Cryptococcus neoformans/gattii NOT DETECTED NOT DETECTED Final    Comment: Performed at [facility], [place] [address], [place], [place] [address]         Radiology Studies: No results found.      Scheduled Meds:  enoxaparin (LOVENOX) injection  40 mg Subcutaneous QHS   guaiFENesin  600 mg Oral BID   multivitamin with minerals  1 tablet Oral Daily   oseltamivir  30 mg Oral BID   sertraline  150 mg Oral QHS   sodium chloride flush  3 mL Intravenous Q12H   Continuous Infusions:  sodium chloride Stopped (01/07/22 1506)   azithromycin 500 mg (01/08/22 1243)   cefTRIAXone (ROCEPHIN)  IV 2 g (01/09/22 1222)     LOS: 1 day    Time spent: 35 minutes    Barb Merino, MD [facility] Pager [phone]

## 2022-01-10 DIAGNOSIS — J189 Pneumonia, unspecified organism: Secondary | ICD-10-CM | POA: Diagnosis not present

## 2022-01-10 LAB — CBC WITH DIFFERENTIAL/PLATELET
Abs Immature Granulocytes: 1.4 10*3/uL — ABNORMAL HIGH (ref 0.00–0.07)
Basophils Absolute: 0 10*3/uL (ref 0.0–0.1)
Basophils Relative: 0 %
Eosinophils Absolute: 0.2 10*3/uL (ref 0.0–0.5)
Eosinophils Relative: 1 %
HCT: 33.7 % — ABNORMAL LOW (ref 36.0–46.0)
Hemoglobin: 10.8 g/dL — ABNORMAL LOW (ref 12.0–15.0)
Lymphocytes Relative: 8 %
Lymphs Abs: 1.4 10*3/uL (ref 0.7–4.0)
MCH: 29.9 pg (ref 26.0–34.0)
MCHC: 32 g/dL (ref 30.0–36.0)
MCV: 93.4 fL (ref 80.0–100.0)
Metamyelocytes Relative: 2 %
Monocytes Absolute: 0.2 10*3/uL (ref 0.1–1.0)
Monocytes Relative: 1 %
Myelocytes: 6 %
Neutro Abs: 14.2 10*3/uL — ABNORMAL HIGH (ref 1.7–7.7)
Neutrophils Relative %: 82 %
Platelets: 368 10*3/uL (ref 150–400)
RBC: 3.61 MIL/uL — ABNORMAL LOW (ref 3.87–5.11)
RDW: 13.8 % (ref 11.5–15.5)
WBC: 17.3 10*3/uL — ABNORMAL HIGH (ref 4.0–10.5)
nRBC: 0 % (ref 0.0–0.2)

## 2022-01-10 LAB — EXPECTORATED SPUTUM ASSESSMENT W GRAM STAIN, RFLX TO RESP C

## 2022-01-10 LAB — CULTURE, BLOOD (ROUTINE X 2)

## 2022-01-10 LAB — VITAMIN B12: Vitamin B-12: 905 pg/mL (ref 180–914)

## 2022-01-10 MED ORDER — FLUTICASONE PROPIONATE 50 MCG/ACT NA SUSP
1.0000 | Freq: Every day | NASAL | Status: AC
  Administered 2022-01-10: 1 via NASAL
  Filled 2022-01-10: qty 16

## 2022-01-10 NOTE — Progress Notes (Signed)
PROGRESS NOTE    Jennifer Sandoval  OMV:[id number] DOB: 1955/04/10 DOA: 01/07/2022 PCP: Mosie Lukes, MD    Brief Narrative:  67 year old with history of depression but no other medical issues presented with ongoing shortness of breath for more than 10 days.  She had subjective fever and chills many days ago.  Multiple family members and grandchildren sick with URI.  Also has sinus symptoms with congestion.  Seen in the emergency room on 1/4 tested positive for influenza B treated with steroids and nebulizers with improvement.  She was discharged home with prednisone and albuterol inhaler only to have persistent symptoms and came back to the hospital. Positive for influenza B, positive for pneumococcal urine antigen as well as pneumococcal bacteremia.   Assessment & Plan:   Influenza B with superadded multifocal pneumonia with streptococcal pneumonia. Diagnosed influenza B on 1/4.  Persistent dyspnea and repeat chest x-ray with new airspace disease most notable in the right base.  Mostly on room air. Blood cultures positive for strep pneumonia.  Urine antigen positive for strep pneumonia. Initially on Rocephin and azithromycin.  Will continue Rocephin. Also started on Tamiflu for 5 days. Chest physiotherapy, incentive spirometry, deep breathing exercises, sputum induction, mucolytic's and bronchodilators. Supplemental oxygen to keep saturations more than 90%. case discussed with infectious disease team.  With her streptococcal pneumonia bacteremia, there is no indication to check for endocarditis unless repeat blood cultures are positive.  Repeat blood cultures 1/9, no growths so far .  If no growth by 48 hours, will treat with oral antibiotics.  Depression and anxiety: Continue sertraline.  Mobilize.  Due to significant symptoms will still need IV antibiotics today.  If adequate clinical improvement, can change to oral antibiotic tomorrow to complete 7 days of therapy for streptococcal  pneumonia.   DVT prophylaxis: enoxaparin (LOVENOX) injection 40 mg Start: 01/07/22 2200   Code Status: Full code Family Communication: None at the bedside Disposition Plan: Status is: Inpatient Remains inpatient appropriate because: Significant symptoms.  Bacteremia.  IV antibiotics.     Consultants:  None  Procedures:  None  Antimicrobials:  azithromycin 1/7--- 1 8 Rocephin 1/7----   Subjective:  Patient seen and examined.  Cough is better after around-the-clock Tessalon Perles, Hycodan and Mucinex.  Low-grade fever overnight but overall feels her strength is improving.  Objective: Vitals:   01/09/22 1544 01/09/22 2137 01/10/22 0645 01/10/22 0650  BP: 120/77 (!) 113/90 107/61   Pulse: 84 92 83   Resp: 20 20 (!) 24   Temp: 99 F (37.2 C) (!) 100.6 F (38.1 C)  98.7 F (37.1 C)  TempSrc: Oral Oral  Oral  SpO2:  97% 95%   Weight:      Height:        Intake/Output Summary (Last 24 hours) at 01/10/2022 1104 Last data filed at 01/10/2022 0900 Gross per 24 hour  Intake 583.76 ml  Output --  Net 583.76 ml   Filed Weights   01/07/22 0952 01/07/22 1029  Weight: 57.2 kg 57.9 kg    Examination:  General exam: Looks comfortable today.  On minimal oxygen. Respiratory system: Mostly conducted upper airway sounds.  No added sounds. Cardiovascular system: S1 & S2 heard, RRR.No pedal edema. Gastrointestinal system: Abdomen is nondistended, soft and nontender. No organomegaly or masses felt. Normal bowel sounds heard. Central nervous system: Alert and oriented. No focal neurological deficits. Extremities: Symmetric 5 x 5 power. Skin: No rashes, lesions or ulcers Psychiatry: Judgement and insight appear normal. Mood & affect appropriate.  Data Reviewed: I have personally reviewed following labs and imaging studies  CBC: Recent Labs  Lab 01/04/22 0214 01/07/22 1031 01/08/22 0531  WBC 8.5 7.2 8.6  NEUTROABS 7.2 6.5  --   HGB 11.7* 12.6 11.0*  HCT 36.7 37.9  34.2*  MCV 92.7 90.2 92.2  PLT 150 260 277   Basic Metabolic Panel: Recent Labs  Lab 01/04/22 0214 01/07/22 1031 01/08/22 0531  NA 138 136 138  K 2.6* 3.9 4.1  CL 106 106 107  CO2 22 21* 25  GLUCOSE 152* 84 90  BUN '12 18 23  '$ CREATININE 0.82 0.60 0.54  CALCIUM 7.6* 8.3* 8.2*   GFR: Estimated Creatinine Clearance: 55.4 mL/min (by C-G formula based on SCr of 0.54 mg/dL). Liver Function Tests: Recent Labs  Lab 01/04/22 0214 01/07/22 1031  AST 28 35  ALT 15 27  ALKPHOS 71 69  BILITOT 0.5 1.1  PROT 5.9* 6.2*  ALBUMIN 3.3* 2.6*   No results for input(s): "LIPASE", "AMYLASE" in the last 168 hours. No results for input(s): "AMMONIA" in the last 168 hours. Coagulation Profile: Recent Labs  Lab 01/04/22 0214 01/07/22 1031  INR 1.0 1.4*   Cardiac Enzymes: No results for input(s): "CKTOTAL", "CKMB", "CKMBINDEX", "TROPONINI" in the last 168 hours. BNP (last 3 results) No results for input(s): "PROBNP" in the last 8760 hours. HbA1C: No results for input(s): "HGBA1C" in the last 72 hours. CBG: No results for input(s): "GLUCAP" in the last 168 hours. Lipid Profile: No results for input(s): "CHOL", "HDL", "LDLCALC", "TRIG", "CHOLHDL", "LDLDIRECT" in the last 72 hours. Thyroid Function Tests: No results for input(s): "TSH", "T4TOTAL", "FREET4", "T3FREE", "THYROIDAB" in the last 72 hours. Anemia Panel: Recent Labs    01/10/22 0514  VITAMINB12 905   Sepsis Labs: Recent Labs  Lab 01/04/22 0213 01/07/22 1031 01/08/22 0531  PROCALCITON  --   --  7.24  LATICACIDVEN 1.7 1.5  --     Recent Results (from the past 240 hour(s))  Resp panel by RT-PCR (RSV, Flu A&B, Covid) Anterior Nasal Swab     Status: Abnormal   Collection Time: 01/04/22  1:46 AM   Specimen: Anterior Nasal Swab  Result Value Ref Range Status   SARS Coronavirus 2 by RT PCR NEGATIVE NEGATIVE Final    Comment: (NOTE) SARS-CoV-2 target nucleic acids are NOT DETECTED.  The SARS-CoV-2 RNA is generally  detectable in upper respiratory specimens during the acute phase of infection. The lowest concentration of SARS-CoV-2 viral copies this assay can detect is 138 copies/mL. A negative result does not preclude SARS-Cov-2 infection and should not be used as the sole basis for treatment or other patient management decisions. A negative result may occur with  improper specimen collection/handling, submission of specimen other than nasopharyngeal swab, presence of viral mutation(s) within the areas targeted by this assay, and inadequate number of viral copies(<138 copies/mL). A negative result must be combined with clinical observations, patient history, and epidemiological information. The expected result is Negative.  Fact Sheet for Patients:  EntrepreneurPulse.com.au  Fact Sheet for Healthcare Providers:  IncredibleEmployment.be  This test is no t yet approved or cleared by the [place] FDA and  has been authorized for detection and/or diagnosis of SARS-CoV-2 by FDA under an Emergency Use Authorization (EUA). This EUA will remain  in effect (meaning this test can be used) for the duration of the COVID-19 declaration under Section 564(b)(1) of the Act, 21 U.S.C.section 360bbb-3(b)(1), unless the authorization is terminated  or revoked sooner.  Influenza A by PCR NEGATIVE NEGATIVE Final   Influenza B by PCR POSITIVE (A) NEGATIVE Final    Comment: (NOTE) The Xpert Xpress SARS-CoV-2/FLU/RSV plus assay is intended as an aid in the diagnosis of influenza from Nasopharyngeal swab specimens and should not be used as a sole basis for treatment. Nasal washings and aspirates are unacceptable for Xpert Xpress SARS-CoV-2/FLU/RSV testing.  Fact Sheet for Patients: EntrepreneurPulse.com.au  Fact Sheet for Healthcare Providers: IncredibleEmployment.be  This test is not yet approved or cleared by the [place]  FDA and has been authorized for detection and/or diagnosis of SARS-CoV-2 by FDA under an Emergency Use Authorization (EUA). This EUA will remain in effect (meaning this test can be used) for the duration of the COVID-19 declaration under Section 564(b)(1) of the Act, 21 U.S.C. section 360bbb-3(b)(1), unless the authorization is terminated or revoked.     Resp Syncytial Virus by PCR NEGATIVE NEGATIVE Final    Comment: (NOTE) Fact Sheet for Patients: EntrepreneurPulse.com.au  Fact Sheet for Healthcare Providers: IncredibleEmployment.be  This test is not yet approved or cleared by the [place] FDA and has been authorized for detection and/or diagnosis of SARS-CoV-2 by FDA under an Emergency Use Authorization (EUA). This EUA will remain in effect (meaning this test can be used) for the duration of the COVID-19 declaration under Section 564(b)(1) of the Act, 21 U.S.C. section 360bbb-3(b)(1), unless the authorization is terminated or revoked.  Performed at [facility], [address] [place], [place] [address]   Culture, blood (Routine x 2)     Status: None   Collection Time: 01/04/22  1:52 AM   Specimen: BLOOD  Result Value Ref Range Status   Specimen Description   Final    BLOOD LEFT ANTECUBITAL Performed at [facility], [place] [address] [place], [place] [address]    Special Requests   Final    BOTTLES DRAWN AEROBIC AND ANAEROBIC Blood Culture adequate volume Performed at [facility], [address] [place], [place] [address]    Culture   Final    NO GROWTH 5 DAYS Performed at [facility], [place] [address], [place], [place] [address]    Report Status 01/09/2022 FINAL  Final  Culture, blood (Routine x 2)     Status: None   Collection Time: 01/04/22  1:58 AM   Specimen: BLOOD  Result Value Ref Range Status   Specimen Description   Final    BLOOD RIGHT ANTECUBITAL Performed at [place], [place] [address] [place], [place] [address]    Special Requests   Final    BOTTLES DRAWN AEROBIC AND ANAEROBIC Blood Culture adequate volume Performed at [facility], [address] [place], [place] [address]    Culture   Final    NO GROWTH 5 DAYS Performed at [facility], [place] [address], [place], [place] [address]    Report Status 01/09/2022 FINAL  Final  Culture, blood (Routine x 2)     Status: None (Preliminary result)   Collection Time: 01/07/22 10:33 AM   Specimen: BLOOD RIGHT FOREARM  Result Value Ref Range Status   Specimen Description   Final    BLOOD RIGHT FOREARM BLOOD Performed at [facility], [address] [place], [place] [address]    Special Requests   Final    Blood Culture results may not be optimal due to an inadequate volume of blood received in  culture bottles BOTTLES DRAWN AEROBIC AND ANAEROBIC Performed at [facility], [address] [place], [place] [address]    Culture   Final    NO GROWTH 3 DAYS Performed at [facility], [place] [address], [place], [place] [address]    Report Status PENDING  Incomplete  Culture, blood (Routine x 2)     Status: Abnormal   Collection Time: 01/07/22 10:33 AM   Specimen: BLOOD LEFT FOREARM  Result Value Ref Range Status   Specimen Description   Final    BLOOD LEFT FOREARM Performed at [facility], [place] [address], [place], [place] [address]    Special Requests   Final    Blood Culture results may not be optimal due to an inadequate volume of blood received in culture bottles BOTTLES DRAWN AEROBIC AND ANAEROBIC Performed at [facility], [address] [place], [place] [address]    Culture  Setup Time   Final    GRAM POSITIVE COCCI ANAEROBIC BOTTLE ONLY CRITICAL RESULT CALLED TO, READ BACK BY AND VERIFIED WITH: Otila Back [id number] AT 9924 BY CM Performed at [facility], [place] [address] [place], [place] [address]    Culture STREPTOCOCCUS  PNEUMONIAE (A)  Final   Report Status 01/10/2022 FINAL  Final   Organism ID, Bacteria STREPTOCOCCUS PNEUMONIAE  Final      Susceptibility   Streptococcus pneumoniae - MIC*    ERYTHROMYCIN <=0.12 SENSITIVE Sensitive     LEVOFLOXACIN 1 SENSITIVE Sensitive     VANCOMYCIN 0.5 SENSITIVE Sensitive     PENICILLIN (meningitis) <=0.06 SENSITIVE Sensitive     PENO - penicillin <=0.06      PENICILLIN (non-meningitis) <=0.06 SENSITIVE Sensitive     PENICILLIN (oral) <=0.06 SENSITIVE Sensitive     CEFTRIAXONE (non-meningitis) <=0.12 SENSITIVE Sensitive     CEFTRIAXONE (meningitis) <=0.12 SENSITIVE Sensitive     * STREPTOCOCCUS PNEUMONIAE  Blood Culture ID Panel (Reflexed)     Status: Abnormal   Collection Time: 01/07/22 10:33 AM  Result Value Ref Range Status   Enterococcus faecalis NOT DETECTED NOT DETECTED Final   Enterococcus Faecium NOT DETECTED NOT DETECTED Final   Listeria monocytogenes NOT DETECTED NOT DETECTED Final   Staphylococcus species NOT DETECTED NOT DETECTED Final   Staphylococcus aureus (BCID) NOT DETECTED NOT DETECTED Final   Staphylococcus epidermidis NOT DETECTED NOT DETECTED Final   Staphylococcus lugdunensis NOT DETECTED NOT DETECTED Final   Streptococcus species DETECTED (A) NOT DETECTED Final    Comment: CRITICAL RESULT CALLED TO, READ BACK BY AND VERIFIED WITH: PHARMD J LEGGE [id number] AT 1302 BY CM    Streptococcus agalactiae NOT DETECTED NOT DETECTED Final   Streptococcus pneumoniae DETECTED (A) NOT DETECTED Final    Comment: CRITICAL RESULT CALLED TO, READ BACK BY AND VERIFIED WITH: PHARMD J LEGGE [id number] AT 1302 BY CM    Streptococcus pyogenes NOT DETECTED NOT DETECTED Final   A.calcoaceticus-baumannii NOT DETECTED NOT DETECTED Final   Bacteroides fragilis NOT DETECTED NOT DETECTED Final   Enterobacterales NOT DETECTED NOT DETECTED Final   Enterobacter cloacae complex NOT DETECTED NOT DETECTED Final   Escherichia coli NOT DETECTED NOT DETECTED Final   Klebsiella  aerogenes NOT DETECTED NOT DETECTED Final   Klebsiella oxytoca NOT DETECTED NOT DETECTED Final   Klebsiella pneumoniae NOT DETECTED NOT DETECTED Final   Proteus species NOT DETECTED NOT DETECTED Final   Salmonella species NOT DETECTED NOT DETECTED Final   Serratia marcescens NOT  DETECTED NOT DETECTED Final   Haemophilus influenzae NOT DETECTED NOT DETECTED Final   Neisseria meningitidis NOT DETECTED NOT DETECTED Final   Pseudomonas aeruginosa NOT DETECTED NOT DETECTED Final   Stenotrophomonas maltophilia NOT DETECTED NOT DETECTED Final   Candida albicans NOT DETECTED NOT DETECTED Final   Candida auris NOT DETECTED NOT DETECTED Final   Candida glabrata NOT DETECTED NOT DETECTED Final   Candida krusei NOT DETECTED NOT DETECTED Final   Candida parapsilosis NOT DETECTED NOT DETECTED Final   Candida tropicalis NOT DETECTED NOT DETECTED Final   Cryptococcus neoformans/gattii NOT DETECTED NOT DETECTED Final    Comment: Performed at [facility], [place] [address], [place], [place] [address]  Culture, blood (Routine X 2) w Reflex to ID Panel     Status: None (Preliminary result)   Collection Time: 01/09/22  1:18 PM   Specimen: BLOOD RIGHT ARM  Result Value Ref Range Status   Specimen Description   Final    BLOOD RIGHT ARM Performed at [place] [address], [place], [place] [address]    Special Requests   Final    BOTTLES DRAWN AEROBIC AND ANAEROBIC Blood Culture adequate volume Performed at [place] [address] [place], [place] [address]    Culture   Final    NO GROWTH < 24 HOURS Performed at [place] [address] [place], [place] [address]    Report Status PENDING  Incomplete  Culture, blood (Routine X 2) w Reflex to ID Panel     Status: None (Preliminary result)   Collection Time: 01/09/22  1:29 PM   Specimen: BLOOD RIGHT HAND  Result Value Ref Range Status   Specimen Description   Final    BLOOD RIGHT  HAND Performed at [place] [address], [place], [place] [address]    Special Requests   Final    BOTTLES DRAWN AEROBIC AND ANAEROBIC Blood Culture adequate volume Performed at [place] [address], [place], [place] [address]    Culture   Final    NO GROWTH < 24 HOURS Performed at [place] [address], [place], [place] [address]    Report Status PENDING  Incomplete  Expectorated Sputum Assessment w Gram Stain, Rflx to Resp Cult     Status: None   Collection Time: 01/10/22 12:33 AM   Specimen: Expectorated Sputum  Result Value Ref Range Status   Specimen Description EXPECTORATED SPUTUM  Final   Special Requests NONE  Final   Sputum evaluation   Final    THIS SPECIMEN IS ACCEPTABLE FOR SPUTUM CULTURE Performed at [facility], [place] [address] [place], [place] [address]    Report Status 01/10/2022 FINAL  Final  Culture, Respiratory w Gram Stain     Status: None (Preliminary result)   Collection Time: 01/10/22 12:33 AM  Result Value Ref Range Status   Specimen Description   Final    EXPECTORATED SPUTUM Performed at [place] [address] [place], [place] [address]    Special Requests   Final    NONE Reflexed from [id number] Performed at [facility], [place] [address], [place], [place] [address]    Gram Stain   Final    FEW WBC PRESENT, PREDOMINANTLY MONONUCLEAR FEW GRAM POSITIVE RODS RARE GRAM NEGATIVE RODS RARE YEAST WITH PSEUDOHYPHAE Performed at [facility], [place] [address], [place],  [address]  Culture PENDING  Incomplete   Report Status PENDING  Incomplete         Radiology Studies: No results found.      Scheduled Meds:  benzonatate  100 mg Oral TID   enoxaparin (LOVENOX) injection  40 mg Subcutaneous QHS   guaiFENesin  600 mg Oral BID   multivitamin with minerals  1 tablet Oral Daily   oseltamivir  30 mg Oral BID    sertraline  150 mg Oral QHS   sodium chloride flush  3 mL Intravenous Q12H   Continuous Infusions:  sodium chloride Stopped (01/07/22 1506)   cefTRIAXone (ROCEPHIN)  IV 2 g (01/09/22 1222)     LOS: 2 days    Time spent: 35 minutes    Barb Merino, MD [facility] Pager [phone]

## 2022-01-10 NOTE — Plan of Care (Signed)

## 2022-01-11 ENCOUNTER — Inpatient Hospital Stay (HOSPITAL_COMMUNITY): Payer: PPO

## 2022-01-11 DIAGNOSIS — J189 Pneumonia, unspecified organism: Secondary | ICD-10-CM | POA: Diagnosis not present

## 2022-01-11 LAB — CBC WITH DIFFERENTIAL/PLATELET
Abs Immature Granulocytes: 2.04 10*3/uL — ABNORMAL HIGH (ref 0.00–0.07)
Basophils Absolute: 0 10*3/uL (ref 0.0–0.1)
Basophils Relative: 0 %
Eosinophils Absolute: 0.3 10*3/uL (ref 0.0–0.5)
Eosinophils Relative: 2 %
HCT: 30 % — ABNORMAL LOW (ref 36.0–46.0)
Hemoglobin: 9.7 g/dL — ABNORMAL LOW (ref 12.0–15.0)
Immature Granulocytes: 12 %
Lymphocytes Relative: 15 %
Lymphs Abs: 2.4 10*3/uL (ref 0.7–4.0)
MCH: 29.8 pg (ref 26.0–34.0)
MCHC: 32.3 g/dL (ref 30.0–36.0)
MCV: 92.3 fL (ref 80.0–100.0)
Monocytes Absolute: 0.7 10*3/uL (ref 0.1–1.0)
Monocytes Relative: 4 %
Neutro Abs: 11.2 10*3/uL — ABNORMAL HIGH (ref 1.7–7.7)
Neutrophils Relative %: 67 %
Platelets: 386 10*3/uL (ref 150–400)
RBC: 3.25 MIL/uL — ABNORMAL LOW (ref 3.87–5.11)
RDW: 13.9 % (ref 11.5–15.5)
WBC: 16.7 10*3/uL — ABNORMAL HIGH (ref 4.0–10.5)
nRBC: 0 % (ref 0.0–0.2)

## 2022-01-11 LAB — LEGIONELLA PNEUMOPHILA SEROGP 1 UR AG: L. pneumophila Serogp 1 Ur Ag: NEGATIVE

## 2022-01-11 NOTE — Progress Notes (Addendum)
Mobility Specialist - Progress Note  Pre-mobility: 91% SpO2 During mobility: 87% SpO2 Post-mobility: 92% SPO2   01/11/22 1416  Oxygen Therapy  O2 Device Nasal Cannula  O2 Flow Rate (L/min) 2 L/min  Patient Activity (if Appropriate) Ambulating  Mobility  Activity Ambulated independently in hallway  Level of Assistance Independent after set-up  Assistive Device None  Distance Ambulated (ft) 500 ft  Range of Motion/Exercises Active  Activity Response Tolerated well  Mobility Referral Yes  $Mobility charge 1 Mobility   Pt was found in bed and agreeable to ambulate. Ambulated to the bathroom before ambulating in hallway. She stated feeling a little SOB and had some coughing during session. At EOS returned to recliner chair with necessities in reach and RN notified of session.  Ferd Hibbs Mobility Specialist

## 2022-01-11 NOTE — Progress Notes (Addendum)
PROGRESS NOTE    Jennifer Sandoval  ZGY:[id number]  DOB: Mar 02, 1955  DOA: 01/07/2022 PCP: Mosie Lukes, MD Outpatient Specialists:   Hospital course:  67 year old female was admitted with influenza B and multifocal bacterial pneumonia.  Pneumococcal urine antigen is positive.   Subjective:  Patient thinks that she is generally getting better, notes that if she does not use her oxygen "I crash".  Her main concern today is a sore throat but otherwise feels okay.   Objective: Vitals:   01/10/22 2203 01/10/22 2312 01/11/22 0552 01/11/22 1310  BP: 133/78 128/75 116/71 (!) 108/96  Pulse: 89 88 77 (!) 102  Resp: 20 (!) '24 18 18  '$ Temp: 98.4 F (36.9 C) 99 F (37.2 C) 97.9 F (36.6 C) 98.8 F (37.1 C)  TempSrc: Oral Oral Oral Oral  SpO2: (!) 87% 95% 94% 94%  Weight:      Height:        Intake/Output Summary (Last 24 hours) at 01/11/2022 1336 Last data filed at 01/11/2022 1300 Gross per 24 hour  Intake 800 ml  Output 500 ml  Net 300 ml   Filed Weights   01/07/22 0952 01/07/22 1029  Weight: 57.2 kg 57.9 kg     Exam:  General: Patient seen sitting up in bed in NAD, no oxygen on her face with no apparent shortness of breath either at rest or with speaking. Eyes: sclera anicteric, conjuctiva mild injection bilaterally CVS: S1-S2, regular  Respiratory:  decreased air entry bilaterally, has significant rales one third of the way up on the left with egophony and some rales at right base as well. GI: NABS, soft, NT  LE: Warm and well-perfused Neuro: A/O x 3,  grossly nonfocal.  Psych: patient is logical and coherent, judgement and insight appear normal, mood and affect appropriate to situation.  Data Reviewed:  Basic Metabolic Panel: Recent Labs  Lab 01/07/22 1031 01/08/22 0531  NA 136 138  K 3.9 4.1  CL 106 107  CO2 21* 25  GLUCOSE 84 90  BUN 18 23  CREATININE 0.60 0.54  CALCIUM 8.3* 8.2*    CBC: Recent Labs  Lab 01/07/22 1031 01/08/22 0531 01/10/22 1144  01/11/22 0525  WBC 7.2 8.6 17.3* 16.7*  NEUTROABS 6.5  --  14.2* 11.2*  HGB 12.6 11.0* 10.8* 9.7*  HCT 37.9 34.2* 33.7* 30.0*  MCV 90.2 92.2 93.4 92.3  PLT 260 263 368 386     Scheduled Meds:  benzonatate  100 mg Oral TID   enoxaparin (LOVENOX) injection  40 mg Subcutaneous QHS   guaiFENesin  600 mg Oral BID   multivitamin with minerals  1 tablet Oral Daily   oseltamivir  30 mg Oral BID   sertraline  150 mg Oral QHS   sodium chloride flush  3 mL Intravenous Q12H   Continuous Infusions:  sodium chloride Stopped (01/07/22 1506)   cefTRIAXone (ROCEPHIN)  IV 2 g (01/11/22 1159)     Assessment & Plan:   Influenza B infection Multifocal CAP with positive pneumococcal urine antigen Blood cultures positive for strep pneumonia Lung exam today is concerning given what appears to be a significant consolidation at left base whereas CXR showed increased consolidation at right base on admission. Will order chest CT to better delineate--chest CT shows bilateral dense consolidations at the bases Leukocytosis is persistent although appears to have a worsened left shift today Continue ceftriaxone day #4 for pneumococcal pneumonia and bacteremia Patient was started on Tamiflu although she is not flu at  least since 01/04/2022 Can consider walking patient off oxygen to see if she can tolerate it tomorrow although chest CT is impressive for significant pneumonia, patient appears comfortable at rest off of oxygen  Anxiety and depression Continue sertraline    DVT prophylaxis: Lovenox Code Status: Full Family Communication: Patient's husband was at bedside throughout   Studies: CT CHEST WO CONTRAST  Result Date: 01/11/2022 CLINICAL DATA:  Follow-up bilateral pneumonia. EXAM: CT CHEST WITHOUT CONTRAST TECHNIQUE: Multidetector CT imaging of the chest was performed following the standard protocol without IV contrast. RADIATION DOSE REDUCTION: This exam was performed according to the  departmental dose-optimization program which includes automated exposure control, adjustment of the mA and/or kV according to patient size and/or use of iterative reconstruction technique. COMPARISON:  Portable chest dated 01/07/2022. Chest CT dated 11/01/2016. FINDINGS: Cardiovascular: Mildly enlarged heart. Mild aortic calcification. Mediastinum/Nodes: No enlarged mediastinal or axillary lymph nodes. Thyroid gland, trachea, and esophagus demonstrate no significant findings. Lungs/Pleura: Areas of dense opacification with air bronchograms in both lower lobes and right middle lobe. Lesser amounts of airspace consolidation and volume loss in the upper lobes. Multiple areas of ground-glass opacity in both upper lobes. Small bilateral pleural effusions. Upper Abdomen: Post gastric bypass changes. Cholecystectomy clips. Musculoskeletal: Thoracic and lower cervical spine degenerative changes. IMPRESSION: 1. Areas of dense opacification with air bronchograms in both lower lobes and right middle lobe. 2. Lesser amounts of airspace consolidation and volume loss in the upper lobes. 3. Multiple areas of ground-glass opacity in both upper lobes. These findings are all compatible with multifocal pneumonia. 4. Small bilateral pleural effusions. 5. Mild cardiomegaly. Aortic Atherosclerosis (ICD10-I70.0). Electronically Signed   By: Claudie Revering M.D.   On: 01/11/2022 12:04    Principal Problem:   Multifocal pneumonia Active Problems:   Depression with anxiety   Influenza B   Bronchitis with acute wheezing   Bacteremia due to Streptococcus pneumoniae     Garnet Overfield Derek Jack, [facility]  If 7PM-7AM, please contact night-coverage [email or web address]   LOS: 3 days

## 2022-01-12 DIAGNOSIS — J189 Pneumonia, unspecified organism: Secondary | ICD-10-CM | POA: Diagnosis not present

## 2022-01-12 DIAGNOSIS — B953 Streptococcus pneumoniae as the cause of diseases classified elsewhere: Secondary | ICD-10-CM

## 2022-01-12 DIAGNOSIS — J40 Bronchitis, not specified as acute or chronic: Secondary | ICD-10-CM | POA: Diagnosis not present

## 2022-01-12 DIAGNOSIS — F418 Other specified anxiety disorders: Secondary | ICD-10-CM | POA: Diagnosis not present

## 2022-01-12 DIAGNOSIS — J101 Influenza due to other identified influenza virus with other respiratory manifestations: Secondary | ICD-10-CM

## 2022-01-12 DIAGNOSIS — R7881 Bacteremia: Secondary | ICD-10-CM | POA: Diagnosis not present

## 2022-01-12 LAB — CBC WITH DIFFERENTIAL/PLATELET
Abs Immature Granulocytes: 1.37 10*3/uL — ABNORMAL HIGH (ref 0.00–0.07)
Basophils Absolute: 0.1 10*3/uL (ref 0.0–0.1)
Basophils Relative: 1 %
Eosinophils Absolute: 0.2 10*3/uL (ref 0.0–0.5)
Eosinophils Relative: 2 %
HCT: 30.1 % — ABNORMAL LOW (ref 36.0–46.0)
Hemoglobin: 9.6 g/dL — ABNORMAL LOW (ref 12.0–15.0)
Immature Granulocytes: 10 %
Lymphocytes Relative: 13 %
Lymphs Abs: 1.8 10*3/uL (ref 0.7–4.0)
MCH: 29.5 pg (ref 26.0–34.0)
MCHC: 31.9 g/dL (ref 30.0–36.0)
MCV: 92.6 fL (ref 80.0–100.0)
Monocytes Absolute: 0.5 10*3/uL (ref 0.1–1.0)
Monocytes Relative: 4 %
Neutro Abs: 9.7 10*3/uL — ABNORMAL HIGH (ref 1.7–7.7)
Neutrophils Relative %: 70 %
Platelets: 387 10*3/uL (ref 150–400)
RBC: 3.25 MIL/uL — ABNORMAL LOW (ref 3.87–5.11)
RDW: 13.8 % (ref 11.5–15.5)
WBC: 13.8 10*3/uL — ABNORMAL HIGH (ref 4.0–10.5)
nRBC: 0 % (ref 0.0–0.2)

## 2022-01-12 LAB — CULTURE, BLOOD (ROUTINE X 2): Culture: NO GROWTH

## 2022-01-12 LAB — BASIC METABOLIC PANEL
Anion gap: 6 (ref 5–15)
BUN: 11 mg/dL (ref 8–23)
CO2: 30 mmol/L (ref 22–32)
Calcium: 7.9 mg/dL — ABNORMAL LOW (ref 8.9–10.3)
Chloride: 102 mmol/L (ref 98–111)
Creatinine, Ser: 0.52 mg/dL (ref 0.44–1.00)
GFR, Estimated: >60 mL/min (ref >60)
Glucose, Bld: 100 mg/dL — ABNORMAL HIGH (ref 70–99)
Potassium: 3.5 mmol/L (ref 3.5–5.1)
Sodium: 138 mmol/L (ref 135–145)

## 2022-01-12 LAB — CULTURE, RESPIRATORY W GRAM STAIN: Culture: NORMAL

## 2022-01-12 LAB — MISC LABCORP TEST (SEND OUT): Labcorp test code: 81950

## 2022-01-12 NOTE — Care Management Important Message (Signed)
Important Message  Patient Details  Name: Jennifer Sandoval MRN: [id number] Date of Birth: 1955/03/23   Medicare Important Message Given:  Yes     Memory Argue 01/12/2022, 11:36 AM

## 2022-01-12 NOTE — Progress Notes (Signed)
PROGRESS NOTE    Jennifer Sandoval  [id number]:[id number] DOB: 1955/02/29 DOA: 01/07/2022 PCP: Mosie Lukes, MD    Brief Narrative:   Jennifer Sandoval is a 67 y.o. female with past medical history of depression presented to the hospital with shortness of breath.  She does have a recurrent sinus infection and shortly after Christmas was on Augmentin which she completed but again started having subjective fever, chills diaphoresis with cough, body aches and fatigue.  She was then seen in the ED 01/04/2022 for shortness of breath and tested positive for influenza A.  Chest x-ray showed bibasilar atelectasis.  She was given steroids, nebulizers and was discharged home on course of prednisone and albuterol.  She did have some improvement in her symptoms but had persistent dyspnea and pleuritic chest pain show she again presented to the hospital.  On this presentation, patient was tachycardic, febrile with a temperature of 100.7 F and tachypneic and was put on 2 L of oxygen by nasal cannula.  Initial labs showed no leukocytosis with a lactate of 1.5.  Urinalysis was negative.  Blood cultures were sent.  Chest x-ray showed bilateral airspace opacities density at the right base and left midlung.  Patient received Ringer lactate, IV Rocephin, Zithromax and was admitted hospital for further evaluation and treatment.   Assessment and plan.  Influenza B with multifocal pneumonia, streptococcal pneumonia: Diagnosed with influenza on 01/04/22.  Presented with dyspnea with new airspace opacities.  Continue Rocephin, has been started on Tamiflu.  Continue  incentive spirometry, flutter valve albuterol.  Initial blood cultures showing strep pneumonia.  ID was consulted.  Repeat blood culture has been sent.  If positive consider 2D echo.  Procalcitonin was 7.2.  Strep urinary antigen was positive.  Legionella antigen was negative. Patient has leukocytosis at 13.8 but has trended down.  Temperature max of 99.7 F.  On Nasal cannula oxygen.   Will wean as able.  Streptococcal bacteremia. Continue IV Rocephin.  Depression/anxiety: Continue sertraline.     DVT prophylaxis: enoxaparin (LOVENOX) injection 40 mg Start: 01/07/22 2200   Code Status:     Code Status: Full Code  Disposition: Likely home in 1 to 2 days.  Status is: Inpatient  Remains inpatient appropriate because: IV antibiotic, streptococcal bacteremia, multifocal pneumonia,   Family Communication: None at bedside  Consultants:  None, verbal consult with ID  Procedures:  None  Antimicrobials:  Rocephin IV  Anti-infectives (From admission, onward)    Start     Dose/Rate Route Frequency Ordered Stop   01/09/22 1200  cefTRIAXone (ROCEPHIN) 2 g in sodium chloride 0.9 % 100 mL IVPB        2 g 200 mL/hr over 30 Minutes Intravenous Every 24 hours 01/09/22 0751     01/08/22 1300  azithromycin (ZITHROMAX) 500 mg in sodium chloride 0.9 % 250 mL IVPB  Status:  Discontinued        500 mg 250 mL/hr over 60 Minutes Intravenous Every 24 hours 01/07/22 1911 01/10/22 1102   01/08/22 1200  cefTRIAXone (ROCEPHIN) 2 g in sodium chloride 0.9 % 100 mL IVPB  Status:  Discontinued        2 g 200 mL/hr over 30 Minutes Intravenous Every 24 hours 01/07/22 1911 01/09/22 0751   01/07/22 2200  oseltamivir (TAMIFLU) capsule 30 mg        30 mg Oral 2 times daily 01/07/22 1911 01/12/22 0841   01/07/22 1130  cefTRIAXone (ROCEPHIN) 1 g in sodium chloride 0.9 % 100 mL IVPB  1 g 200 mL/hr over 30 Minutes Intravenous  Once 01/07/22 1122 01/07/22 1229   01/07/22 1130  azithromycin (ZITHROMAX) 500 mg in sodium chloride 0.9 % 250 mL IVPB        500 mg 250 mL/hr over 60 Minutes Intravenous  Once 01/07/22 1122 01/07/22 1408      Subjective: Today, patient was seen and examined at bedside.  Patient is still complains of cough, shortness of breath has mild sputum production.  Feels little better than yesterday.  No chest pain or fever or chills.  Has occasional yellow  sputum  Objective: Vitals:   01/11/22 0552 01/11/22 1310 01/11/22 1933 01/12/22 0529  BP: 116/71 (!) 108/96 117/69 107/67  Pulse: 77 (!) 102 92 73  Resp: '18 18 20 12  '$ Temp: 97.9 F (36.6 C) 98.8 F (37.1 C) 99.7 F (37.6 C) 98.8 F (37.1 C)  TempSrc: Oral Oral Oral Oral  SpO2: 94% 94% 95% 97%  Weight:      Height:        Intake/Output Summary (Last 24 hours) at 01/12/2022 1109 Last data filed at 01/12/2022 0842 Gross per 24 hour  Intake 1040 ml  Output 500 ml  Net 540 ml   Filed Weights   01/07/22 0952 01/07/22 1029  Weight: 57.2 kg 57.9 kg    Physical Examination: Body mass index is 23.35 kg/m.  General:  Average built, not in obvious distress, on nasal cannula oxygen, HENT:   No scleral pallor or icterus noted. Oral mucosa is moist.  Chest:  Diminished breath sounds bilaterally.  Coarse breath sounds noted CVS: S1 &S2 heard. No murmur.  Regular rate and rhythm. Abdomen: Soft, nontender, nondistended.  Bowel sounds are heard.   Extremities: No cyanosis, clubbing or edema.  Peripheral pulses are palpable. Psych: Alert, awake and oriented, normal mood CNS:  No cranial nerve deficits.  Power equal in all extremities.   Skin: Warm and dry.  No rashes noted.  Data Reviewed:   CBC: Recent Labs  Lab 01/07/22 1031 01/08/22 0531 01/10/22 1144 01/11/22 0525 01/12/22 0455  WBC 7.2 8.6 17.3* 16.7* 13.8*  NEUTROABS 6.5  --  14.2* 11.2* 9.7*  HGB 12.6 11.0* 10.8* 9.7* 9.6*  HCT 37.9 34.2* 33.7* 30.0* 30.1*  MCV 90.2 92.2 93.4 92.3 92.6  PLT 260 263 368 386 629    Basic Metabolic Panel: Recent Labs  Lab 01/07/22 1031 01/08/22 0531 01/12/22 0455  NA 136 138 138  K 3.9 4.1 3.5  CL 106 107 102  CO2 21* 25 30  GLUCOSE 84 90 100*  BUN '18 23 11  '$ CREATININE 0.60 0.54 0.52  CALCIUM 8.3* 8.2* 7.9*    Liver Function Tests: Recent Labs  Lab 01/07/22 1031  AST 35  ALT 27  ALKPHOS 69  BILITOT 1.1  PROT 6.2*  ALBUMIN 2.6*     Radiology Studies: CT CHEST  WO CONTRAST  Result Date: 01/11/2022 CLINICAL DATA:  Follow-up bilateral pneumonia. EXAM: CT CHEST WITHOUT CONTRAST TECHNIQUE: Multidetector CT imaging of the chest was performed following the standard protocol without IV contrast. RADIATION DOSE REDUCTION: This exam was performed according to the departmental dose-optimization program which includes automated exposure control, adjustment of the mA and/or kV according to patient size and/or use of iterative reconstruction technique. COMPARISON:  Portable chest dated 01/07/2022. Chest CT dated 11/01/2016. FINDINGS: Cardiovascular: Mildly enlarged heart. Mild aortic calcification. Mediastinum/Nodes: No enlarged mediastinal or axillary lymph nodes. Thyroid gland, trachea, and esophagus demonstrate no significant findings. Lungs/Pleura: Areas of dense  opacification with air bronchograms in both lower lobes and right middle lobe. Lesser amounts of airspace consolidation and volume loss in the upper lobes. Multiple areas of ground-glass opacity in both upper lobes. Small bilateral pleural effusions. Upper Abdomen: Post gastric bypass changes. Cholecystectomy clips. Musculoskeletal: Thoracic and lower cervical spine degenerative changes. IMPRESSION: 1. Areas of dense opacification with air bronchograms in both lower lobes and right middle lobe. 2. Lesser amounts of airspace consolidation and volume loss in the upper lobes. 3. Multiple areas of ground-glass opacity in both upper lobes. These findings are all compatible with multifocal pneumonia. 4. Small bilateral pleural effusions. 5. Mild cardiomegaly. Aortic Atherosclerosis (ICD10-I70.0). Electronically Signed   By: Claudie Revering M.D.   On: 01/11/2022 12:04      LOS: 4 days    Flora Lipps, MD [facility] Available via Epic secure chat 7am-7pm After these hours, please refer to coverage provider listed on [email or web address] 01/12/2022, 11:09 AM

## 2022-01-12 NOTE — Progress Notes (Signed)
Mobility Specialist - Progress Note  Nurse requested Mobility Specialist to perform oxygen saturation test with pt which includes removing pt from oxygen both at rest and while ambulating.  Below are the results from that testing.     Patient Saturations on Room Air at Rest = spO2 90%  Patient Saturations on Room Air while Ambulating = sp02 85% .  Rested and performed pursed lip breathing for 1 minute with sp02 at 83%.  Patient Saturations on 2 Liters of oxygen while Ambulating = sp02 94%  At end of testing pt left in room on 2  Liters of oxygen.  Reported results to nurse.     01/12/22 1430  Mobility  Activity Ambulated independently in hallway  Level of Assistance Modified independent, requires aide device or extra time  Assistive Device None  Distance Ambulated (ft) 500 ft  Range of Motion/Exercises Active  Activity Response Tolerated well  Mobility Referral Yes  $Mobility charge 1 Mobility   Pt was found on recliner chair and agreeable to try ambulation. Had no complaints and at EOS returned to recliner chair with all necessities in reach.  Ferd Hibbs Mobility Specialist

## 2022-01-13 DIAGNOSIS — R7881 Bacteremia: Secondary | ICD-10-CM | POA: Diagnosis not present

## 2022-01-13 DIAGNOSIS — F418 Other specified anxiety disorders: Secondary | ICD-10-CM | POA: Diagnosis not present

## 2022-01-13 DIAGNOSIS — J189 Pneumonia, unspecified organism: Secondary | ICD-10-CM | POA: Diagnosis not present

## 2022-01-13 DIAGNOSIS — J40 Bronchitis, not specified as acute or chronic: Secondary | ICD-10-CM | POA: Diagnosis not present

## 2022-01-13 LAB — MAGNESIUM: Magnesium: 2.2 mg/dL (ref 1.7–2.4)

## 2022-01-13 LAB — BASIC METABOLIC PANEL
Anion gap: 9 (ref 5–15)
BUN: 10 mg/dL (ref 8–23)
CO2: 30 mmol/L (ref 22–32)
Calcium: 8.4 mg/dL — ABNORMAL LOW (ref 8.9–10.3)
Chloride: 101 mmol/L (ref 98–111)
Creatinine, Ser: 0.5 mg/dL (ref 0.44–1.00)
GFR, Estimated: >60 mL/min (ref >60)
Glucose, Bld: 90 mg/dL (ref 70–99)
Potassium: 4.1 mmol/L (ref 3.5–5.1)
Sodium: 140 mmol/L (ref 135–145)

## 2022-01-13 LAB — CBC
HCT: 33.4 % — ABNORMAL LOW (ref 36.0–46.0)
Hemoglobin: 10.6 g/dL — ABNORMAL LOW (ref 12.0–15.0)
MCH: 29.5 pg (ref 26.0–34.0)
MCHC: 31.7 g/dL (ref 30.0–36.0)
MCV: 93 fL (ref 80.0–100.0)
Platelets: 526 10*3/uL — ABNORMAL HIGH (ref 150–400)
RBC: 3.59 MIL/uL — ABNORMAL LOW (ref 3.87–5.11)
RDW: 13.7 % (ref 11.5–15.5)
WBC: 16.7 10*3/uL — ABNORMAL HIGH (ref 4.0–10.5)
nRBC: 0 % (ref 0.0–0.2)

## 2022-01-13 MED ORDER — DIPHENHYDRAMINE HCL 25 MG PO CAPS
25.0000 mg | ORAL_CAPSULE | Freq: Four times a day (QID) | ORAL | Status: DC | PRN
  Administered 2022-01-13: 25 mg via ORAL
  Filled 2022-01-13: qty 1

## 2022-01-13 MED ORDER — METHYLPREDNISOLONE SODIUM SUCC 40 MG IJ SOLR
40.0000 mg | Freq: Two times a day (BID) | INTRAMUSCULAR | Status: DC
  Administered 2022-01-13 – 2022-01-14 (×3): 40 mg via INTRAVENOUS
  Filled 2022-01-13 (×3): qty 1

## 2022-01-13 NOTE — Progress Notes (Addendum)
PROGRESS NOTE    [name]  [id number]:[id number] DOB: [birth year] DOA: 01/07/2022 PCP: Mosie Lukes, MD    Brief Narrative:   Jennifer Sandoval is a 67 y.o. female with past medical history of depression presented to the hospital with shortness of breath.  She does have a recurrent sinus infection and shortly after Christmas was on Augmentin which she completed but again started having subjective fever, chills diaphoresis with cough, body aches and fatigue.  She was then seen in the ED 01/04/2022 for shortness of breath and tested positive for influenza A.  Chest x-ray showed bibasilar atelectasis.  She was given steroids, nebulizers and was discharged home on course of prednisone and albuterol.  She did have some improvement in her symptoms but had persistent dyspnea and pleuritic chest pain show she again presented to the hospital.  On this presentation, patient was tachycardic, febrile with a temperature of 100.7 F and tachypneic and was put on 2 L of oxygen by nasal cannula.  Initial labs showed no leukocytosis with a lactate of 1.5.  Urinalysis was negative.  Blood cultures were sent.  Chest x-ray showed bilateral airspace opacities density at the right base and left midlung.  Patient received Ringer lactate, IV Rocephin, Zithromax and was admitted hospital for further evaluation and treatment.   Assessment and plan.  Influenza B with multifocal pneumonia, streptococcal pneumonia: Diagnosed with influenza on 01/04/22.  Presented with dyspnea with new airspace opacities.  Continue Rocephin, has been started on Tamiflu.  Continue  incentive spirometry, flutter valve albuterol.  Initial blood cultures showing strep pneumonia.  ID was consulted.  Repeat blood culture has been sent.  If positive consider 2D echo.  Procalcitonin was 7.2.  Strep urinary antigen was positive.  Legionella antigen was negative. Patient has leukocytosis which is trended up to 16.7 from at 13.8  Temperature max of 99.3 F.  On Nasal  cannula oxygen.  Will wean as able.  Will add Solu-Medrol every 12 for now  Streptococcal bacteremia. Continue IV Rocephin.  Depression/anxiety: Continue sertraline.     DVT prophylaxis: enoxaparin (LOVENOX) injection 40 mg Start: 01/07/22 2200   Code Status:     Code Status: Full Code  Disposition: Likely home on 01/14/2022 if able to wean oxygen and counts improving.  Might need oxygen if unable to wean.  Status is: Inpatient  Remains inpatient appropriate because: IV antibiotic, streptococcal bacteremia, multifocal pneumonia, rising leukocytosis.   Family Communication: None at bedside  Consultants:  None, verbal consult with ID  Procedures:  None  Antimicrobials:  Rocephin IV  Anti-infectives (From admission, onward)    Start     Dose/Rate Route Frequency Ordered Stop   01/09/22 1200  cefTRIAXone (ROCEPHIN) 2 g in sodium chloride 0.9 % 100 mL IVPB        2 g 200 mL/hr over 30 Minutes Intravenous Every 24 hours 01/09/22 0751     01/08/22 1300  azithromycin (ZITHROMAX) 500 mg in sodium chloride 0.9 % 250 mL IVPB  Status:  Discontinued        500 mg 250 mL/hr over 60 Minutes Intravenous Every 24 hours 01/07/22 1911 01/10/22 1102   01/08/22 1200  cefTRIAXone (ROCEPHIN) 2 g in sodium chloride 0.9 % 100 mL IVPB  Status:  Discontinued        2 g 200 mL/hr over 30 Minutes Intravenous Every 24 hours 01/07/22 1911 01/09/22 0751   01/07/22 2200  oseltamivir (TAMIFLU) capsule 30 mg        30  mg Oral 2 times daily 01/07/22 1911 01/12/22 0841   01/07/22 1130  cefTRIAXone (ROCEPHIN) 1 g in sodium chloride 0.9 % 100 mL IVPB        1 g 200 mL/hr over 30 Minutes Intravenous  Once 01/07/22 1122 01/07/22 1229   01/07/22 1130  azithromycin (ZITHROMAX) 500 mg in sodium chloride 0.9 % 250 mL IVPB        500 mg 250 mL/hr over 60 Minutes Intravenous  Once 01/07/22 1122 01/07/22 1408      Subjective: Today, patient was seen and examined at bedside.  Still has some cough with phlegm  production.  She was hypoxic on ambulation in the 80s but okay at rest.  Denies any fever chills chest pain. Objective: Vitals:   01/12/22 1445 01/12/22 1454 01/12/22 2200 01/13/22 0424  BP: (!) 140/77 118/73 117/76 126/74  Pulse: (!) 122 77 87 89  Resp: '16 18 19 20  '$ Temp: 98 F (36.7 C) 98.6 F (37 C) 98.3 F (36.8 C) 99.3 F (37.4 C)  TempSrc: Oral Oral Oral Oral  SpO2: 100% 95% 91% 95%  Weight:      Height:        Intake/Output Summary (Last 24 hours) at 01/13/2022 1428 Last data filed at 01/13/2022 1030 Gross per 24 hour  Intake 360 ml  Output --  Net 360 ml    Filed Weights   01/07/22 0952 01/07/22 1029  Weight: 57.2 kg 57.9 kg    Physical Examination: Body mass index is 23.35 kg/m.   General: Alert awake Communicative, not in obvious distress on nasal cannula oxygen HENT:   No scleral pallor or icterus noted. Oral mucosa is moist.  Chest: Diminished breath sounds bilaterally, coarse breath sounds noted.   CVS: S1 &S2 heard. No murmur.  Regular rate and rhythm. Abdomen: Soft, nontender, nondistended.  Bowel sounds are heard.   Extremities: No cyanosis, clubbing or edema.  Peripheral pulses are palpable. Psych: Alert, awake and oriented, normal mood CNS:  No cranial nerve deficits.  Power equal in all extremities.   Skin: Warm and dry.  No rashes noted.  Data Reviewed:   CBC: Recent Labs  Lab 01/07/22 1031 01/08/22 0531 01/10/22 1144 01/11/22 0525 01/12/22 0455 01/13/22 0711  WBC 7.2 8.6 17.3* 16.7* 13.8* 16.7*  NEUTROABS 6.5  --  14.2* 11.2* 9.7*  --   HGB 12.6 11.0* 10.8* 9.7* 9.6* 10.6*  HCT 37.9 34.2* 33.7* 30.0* 30.1* 33.4*  MCV 90.2 92.2 93.4 92.3 92.6 93.0  PLT 260 263 368 386 387 526*     Basic Metabolic Panel: Recent Labs  Lab 01/07/22 1031 01/08/22 0531 01/12/22 0455 01/13/22 0711  NA 136 138 138 140  K 3.9 4.1 3.5 4.1  CL 106 107 102 101  CO2 21* '25 30 30  '$ GLUCOSE 84 90 100* 90  BUN '18 23 11 10  '$ CREATININE 0.60 0.54 0.52 0.50   CALCIUM 8.3* 8.2* 7.9* 8.4*  MG  --   --   --  2.2     Liver Function Tests: Recent Labs  Lab 01/07/22 1031  AST 35  ALT 27  ALKPHOS 69  BILITOT 1.1  PROT 6.2*  ALBUMIN 2.6*      Radiology Studies: No results found.    LOS: 5 days    Flora Lipps, MD [facility] Available via Epic secure chat 7am-7pm After these hours, please refer to coverage provider listed on [email or web address] 01/13/2022, 2:28 PM

## 2022-01-13 NOTE — Progress Notes (Signed)
Mobility Specialist - Progress Note  Pre-mobility: 90% SpO2 During mobility: 92% SpO2 Post-mobility: 94% SPO2   01/13/22 0913  Oxygen Therapy  O2 Device Nasal Cannula  O2 Flow Rate (L/min) 2 L/min  Patient Activity (if Appropriate) Ambulating  Mobility  Activity Ambulated independently in hallway  Level of Assistance Independent  Assistive Device None  Distance Ambulated (ft) 500 ft  Range of Motion/Exercises Active  Activity Response Tolerated well  Mobility Referral Yes  $Mobility charge 1 Mobility   Pt was found on RA and stated wanting to go to the BR. Afterwards SPO2 was checked and was 85%. After O2 was placed she was able to bring SPO2 >90% within seconds. Had no complaints and at EOS returned to recliner chair with necessities in reach.  Ferd Hibbs Mobility Specialist

## 2022-01-13 NOTE — Plan of Care (Signed)
  Problem: Activity: Goal: Risk for activity intolerance will decrease Outcome: Adequate for Discharge   Problem: Nutrition: Goal: Adequate nutrition will be maintained Outcome: Adequate for Discharge   Problem: Coping: Goal: Level of anxiety will decrease Outcome: Adequate for Discharge

## 2022-01-14 DIAGNOSIS — R7881 Bacteremia: Secondary | ICD-10-CM | POA: Diagnosis not present

## 2022-01-14 DIAGNOSIS — J189 Pneumonia, unspecified organism: Secondary | ICD-10-CM | POA: Diagnosis not present

## 2022-01-14 DIAGNOSIS — F418 Other specified anxiety disorders: Secondary | ICD-10-CM | POA: Diagnosis not present

## 2022-01-14 DIAGNOSIS — J40 Bronchitis, not specified as acute or chronic: Secondary | ICD-10-CM | POA: Diagnosis not present

## 2022-01-14 LAB — CBC
HCT: 31.8 % — ABNORMAL LOW (ref 36.0–46.0)
Hemoglobin: 10.1 g/dL — ABNORMAL LOW (ref 12.0–15.0)
MCH: 30.1 pg (ref 26.0–34.0)
MCHC: 31.8 g/dL (ref 30.0–36.0)
MCV: 94.6 fL (ref 80.0–100.0)
Platelets: 549 10*3/uL — ABNORMAL HIGH (ref 150–400)
RBC: 3.36 MIL/uL — ABNORMAL LOW (ref 3.87–5.11)
RDW: 13.8 % (ref 11.5–15.5)
WBC: 13.2 10*3/uL — ABNORMAL HIGH (ref 4.0–10.5)
nRBC: 0 % (ref 0.0–0.2)

## 2022-01-14 LAB — CULTURE, BLOOD (ROUTINE X 2)
Culture: NO GROWTH
Culture: NO GROWTH
Special Requests: ADEQUATE
Special Requests: ADEQUATE

## 2022-01-14 MED ORDER — ADULT MULTIVITAMIN W/MINERALS CH: 1.0000 | ORAL_TABLET | Freq: Every day | ORAL | 0 refills | Status: AC

## 2022-01-14 MED ORDER — ALBUTEROL SULFATE HFA 108 (90 BASE) MCG/ACT IN AERS: 2.0000 | INHALATION_SPRAY | Freq: Four times a day (QID) | RESPIRATORY_TRACT | 2 refills | Status: DC | PRN

## 2022-01-14 MED ORDER — AMOXICILLIN-POT CLAVULANATE 875-125 MG PO TABS: 1.0000 | ORAL_TABLET | Freq: Two times a day (BID) | ORAL | 0 refills | Status: AC

## 2022-01-14 MED ORDER — GUAIFENESIN-DM 100-10 MG/5ML PO SYRP: 5.0000 mL | ORAL_SOLUTION | ORAL | 0 refills | Status: DC | PRN

## 2022-01-14 MED ORDER — PREDNISONE 10 MG PO TABS: 30.0000 mg | ORAL_TABLET | Freq: Every day | ORAL | 0 refills | Status: AC

## 2022-01-14 MED ORDER — DIPHENHYDRAMINE HCL 25 MG PO CAPS: 25.0000 mg | ORAL_CAPSULE | Freq: Three times a day (TID) | ORAL | 0 refills | Status: DC | PRN

## 2022-01-14 NOTE — Discharge Summary (Signed)
Physician Discharge Summary  Jennifer Sandoval RWE:[id number] DOB: May 11, 1955 DOA: 01/07/2022  PCP: Mosie Lukes, MD  Admit date: 01/07/2022 Discharge date: 01/14/2022  Admitted From: Home  Discharge disposition: Home  Recommendations for Outpatient Follow-Up:   Follow up with your primary care provider in one week.  Check CBC, BMP, magnesium in the next visit   Discharge Diagnosis:   Principal Problem:   Multifocal pneumonia Active Problems:   Depression with anxiety   Influenza B   Bronchitis with acute wheezing   Bacteremia due to Streptococcus pneumoniae   Discharge Condition: Improved.  Diet recommendation:   Regular.  Wound care: None.  Code status: Full.   History of Present Illness:   Jennifer Sandoval is a 67 y.o. female with past medical history of depression presented to the hospital with shortness of breath.  She does have a recurrent sinus infection and shortly after Christmas was on Augmentin which she completed but again started having subjective fever, chills diaphoresis with cough, body aches and fatigue.  Patient was then seen in the ED 01/04/2022 for shortness of breath and tested positive for influenza A.  Chest x-ray showed bibasilar atelectasis.  She was given steroids, nebulizers and was discharged home on course of prednisone and albuterol.  She did have some improvement in her symptoms but had persistent dyspnea and pleuritic chest pain show she again presented to the hospital.  On this presentation, patient was tachycardic, febrile with a temperature of 100.7 F and tachypneic and was put on 2 L of oxygen by nasal cannula.  Initial labs showed no leukocytosis with a lactate of 1.5.  Urinalysis was negative.  Blood cultures were sent.  Chest x-ray showed bilateral airspace opacities density at the right base and left midlung.  Patient received Ringer lactate, IV Rocephin, Zithromax and was admitted hospital for further evaluation and treatment.    Hospital  Course:   Following conditions were addressed during hospitalization as listed below,  Influenza B with multifocal pneumonia, streptococcal pneumonia: Diagnosed with influenza on 01/04/22.  Presented with dyspnea with new airspace opacities.  Patient was given Tamiflu and was started empirically on Rocephin.   Initial blood cultures showed strep pneumonia.  ID was verbally consulted recommended repeating blood cultures.  Repeat blood cultures were negative so at this time 2D echo was not considered.  Augmentin on discharge was considered after talking to Dr. Gale Journey ID to complete total 10-day course.    Initial procalcitonin was 7.2.  Strep urinary antigen was positive.  Legionella antigen was negative.  patient did have some leukocytosis but was on steroids as well.  At this time patient has significantly improved clinically and will be discharged home on Augmentin to complete the course.  Streptococcal bacteremia. Received IV Rocephin during hospitalization.  Transition to Augmentin on discharge.  Spoke with ID prior to discharge.   Depression/anxiety: Continue sertraline.  Disposition.  At this time, patient is stable for disposition home with outpatient PCP follow-up  Medical Consultants:   Verbal consult with infectious disease  Procedures:    None Subjective:   Today, patient was seen and examined at bedside.  Continues to feel better.  Denies any chest pain, nausea, vomiting, fever, chills or rigor.  On room air.  Breathing status has improved.  Discharge Exam:   Vitals:   01/13/22 2148 01/14/22 0610  BP: 132/77 (!) 141/82  Pulse: 97 73  Resp: 17 16  Temp: 98.3 F (36.8 C) 98.5 F (36.9 C)  SpO2: 91% 91%  Vitals:   01/13/22 1903 01/13/22 2100 01/13/22 2148 01/14/22 0610  BP: 101/78 112/70 132/77 (!) 141/82  Pulse: 90 85 97 73  Resp: '18 18 17 16  '$ Temp: 98.7 F (37.1 C) 98.5 F (36.9 C) 98.3 F (36.8 C) 98.5 F (36.9 C)  TempSrc: Oral Oral Oral Oral  SpO2: 92%  91%  91%  Weight:      Height:       Body mass index is 23.35 kg/m.  General: Alert awake, not in obvious distress, on room air HENT: pupils equally reacting to light,  No scleral pallor or icterus noted. Oral mucosa is moist.  Chest:    Diminished breath sounds bilaterally.  Coarse breath sounds noted CVS: S1 &S2 heard. No murmur.  Regular rate and rhythm. Abdomen: Soft, nontender, nondistended.  Bowel sounds are heard.   Extremities: No cyanosis, clubbing or edema.  Peripheral pulses are palpable. Psych: Alert, awake and oriented, normal mood CNS:  No cranial nerve deficits.  Power equal in all extremities.   Skin: Warm and dry.  No rashes noted.  The results of significant diagnostics from this hospitalization (including imaging, microbiology, ancillary and laboratory) are listed below for reference.     Diagnostic Studies:   DG Chest Port 1 View  Result Date: 01/07/2022 CLINICAL DATA:  Dry cough with fever and shortness of breath. Recent diagnosis of flu EXAM: PORTABLE CHEST 1 VIEW COMPARISON:  01/04/2022 FINDINGS: Bilateral airspace disease densest at the right base and in the left mid lung. No cavitation or effusion is seen. No pulmonary edema. Stable heart size and mediastinal contours. IMPRESSION: Multifocal/bilateral pneumonia. Electronically Signed   By: Jorje Guild M.D.   On: 01/07/2022 10:23     Labs:   Basic Metabolic Panel: Recent Labs  Lab 01/08/22 0531 01/12/22 0455 01/13/22 0711  NA 138 138 140  K 4.1 3.5 4.1  CL 107 102 101  CO2 '25 30 30  '$ GLUCOSE 90 100* 90  BUN '23 11 10  '$ CREATININE 0.54 0.52 0.50  CALCIUM 8.2* 7.9* 8.4*  MG  --   --  2.2   GFR Estimated Creatinine Clearance: 55.4 mL/min (by C-G formula based on SCr of 0.5 mg/dL). Liver Function Tests: No results for input(s): "AST", "ALT", "ALKPHOS", "BILITOT", "PROT", "ALBUMIN" in the last 168 hours.  No results for input(s): "LIPASE", "AMYLASE" in the last 168 hours. No results for input(s):  "AMMONIA" in the last 168 hours. Coagulation profile No results for input(s): "INR", "PROTIME" in the last 168 hours.   CBC: Recent Labs  Lab 01/10/22 1144 01/11/22 0525 01/12/22 0455 01/13/22 0711 01/14/22 0341  WBC 17.3* 16.7* 13.8* 16.7* 13.2*  NEUTROABS 14.2* 11.2* 9.7*  --   --   HGB 10.8* 9.7* 9.6* 10.6* 10.1*  HCT 33.7* 30.0* 30.1* 33.4* 31.8*  MCV 93.4 92.3 92.6 93.0 94.6  PLT 368 386 387 526* 549*   Cardiac Enzymes: No results for input(s): "CKTOTAL", "CKMB", "CKMBINDEX", "TROPONINI" in the last 168 hours. BNP: Invalid input(s): "POCBNP" CBG: No results for input(s): "GLUCAP" in the last 168 hours. D-Dimer No results for input(s): "DDIMER" in the last 72 hours. Hgb A1c No results for input(s): "HGBA1C" in the last 72 hours. Lipid Profile No results for input(s): "CHOL", "HDL", "LDLCALC", "TRIG", "CHOLHDL", "LDLDIRECT" in the last 72 hours. Thyroid function studies No results for input(s): "TSH", "T4TOTAL", "T3FREE", "THYROIDAB" in the last 72 hours.  Invalid input(s): "FREET3" Anemia work up No results for input(s): "VITAMINB12", "FOLATE", "FERRITIN", "TIBC", "IRON", "RETICCTPCT"  in the last 72 hours. Microbiology Recent Results (from the past 240 hour(s))  Culture, blood (Routine x 2)     Status: None   Collection Time: 01/07/22 10:33 AM   Specimen: BLOOD RIGHT FOREARM  Result Value Ref Range Status   Specimen Description   Final    BLOOD RIGHT FOREARM BLOOD Performed at [facility], [address] [place], [place] [address]    Special Requests   Final    Blood Culture results may not be optimal due to an inadequate volume of blood received in culture bottles [place] [place] Performed at [facility], [address], [place], [place] [address]    Culture   Final    NO GROWTH 5 DAYS Performed at [facility], [place] [address], [place], [place] [address]    Report Status 01/12/2022 FINAL  Final  Culture,  blood (Routine x 2)     Status: Abnormal   Collection Time: 01/07/22 10:33 AM   Specimen: BLOOD LEFT FOREARM  Result Value Ref Range Status   Specimen Description   Final    BLOOD LEFT FOREARM Performed at [facility], [place] [address], [place], [place] [address]    Special Requests   Final    Blood Culture results may not be optimal due to an inadequate volume of blood received in culture bottles BOTTLES DRAWN AEROBIC AND ANAEROBIC Performed at [facility], [place] [address] [place], [place] [address]    Culture  Setup Time   Final    GRAM POSITIVE COCCI ANAEROBIC BOTTLE ONLY CRITICAL RESULT CALLED TO, READ BACK BY AND VERIFIED WITH: Otila Back [id number] AT 6606 BY CM Performed at [facility], [place] [address], [place], [place] [address]    Culture STREPTOCOCCUS PNEUMONIAE (A)  Final   Report Status 01/10/2022 FINAL  Final   Organism ID, Bacteria STREPTOCOCCUS PNEUMONIAE  Final      Susceptibility   Streptococcus pneumoniae - MIC*    ERYTHROMYCIN <=0.12 SENSITIVE Sensitive     LEVOFLOXACIN 1 SENSITIVE Sensitive     VANCOMYCIN 0.5 SENSITIVE Sensitive     PENICILLIN (meningitis) <=0.06 SENSITIVE Sensitive     PENO - penicillin <=0.06      PENICILLIN (non-meningitis) <=0.06 SENSITIVE Sensitive     PENICILLIN (oral) <=0.06 SENSITIVE Sensitive     CEFTRIAXONE (non-meningitis) <=0.12 SENSITIVE Sensitive     CEFTRIAXONE (meningitis) <=0.12 SENSITIVE Sensitive     * STREPTOCOCCUS PNEUMONIAE  Blood Culture ID Panel (Reflexed)     Status: Abnormal   Collection Time: 01/07/22 10:33 AM  Result Value Ref Range Status   Enterococcus faecalis NOT DETECTED NOT DETECTED Final   Enterococcus Faecium NOT DETECTED NOT DETECTED Final   Listeria monocytogenes NOT DETECTED NOT DETECTED Final   Staphylococcus species NOT DETECTED NOT DETECTED Final   Staphylococcus aureus (BCID) NOT DETECTED NOT DETECTED Final   Staphylococcus epidermidis NOT DETECTED NOT DETECTED Final    Staphylococcus lugdunensis NOT DETECTED NOT DETECTED Final   Streptococcus species DETECTED (A) NOT DETECTED Final    Comment: CRITICAL RESULT CALLED TO, READ BACK BY AND VERIFIED WITH: PHARMD J LEGGE [id number] AT 1302 BY CM    Streptococcus agalactiae NOT DETECTED NOT DETECTED Final   Streptococcus pneumoniae DETECTED (A) NOT DETECTED Final    Comment: CRITICAL RESULT CALLED TO, READ BACK BY AND VERIFIED WITH: PHARMD J LEGGE [id number] AT 1302 BY CM    Streptococcus  pyogenes NOT DETECTED NOT DETECTED Final   A.calcoaceticus-baumannii NOT DETECTED NOT DETECTED Final   Bacteroides fragilis NOT DETECTED NOT DETECTED Final   Enterobacterales NOT DETECTED NOT DETECTED Final   Enterobacter cloacae complex NOT DETECTED NOT DETECTED Final   Escherichia coli NOT DETECTED NOT DETECTED Final   Klebsiella aerogenes NOT DETECTED NOT DETECTED Final   Klebsiella oxytoca NOT DETECTED NOT DETECTED Final   Klebsiella pneumoniae NOT DETECTED NOT DETECTED Final   Proteus species NOT DETECTED NOT DETECTED Final   Salmonella species NOT DETECTED NOT DETECTED Final   Serratia marcescens NOT DETECTED NOT DETECTED Final   Haemophilus influenzae NOT DETECTED NOT DETECTED Final   Neisseria meningitidis NOT DETECTED NOT DETECTED Final   Pseudomonas aeruginosa NOT DETECTED NOT DETECTED Final   Stenotrophomonas maltophilia NOT DETECTED NOT DETECTED Final   Candida albicans NOT DETECTED NOT DETECTED Final   Candida auris NOT DETECTED NOT DETECTED Final   Candida glabrata NOT DETECTED NOT DETECTED Final   Candida krusei NOT DETECTED NOT DETECTED Final   Candida parapsilosis NOT DETECTED NOT DETECTED Final   Candida tropicalis NOT DETECTED NOT DETECTED Final   Cryptococcus neoformans/gattii NOT DETECTED NOT DETECTED Final    Comment: Performed at [facility], [address], [place], [place] [address]  Culture, blood (Routine X 2) w Reflex to ID Panel     Status: None   Collection Time: 01/09/22  1:18 PM    Specimen: BLOOD RIGHT ARM  Result Value Ref Range Status   Specimen Description   Final    BLOOD RIGHT ARM Performed at [facility] [address], [place], [place] [address]    Special Requests   Final    BOTTLES DRAWN AEROBIC AND ANAEROBIC Blood Culture adequate volume Performed at [place] [address], [place], [place] [address]    Culture   Final    NO GROWTH 5 DAYS Performed at [facility], [place] [address], [place], [place] [address]    Report Status 01/14/2022 FINAL  Final  Culture, blood (Routine X 2) w Reflex to ID Panel     Status: None   Collection Time: 01/09/22  1:29 PM   Specimen: BLOOD RIGHT HAND  Result Value Ref Range Status   Specimen Description   Final    BLOOD RIGHT HAND Performed at [facility] [address] [place], [place] [address]    Special Requests   Final    BOTTLES DRAWN AEROBIC AND ANAEROBIC Blood Culture adequate volume Performed at [facility] [address], [place], [place] [address]    Culture   Final    NO GROWTH 5 DAYS Performed at [facility], [place] [address] [place], [place] [address]    Report Status 01/14/2022 FINAL  Final  Expectorated Sputum Assessment w Gram Stain, Rflx to Resp Cult     Status: None   Collection Time: 01/10/22 12:33 AM   Specimen: Expectorated Sputum  Result Value Ref Range Status   Specimen Description EXPECTORATED SPUTUM  Final   Special Requests NONE  Final   Sputum evaluation   Final    THIS SPECIMEN IS ACCEPTABLE FOR SPUTUM CULTURE Performed at [facility], [place] [address], [place], [place] [address]    Report Status 01/10/2022 FINAL  Final  Culture, Respiratory w Gram Stain     Status: None   Collection Time: 01/10/22 12:33 AM  Result Value Ref Range Status  Specimen Description   Final    EXPECTORATED SPUTUM Performed at [facility] [address]  [place], [place] [address]    Special Requests   Final    NONE Reflexed from [id number] Performed at [facility], [place] [address] [place], [place] [address]    Gram Stain   Final    FEW WBC PRESENT, PREDOMINANTLY MONONUCLEAR FEW GRAM POSITIVE RODS RARE GRAM NEGATIVE RODS RARE YEAST WITH PSEUDOHYPHAE    Culture   Final    FEW Normal respiratory flora-no Staph aureus or Pseudomonas seen Performed at [facility], [address], [place], [place] [address]    Report Status 01/12/2022 FINAL  Final     Discharge Instructions:   Discharge Instructions     Diet general   Complete by: As directed    Discharge instructions   Complete by: As directed    Follow-up with your primary care provider in 1 week.  Complete the course of antibiotic and prednisone.  Seek medical attention for worsening symptoms. Avoid cold icy stuff for next few days. No overexertion.   Increase activity slowly   Complete by: As directed       Allergies as of 01/14/2022       Reactions   Itraconazole    REACTION: Rash   Levaquin [levofloxacin In D5w]    Joint pain        Medication List     TAKE these medications    albuterol 108 (90 Base) MCG/ACT inhaler Commonly known as: VENTOLIN HFA Inhale 2 puffs into the lungs every 6 (six) hours as needed for wheezing or shortness of breath.   amoxicillin-clavulanate 875-125 MG tablet Commonly known as: AUGMENTIN Take 1 tablet by mouth 2 (two) times daily for 5 days.   diphenhydrAMINE 25 mg capsule Commonly known as: BENADRYL Take 1 capsule (25 mg total) by mouth every 8 (eight) hours as needed for itching or allergies.   guaiFENesin-dextromethorphan 100-10 MG/5ML syrup Commonly known as: ROBITUSSIN DM Take 5 mLs by mouth every 4 (four) hours as needed for cough.   multivitamin with minerals Tabs tablet Take 1 tablet by mouth daily.   predniSONE 10 MG tablet Commonly known as: DELTASONE Take 3 tablets (30 mg total) by mouth  daily with breakfast for 3 days.   sertraline 100 MG tablet Commonly known as: ZOLOFT TAKE 1 AND 1/2 TABLETS DAILY BY MOUTH What changed: See the new instructions.        Follow-up Information     Mosie Lukes, MD Follow up in 1 week(s).   Specialty: Family Medicine Contact information: [place] [address] [phone]                 Time coordinating discharge: 39 minutes  Signed:  Ahmarion Saraceno  [facility] 01/14/2022, 11:49 AM

## 2022-01-15 ENCOUNTER — Telehealth: Payer: Self-pay | Admitting: *Deleted

## 2022-01-15 ENCOUNTER — Encounter: Payer: Self-pay | Admitting: *Deleted

## 2022-01-15 NOTE — Patient Outreach (Signed)
  Care Coordination [facility] Note Transition Care Management Follow-up Telephone Call Date of discharge and from where: Sunday, 01/14/22 [place]; shortness of breath; multifocal pneumonia due to influenza A How have you been since you were released from the hospital? "I am okay but just very weak.  My family is here watching out for me but I am able to do most things for myself, I am just moving slow.  I am taking all of the medications like they told me to.  I am using the little breathing machine they gave me and I am trying to walk around the house as much as I can to keep my lungs open.  I need to get my hospital follow up visit scheduled with Dr. Randel Pigg-- please do have someone call me to help with that" Any questions or concerns? No  Items Reviewed: Did the pt receive and understand the discharge instructions provided? Yes  Medications obtained and verified? Yes  full medication review completed; patient confirms she has obtained and is taking all medications as instructed post-recent hospital discharge; confirms she is self-managing medications; verbalizes excellent understanding of purpose/ scheduling/ dosing of medications Other? No  Any new allergies since your discharge? No  Dietary orders reviewed? Yes Do you have support at home? Yes  patient reports she is essentially independent in all aspects of self-care; family assisting as/ if needed  Home Care and Equipment/Supplies: Were home health services ordered? no If so, what is the name of the agency? N/A  Has the agency set up a time to come to the patient's home? not applicable Were any new equipment or medical supplies ordered?  No What is the name of the medical supply agency? N/A Were you able to get the supplies/equipment? not applicable Do you have any questions related to the use of the equipment or supplies? No N/A  Functional Questionnaire: (I = Independent and D = Dependent) ADLs: I  Bathing/Dressing- I  Meal Prep-  I  Eating- I  Maintaining continence- I  Transferring/Ambulation- I  Managing Meds- I  Follow up appointments reviewed:  PCP Hospital f/u appt confirmed? No  Scheduled to see - on - @ - sent request to scheduling care guide to facilitate scheduling hospital follow up PCP appointment Specialist Hospital f/u appt confirmed? No  Scheduled to see - on - @ - Are transportation arrangements needed? No  If their condition worsens, is the pt aware to call PCP or go to the Emergency Dept.? Yes Was the patient provided with contact information for the PCP's office or ED? No- patient declined; reports already has contact information for all care providers Was to pt encouraged to call back with questions or concerns? Yes  SDOH assessments and interventions completed:   Yes SDOH Interventions Today    Flowsheet Row Most Recent Value  SDOH Interventions   Food Insecurity Interventions Intervention Not Indicated  Transportation Interventions Intervention Not Indicated  [family provides transportation after recent hospitalization]      Care Coordination Interventions:  PCP follow up appointment requested Provided education around side effects of antibiotic therapy; purpose of probiotics; full medication review completed; provided education around value of remaining active without over-doing/ using IS regularly at home    Encounter Outcome:  Pt. Visit Completed    Oneta Rack, RN, BSN, CCRN Alumnus RN CM Care Coordination/ [facility] [place] Management [phone]: direct office

## 2022-01-16 ENCOUNTER — Telehealth: Payer: Self-pay | Admitting: *Deleted

## 2022-01-16 NOTE — Progress Notes (Signed)
  Care Coordination  Note  01/16/2022 Name: Jennifer Sandoval MRN: [id number] DOB: 03-13-1955  Jennifer Sandoval is a 67 y.o. year old primary care patient of Mosie Lukes, MD. I reached out to [name] by phone today to assist with scheduling a follow up appointment. [name] verbally consented to my assistance.       Follow up plan: Unsuccessful telephone outreach attempt made. A HIPAA compliant phone message was left for the patient providing contact information and requesting a return call.   Julian Hy, [place] Direct Dial: [phone]

## 2022-01-16 NOTE — Progress Notes (Signed)
  Care Coordination  Note  01/16/2022 Name: Jennifer Sandoval MRN: [id number] DOB: [birth year]  Jennifer Sandoval is a 67 y.o. year old primary care patient of Mosie Lukes, MD. I reached out to [name] by phone today to assist with scheduling a follow up appointment. [name] verbally consented to my assistance.       Follow up plan: Hospital Follow Up appointment scheduled with (Dr Etter Sjogren) on (01/23/2022) at (220pm).  Julian Hy, [place] Direct Dial: [phone]

## 2022-01-23 ENCOUNTER — Ambulatory Visit (INDEPENDENT_AMBULATORY_CARE_PROVIDER_SITE_OTHER): Payer: PPO | Admitting: Family Medicine

## 2022-01-23 ENCOUNTER — Encounter: Payer: Self-pay | Admitting: Family Medicine

## 2022-01-23 VITALS — BP 124/80 | HR 90 | Temp 98.7°F | Resp 18 | Ht 62.0 in | Wt 119.0 lb

## 2022-01-23 DIAGNOSIS — J189 Pneumonia, unspecified organism: Secondary | ICD-10-CM

## 2022-01-23 DIAGNOSIS — N390 Urinary tract infection, site not specified: Secondary | ICD-10-CM | POA: Diagnosis not present

## 2022-01-23 DIAGNOSIS — D229 Melanocytic nevi, unspecified: Secondary | ICD-10-CM | POA: Diagnosis not present

## 2022-01-23 DIAGNOSIS — Z23 Encounter for immunization: Secondary | ICD-10-CM | POA: Diagnosis not present

## 2022-01-23 DIAGNOSIS — J069 Acute upper respiratory infection, unspecified: Secondary | ICD-10-CM

## 2022-01-23 DIAGNOSIS — H6522 Chronic serous otitis media, left ear: Secondary | ICD-10-CM

## 2022-01-23 LAB — POC URINALSYSI DIPSTICK (AUTOMATED)
Bilirubin, UA: NEGATIVE
Blood, UA: NEGATIVE
Glucose, UA: NEGATIVE
Leukocytes, UA: NEGATIVE
Nitrite, UA: NEGATIVE
Protein, UA: NEGATIVE
Spec Grav, UA: >=1.03 — AB (ref 1.010–1.025)
Urobilinogen, UA: 0.2 E.U./dL
pH, UA: 5 (ref 5.0–8.0)

## 2022-01-23 MED ORDER — AZELASTINE HCL 0.1 % NA SOLN: 1.0000 | Freq: Two times a day (BID) | NASAL | 12 refills | Status: DC

## 2022-01-23 MED ORDER — LEVOCETIRIZINE DIHYDROCHLORIDE 5 MG PO TABS: 5.0000 mg | ORAL_TABLET | Freq: Every evening | ORAL | 5 refills | Status: DC

## 2022-01-23 MED ORDER — FLUTICASONE PROPIONATE 50 MCG/ACT NA SUSP: 2.0000 | Freq: Every day | NASAL | 6 refills | Status: DC

## 2022-01-23 NOTE — Progress Notes (Addendum)
Subjective:   By signing my name below, I, [name], attest that this documentation has been prepared under the direction and in the presence of Ann Held, DO 01/23/22   Patient ID: Jennifer Sandoval, female    DOB: 1955-06-08, 67 y.o.   MRN: [id number]  Chief Complaint  Patient presents with   Hospitalization Follow-up    Pneumonia follow up    HPI Patient is in today for a hospital follow-up.  She was diagnosed in the ED with multifocal pneumonia on 01/07/2022. Her condition was improved when discharged on 01/14/2022.  Flu/Pneumonia She is not feeling totally recovered yet. She has finished her courses of Prednisone (3 days) and antibiotics (5 days). She still takes Mucinex. She produces phlegm rarely, has congestion and has pressure in her ears. In addition to pneumonia, she has also recently had flu, Covid-19, strep and a UTI. When she wakes up in the morning, her tongue feels covered with material. She gets intermittent pain in her upper abdomen. She is having severe dry skin that she believes is associated with the medications she's taking for pneumonia/flu.  She has a spot on the right side of her nose and would like to get a dermatologist referral for that.  She is interested in receiving flu and pneumonia vaccines today.    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   Dry eyes, bilateral 01/30/2016   Dyspepsia 04/19/2010   FRACTURE, NOSE 10/22/2007   History of gestational diabetes 01/30/2016   Insomnia 01/23/2012   Leiomyoma of uterus, unspecified 02/15/2010   Lumbar compression fracture ([place])    nonsurgical   OSTEOARTHRITIS  10/24/2006   PERIMENOPAUSAL STATUS 03/16/2010   Post-menopause 03/16/2010   Qualifier: Diagnosis of  By: Charlett Blake MD, [place]     Preventative health care 08/31/2012   SHOULDER PAIN, BILATERAL 10/10/2007    Past Surgical History:  Procedure Laterality Date   ABDOMINAL HYSTERECTOMY  01/02/2007   partial, ovaries left in place, for painful, heavy menstrrual bleeding secondary to anemia and fibroids   APPENDECTOMY     CESAREAN SECTION     X 3   CHOLECYSTECTOMY     GASTRIC BYPASS     KNEE SURGERY Right    2018 or 2019   NASAL SINUS SURGERY     SEPTOPLASTY     skin biopsy     L hip BCC, facial lesions were benign   TUBAL LIGATION     ventral herniorrhaphy     X 2    Family History  Problem Relation Age of Onset   Cholelithiasis Mother    Other Mother        Urinary incontince/ bladder prolapse/ h/o smoking   Cancer Mother        laryngeal carcinoma   Allergies Mother    Arthritis Mother        s/p knee and shoulder replacement   Dementia Mother        lewy body   Other Father        Renal failure   Hypertension Father    Coronary artery disease Father  s/p bypass   Aortic aneurysm Father    Hyperlipidemia Father    Heart disease Father 23       quadruple bypass   Spina bifida Brother        with stunt/ self cath   Seizures Brother        disorders   ADD / ADHD Daughter    Cancer Maternal Grandmother        colon/ smoker   Cancer Maternal Grandfather        lung   Cancer Paternal Grandmother        Breast   Heart disease Paternal Grandfather    Stroke Paternal Grandfather    ADD / ADHD Daughter     Social History   Socioeconomic History   Marital status: Married    Spouse name: Not on file   Number of children: Not on file   Years of education: Not on file   Highest education level: Not on file  Occupational History   Not on file  Tobacco Use   Smoking status: Never   Smokeless tobacco: Never  Vaping Use   Vaping Use: Never used  Substance and  Sexual Activity   Alcohol use: Yes    Comment: special occasion   Drug use: No   Sexual activity: Yes    Partners: Male  Other Topics Concern   Not on file  Social History Narrative   Not on file   Social Determinants of Health   Financial Resource Strain: Not on file  Food Insecurity: No Food Insecurity (01/15/2022)   Hunger Vital Sign    Worried About Running Out of Food in the Last Year: Never true    Ran Out of Food in the Last Year: Never true  Transportation Needs: No Transportation Needs (01/15/2022)   PRAPARE - Hydrologist (Medical): No    Lack of Transportation (Non-Medical): No  Physical Activity: Not on file  Stress: Not on file  Social Connections: Not on file  Intimate Partner Violence: Not At Risk (01/07/2022)   Humiliation, Afraid, Rape, and Kick questionnaire    Fear of Current or Ex-Partner: No    Emotionally Abused: No    Physically Abused: No    Sexually Abused: No    Outpatient Medications Prior to Visit  Medication Sig Dispense Refill   albuterol (VENTOLIN HFA) 108 (90 Base) MCG/ACT inhaler Inhale 2 puffs into the lungs every 6 (six) hours as needed for wheezing or shortness of breath. 8 g 2   diphenhydrAMINE (BENADRYL) 25 mg capsule Take 1 capsule (25 mg total) by mouth every 8 (eight) hours as needed for itching or allergies. 15 capsule 0   guaiFENesin-dextromethorphan (ROBITUSSIN DM) 100-10 MG/5ML syrup Take 5 mLs by mouth every 4 (four) hours as needed for cough. 118 mL 0   Multiple Vitamin (MULTIVITAMIN WITH MINERALS) TABS tablet Take 1 tablet by mouth daily. 100 tablet 0   sertraline (ZOLOFT) 100 MG tablet TAKE 1 AND 1/2 TABLETS DAILY BY MOUTH (Patient taking differently: Take 150 mg by mouth at bedtime.) 135 tablet 0   No facility-administered medications prior to visit.    Allergies  Allergen Reactions   Itraconazole     REACTION: Rash   Levaquin [Levofloxacin In D5w]     Joint pain    Review of Systems   Constitutional:  Positive for malaise/fatigue. Negative for fever.  HENT:  Positive for congestion and ear pain (Left ear pressure).   Eyes:  Negative for blurred vision.  Respiratory:  Positive for cough and sputum production (Rarely). Negative for shortness of breath.   Cardiovascular:  Negative for chest pain, palpitations and leg swelling.  Gastrointestinal:  Positive for abdominal pain (Upper abdomen). Negative for blood in stool and nausea.  Genitourinary:  Negative for dysuria and frequency.  Musculoskeletal:  Negative for falls.  Skin:  Negative for rash.       (+) Spot on right side of nose  Neurological:  Negative for dizziness, loss of consciousness and headaches.  Endo/Heme/Allergies:  Negative for environmental allergies.  Psychiatric/Behavioral:  Negative for depression. The patient is not nervous/anxious.        Objective:    Physical Exam Vitals and nursing note reviewed.  Constitutional:      General: She is not in acute distress.    Appearance: Normal appearance. She is not ill-appearing.  HENT:     Head: Normocephalic and atraumatic.     Right Ear: Tympanic membrane, ear canal and external ear normal.     Left Ear: Tympanic membrane, ear canal and external ear normal.     Ears:     Comments: Left ear TM bulging. Eyes:     Extraocular Movements: Extraocular movements intact.     Pupils: Pupils are equal, round, and reactive to light.  Cardiovascular:     Rate and Rhythm: Normal rate and regular rhythm.     Heart sounds: Normal heart sounds. No murmur heard.    No gallop.  Pulmonary:     Effort: Pulmonary effort is normal. No respiratory distress.     Breath sounds: Normal breath sounds. No wheezing or rales.  Abdominal:     General: There is no distension.     Tenderness: There is no abdominal tenderness. There is no guarding.  Skin:    General: Skin is warm and dry.     Comments: Flesh colored nevus on L side nose -- new and changing   Neurological:      Mental Status: She is alert and oriented to person, place, and time.  Psychiatric:        Judgment: Judgment normal.     BP 124/80 (BP Location: Left Arm, Patient Position: Sitting, Cuff Size: Normal)   Pulse 90   Temp 98.7 F (37.1 C) (Oral)   Resp 18   Ht '5\' 2"'$  (1.575 m)   Wt 119 lb (54 kg)   SpO2 97%   BMI 21.77 kg/m  Wt Readings from Last 3 Encounters:  01/23/22 119 lb (54 kg)  01/07/22 127 lb 10.3 oz (57.9 kg)  01/04/22 126 lb (57.2 kg)       Assessment & Plan:  Multifocal pneumonia -     CBC with Differential/Platelet -     Comprehensive metabolic panel -     Magnesium  Urinary tract infection without hematuria, site unspecified -     POCT Urinalysis Dipstick (Automated)  Left chronic serous otitis media -     Fluticasone Propionate; Place 2 sprays into both nostrils daily.  Dispense: 16 g; Refill: 6 -     Levocetirizine Dihydrochloride; Take 1 tablet (5 mg total) by mouth every evening.  Dispense: 30 tablet; Refill: 5  Viral upper respiratory tract infection -     Azelastine HCl; Place 1 spray into both nostrils 2 (two) times daily. Use in each nostril as directed  Dispense: 30 mL; Refill: 12 -     Levocetirizine Dihydrochloride; Take 1 tablet (5 mg total) by mouth  every evening.  Dispense: 30 tablet; Refill: 5  Need for influenza vaccination -     Flu Vaccine QUAD High Dose(Fluad)  Need for pneumococcal 20-valent conjugate vaccination -     Pneumococcal conjugate vaccine 20-valent  Suspicious nevus -     Ambulatory referral to Dermatology     I,Alexander Ruley,acting as a scribe for Ann Held, DO.,have documented all relevant documentation on the behalf of Ann Held, DO,as directed by  Ann Held, DO while in the presence of Ann Held, DO.   I, Ann Held, DO, personally preformed the services described in this documentation.  All medical record entries made by the scribe were at my direction and  in my presence.  I have reviewed the chart and discharge instructions (if applicable) and agree that the record reflects my personal performance and is accurate and complete. 01/23/22   Ann Held, DO

## 2022-01-23 NOTE — Patient Instructions (Signed)
Community-Acquired Pneumonia, Adult Pneumonia is a lung infection that causes inflammation and the buildup of mucus and fluids in the lungs. This may cause coughing and difficulty breathing. Community-acquired pneumonia is pneumonia that develops in people who are not, and have not recently been, in a hospital or other health care facility. Usually, pneumonia develops as a result of an illness that is caused by a virus, such as the common cold and the flu (influenza). It can also be caused by bacteria or fungi. While the common cold and influenza can pass from person to person (are contagious), pneumonia itself is not considered contagious. What are the causes? This condition may be caused by: Viruses. Bacteria. Fungi. What increases the risk? The following factors may make you more likely to develop this condition: Being over age 65 or having certain medical conditions, such as: A long-term (chronic) disease, such as: chronic obstructive pulmonary disease (COPD), asthma, heart failure, diabetes, or kidney disease. A condition that increases the risk of breathing in (aspirating) mucus and other fluids from your mouth and nose. A weakened body defense system (immune system). Having had your spleen removed (splenectomy). The spleen is the organ that helps fight germs and infections. Not cleaning your teeth and gums well (poor dental hygiene). Using tobacco products. Traveling to places where germs that cause pneumonia are present or being near certain animals or animal habitats that could have germs that cause pneumonia. What are the signs or symptoms? Symptoms of this condition include: A dry cough or a wet (productive) cough. A fever, sweating, or chills. Chest pain, especially when breathing deeply or coughing. Fast breathing, difficulty breathing, or shortness of breath. Tiredness (fatigue) and muscle aches. How is this diagnosed? This condition may be diagnosed based on your medical  history or a physical exam. You may also have tests, including: Imaging, such as a chest X-ray or lung ultrasound. Tests of: The level of oxygen and other gases in your blood. Mucus from your lungs (sputum). Fluid around your lungs (pleural fluid). Your urine. How is this treated? Treatment for this condition depends on many factors, such as the cause of your pneumonia, your medicines, and other medical conditions that you have. For most adults, pneumonia may be treated at home. In some cases, treatment must happen in a hospital and may include: Medicines that are given by mouth (orally) or through an IV, including: Antibiotic medicines, if bacteria caused the pneumonia. Medicines that kill viruses (antiviral medicines), if a virus caused the pneumonia. Oxygen therapy. Severe pneumonia, although rare, may require the following treatments: Mechanical ventilation.This procedure uses a machine to help you breathe if you cannot breathe well on your own or maintain a safe level of blood oxygen. Thoracentesis. This procedure removes any buildup of pleural fluid to help with breathing. Follow these instructions at home:  Medicines Take over-the-counter and prescription medicines only as told by your health care provider. Take cough medicine only if you have trouble sleeping. Cough medicine can prevent your body from removing mucus from your lungs. If you were prescribed antibiotics, take them as told by your health care provider. Do not stop taking the antibiotic even if you start to feel better. Lifestyle     Do not drink alcohol. Do not use any products that contain nicotine or tobacco. These products include cigarettes, chewing tobacco, and vaping devices, such as e-cigarettes. If you need help quitting, ask your health care provider. Eat a healthy diet. This includes plenty of vegetables, fruits, whole grains, low-fat   dairy products, and lean protein. General instructions Rest a lot and  get at least 8 hours of sleep each night. Sleep in a partly upright position at night. Place a few pillows under your head or sleep in a reclining chair. Return to your normal activities as told by your health care provider. Ask your health care provider what activities are safe for you. Drink enough fluid to keep your urine pale yellow. This helps to thin the mucus in your lungs. If your throat is sore, gargle with a mixture of salt and water 3-4 times a day or as needed. To make salt water, completely dissolve -1 tsp (3-6 g) of salt in 1 cup (237 mL) of warm water. Keep all follow-up visits. How is this prevented? You can lower your risk of developing community-acquired pneumonia by: Getting the pneumonia vaccine. There are different types and schedules of pneumonia vaccines. Ask your health care provider which option is best for you. Consider getting the pneumonia vaccine if: You are older than 65 years of age. You are 19-65 years of age and are receiving cancer treatment, have chronic lung disease, or have other medical conditions that affect your immune system. Ask your health care provider if this applies to you. Getting your influenza vaccine every year. Ask your health care provider which type of vaccine is best for you. Getting regular dental checkups. Washing your hands often with soap and water for at least 20 seconds. If soap and water are not available, use hand sanitizer. Contact a health care provider if: You have a fever. You have trouble sleeping because you cannot control your cough with cough medicine. Get help right away if: Your shortness of breath becomes worse. Your chest pain increases. Your sickness becomes worse, especially if you are an older adult or have a weak immune system. You cough up blood. These symptoms may be an emergency. Get help right away. Call 911. Do not wait to see if the symptoms will go away. Do not drive yourself to the  hospital. Summary Pneumonia is an infection of the lungs. Community-acquired pneumonia develops in people who have not been in the hospital. It can be caused by bacteria, viruses, or fungi. This condition may be treated with antibiotics or antiviral medicines. Severe pneumonia may require a hospital stay and treatment to help with breathing. This information is not intended to replace advice given to you by your health care provider. Make sure you discuss any questions you have with your health care provider. Document Revised: 02/15/2021 Document Reviewed: 02/15/2021 Elsevier Patient Education  2023 [employer]  

## 2022-01-23 NOTE — Progress Notes (Signed)
Subjective:   By signing my name below, I, [name], attest that this documentation has been prepared under the direction and in the presence of Ann Held, DO 01/23/22   Patient ID: Jennifer Sandoval, female    DOB: [birth year], 67 y.o.   MRN: [id number]  Chief Complaint  Patient presents with   Hospitalization Follow-up    Pneumonia follow up    HPI Patient is in today for an ED follow-up.  She was diagnosed in the ED on 01/07/2022 with multifocal pneumonia. Her condition was improved upon discharge on 01/14/2022.     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   Dry eyes, bilateral 01/30/2016   Dyspepsia 04/19/2010   FRACTURE, NOSE 10/22/2007   History of gestational diabetes 01/30/2016   Insomnia 01/23/2012   Leiomyoma of uterus, unspecified 02/15/2010   Lumbar compression fracture ([place])    nonsurgical   OSTEOARTHRITIS 10/24/2006   PERIMENOPAUSAL STATUS 03/16/2010   Post-menopause 03/16/2010   Qualifier: Diagnosis of  By: Charlett Blake MD, [place]     Preventative health care 08/31/2012   SHOULDER PAIN, BILATERAL 10/10/2007    Past Surgical History:  Procedure Laterality Date   ABDOMINAL HYSTERECTOMY  01/02/2007   partial, ovaries left in place, for painful, heavy menstrrual bleeding secondary to anemia and fibroids   APPENDECTOMY     CESAREAN SECTION     X 3   CHOLECYSTECTOMY     GASTRIC BYPASS     KNEE SURGERY Right    2018 or 2019   NASAL SINUS SURGERY     SEPTOPLASTY     skin biopsy     L hip BCC, facial lesions were benign   TUBAL LIGATION     ventral herniorrhaphy     X 2    Family History  Problem Relation Age of Onset    Cholelithiasis Mother    Other Mother        Urinary incontince/ bladder prolapse/ h/o smoking   Cancer Mother        laryngeal carcinoma   Allergies Mother    Arthritis Mother        s/p knee and shoulder replacement   Dementia Mother        lewy body   Other Father        Renal failure   Hypertension Father    Coronary artery disease Father        s/p bypass   Aortic aneurysm Father    Hyperlipidemia Father    Heart disease Father 58       quadruple bypass   Spina bifida Brother        with stunt/ self cath   Seizures Brother        disorders   ADD / ADHD Daughter    Cancer Maternal Grandmother        colon/ smoker   Cancer Maternal Grandfather        lung   Cancer Paternal Grandmother        Breast   Heart disease Paternal Grandfather    Stroke Paternal Grandfather    ADD /  ADHD Daughter     Social History   Socioeconomic History   Marital status: Married    Spouse name: Not on file   Number of children: Not on file   Years of education: Not on file   Highest education level: Not on file  Occupational History   Not on file  Tobacco Use   Smoking status: Never   Smokeless tobacco: Never  Vaping Use   Vaping Use: Never used  Substance and Sexual Activity   Alcohol use: Yes    Comment: special occasion   Drug use: No   Sexual activity: Yes    Partners: Male  Other Topics Concern   Not on file  Social History Narrative   Not on file   Social Determinants of Health   Financial Resource Strain: Not on file  Food Insecurity: No Food Insecurity (01/15/2022)   Hunger Vital Sign    Worried About Running Out of Food in the Last Year: Never true    Ran Out of Food in the Last Year: Never true  Transportation Needs: No Transportation Needs (01/15/2022)   PRAPARE - Hydrologist (Medical): No    Lack of Transportation (Non-Medical): No  Physical Activity: Not on file  Stress: Not on file  Social Connections: Not on file   Intimate Partner Violence: Not At Risk (01/07/2022)   Humiliation, Afraid, Rape, and Kick questionnaire    Fear of Current or Ex-Partner: No    Emotionally Abused: No    Physically Abused: No    Sexually Abused: No    Outpatient Medications Prior to Visit  Medication Sig Dispense Refill   albuterol (VENTOLIN HFA) 108 (90 Base) MCG/ACT inhaler Inhale 2 puffs into the lungs every 6 (six) hours as needed for wheezing or shortness of breath. 8 g 2   diphenhydrAMINE (BENADRYL) 25 mg capsule Take 1 capsule (25 mg total) by mouth every 8 (eight) hours as needed for itching or allergies. 15 capsule 0   guaiFENesin-dextromethorphan (ROBITUSSIN DM) 100-10 MG/5ML syrup Take 5 mLs by mouth every 4 (four) hours as needed for cough. 118 mL 0   Multiple Vitamin (MULTIVITAMIN WITH MINERALS) TABS tablet Take 1 tablet by mouth daily. 100 tablet 0   sertraline (ZOLOFT) 100 MG tablet TAKE 1 AND 1/2 TABLETS DAILY BY MOUTH (Patient taking differently: Take 150 mg by mouth at bedtime.) 135 tablet 0   No facility-administered medications prior to visit.    Allergies  Allergen Reactions   Itraconazole     REACTION: Rash   Levaquin [Levofloxacin In D5w]     Joint pain    ROS     Objective:    Physical Exam  BP 124/80 (BP Location: Left Arm, Patient Position: Sitting, Cuff Size: Normal)   Pulse 90   Temp 98.7 F (37.1 C) (Oral)   Resp 18   Ht '5\' 2"'$  (1.575 m)   Wt 119 lb (54 kg)   SpO2 97%   BMI 21.77 kg/m  Wt Readings from Last 3 Encounters:  01/23/22 119 lb (54 kg)  01/07/22 127 lb 10.3 oz (57.9 kg)  01/04/22 126 lb (57.2 kg)       Assessment & Plan:  Multifocal pneumonia -     CBC with Differential/Platelet -     Comprehensive metabolic panel -     Magnesium  Urinary tract infection without hematuria, site unspecified -     POCT Urinalysis Dipstick (Automated)  Left chronic serous otitis media -  Fluticasone Propionate; Place 2 sprays into both nostrils daily.  Dispense: 16 g;  Refill: 6 -     Levocetirizine Dihydrochloride; Take 1 tablet (5 mg total) by mouth every evening.  Dispense: 30 tablet; Refill: 5  Viral upper respiratory tract infection -     Azelastine HCl; Place 1 spray into both nostrils 2 (two) times daily. Use in each nostril as directed  Dispense: 30 mL; Refill: 12 -     Levocetirizine Dihydrochloride; Take 1 tablet (5 mg total) by mouth every evening.  Dispense: 30 tablet; Refill: 5  Need for influenza vaccination -     Flu Vaccine QUAD High Dose(Fluad)  Need for pneumococcal 20-valent conjugate vaccination -     Pneumococcal conjugate vaccine 20-valent  Suspicious nevus -     Ambulatory referral to Dermatology     I,Alexander Ruley,acting as a scribe for Ann Held, DO.,have documented all relevant documentation on the behalf of Ann Held, DO,as directed by  Ann Held, DO while in the presence of Ann Held, DO.   I, Ann Held, DO, personally preformed the services described in this documentation.  All medical record entries made by the scribe were at my direction and in my presence.  I have reviewed the chart and discharge instructions (if applicable) and agree that the record reflects my personal performance and is accurate and complete. 01/23/22   Ann Held, DO

## 2022-01-24 LAB — CBC WITH DIFFERENTIAL/PLATELET
Basophils Absolute: 0 10*3/uL (ref 0.0–0.1)
Basophils Relative: 0.3 % (ref 0.0–3.0)
Eosinophils Absolute: 0.2 10*3/uL (ref 0.0–0.7)
Eosinophils Relative: 1.3 % (ref 0.0–5.0)
HCT: 33.4 % — ABNORMAL LOW (ref 36.0–46.0)
Hemoglobin: 10.9 g/dL — ABNORMAL LOW (ref 12.0–15.0)
Lymphocytes Relative: 20.4 % (ref 12.0–46.0)
Lymphs Abs: 2.5 10*3/uL (ref 0.7–4.0)
MCHC: 32.5 g/dL (ref 30.0–36.0)
MCV: 92.6 fl (ref 78.0–100.0)
Monocytes Absolute: 0.6 10*3/uL (ref 0.1–1.0)
Monocytes Relative: 4.7 % (ref 3.0–12.0)
Neutro Abs: 9.1 10*3/uL — ABNORMAL HIGH (ref 1.4–7.7)
Neutrophils Relative %: 73.3 % (ref 43.0–77.0)
Platelets: 835 10*3/uL — ABNORMAL HIGH (ref 150.0–400.0)
RBC: 3.61 Mil/uL — ABNORMAL LOW (ref 3.87–5.11)
RDW: 15.4 % (ref 11.5–15.5)
WBC: 12.4 10*3/uL — ABNORMAL HIGH (ref 4.0–10.5)

## 2022-01-24 LAB — COMPREHENSIVE METABOLIC PANEL
ALT: 14 U/L (ref 0–35)
AST: 15 U/L (ref 0–37)
Albumin: 3.5 g/dL (ref 3.5–5.2)
Alkaline Phosphatase: 84 U/L (ref 39–117)
BUN: 17 mg/dL (ref 6–23)
CO2: 28 mEq/L (ref 19–32)
Calcium: 9 mg/dL (ref 8.4–10.5)
Chloride: 103 mEq/L (ref 96–112)
Creatinine, Ser: 0.58 mg/dL (ref 0.40–1.20)
GFR: 95.04 mL/min (ref >60.00)
Glucose, Bld: 81 mg/dL (ref 70–99)
Potassium: 4.8 mEq/L (ref 3.5–5.1)
Sodium: 141 mEq/L (ref 135–145)
Total Bilirubin: 0.2 mg/dL (ref 0.2–1.2)
Total Protein: 6.2 g/dL (ref 6.0–8.3)

## 2022-01-24 LAB — MAGNESIUM: Magnesium: 2 mg/dL (ref 1.5–2.5)

## 2022-01-30 ENCOUNTER — Encounter: Payer: Self-pay | Admitting: Family Medicine

## 2022-02-05 ENCOUNTER — Other Ambulatory Visit: Payer: Self-pay | Admitting: Family Medicine

## 2022-02-06 DIAGNOSIS — D3611 Benign neoplasm of peripheral nerves and autonomic nervous system of face, head, and neck: Secondary | ICD-10-CM | POA: Diagnosis not present

## 2022-02-06 DIAGNOSIS — D485 Neoplasm of uncertain behavior of skin: Secondary | ICD-10-CM | POA: Diagnosis not present

## 2022-02-23 ENCOUNTER — Other Ambulatory Visit: Payer: Self-pay | Admitting: Family Medicine

## 2022-02-23 ENCOUNTER — Telehealth (INDEPENDENT_AMBULATORY_CARE_PROVIDER_SITE_OTHER): Payer: PPO | Admitting: Medical

## 2022-02-23 DIAGNOSIS — J329 Chronic sinusitis, unspecified: Secondary | ICD-10-CM | POA: Diagnosis not present

## 2022-02-23 MED ORDER — DOXYCYCLINE HYCLATE 100 MG PO TABS: 100.0000 mg | ORAL_TABLET | Freq: Two times a day (BID) | ORAL | 0 refills | Status: DC

## 2022-02-23 NOTE — Patient Instructions (Addendum)
Patient Instructions  You appear to have recurrent sinus infection. I am prescribing  doxycycline antibiotic for the infection. To help with the nasal congestion can use saline nasal spray. Can use mucinex otc.   If signs symptoms worsen or change let us know.  Hopefully will resolve like it typically has done in the past with course of doxycycline.   With spring approaching and pollen likely to fall could add on steroid over-the-counter nasal spray if needed.     Follow-up as regular scheduled with PCP or as needed.  Did go ahead and place referral to ENT  for pt chronic recurrent sinus infection hx.

## 2022-02-23 NOTE — Progress Notes (Signed)
   Subjective:    Patient ID: Jennifer Sandoval, female    DOB: 1955/08/22, 67 y.o.   MRN: [id number]  HPI Virtual Visit via Video Note  I connected with Jennifer Sandoval on 02/23/22 at  2:00 PM EST by a video enabled telemedicine application and verified that I am speaking with the correct person using two identifiers.  Location: Patient: home Provider: office   I discussed the limitations of evaluation and management by telemedicine and the availability of in person appointments. The patient expressed understanding and agreed to proceed.  History of Present Illness: Pt has had 7 days of nasal congestion that gradually got worse and now has sinus pressure with  colored mucus.  Frontal sinsu pain, more onn left side and some upper teeth discomfort. No fever or chills.   Hx of chronic recurrent  sinus infections.    Pt has hx of allergies. She also has hx of deviated septum with repair years ago late 20's. She mentions years ago falling off horse and hitting nose so she wonders if septum again deviated.     Pt typically does well with 14 days of doxy.   Blowing nose and getting colored mucus. No fever, no chills or sweats.     Observations/Objective:  General-no acute distress, pleasant, oriented. Lungs- on inspection lungs appear unlabored. Neck- no tracheal deviation or jvd on inspection. Neuro- gross motor function appears intact.   Assessment and Plan: Patient Instructions  You appear to have recurrent sinus infection. I am prescribing  doxycycline antibiotic for the infection. To help with the nasal congestion can use saline nasal spray. Can use mucinex otc.   If signs symptoms worsen or change let us know.  Hopefully will resolve like it typically has done in the past with course of doxycycline.   With spring approaching and pollen likely to fall could add on steroid over-the-counter nasal spray if needed.     Follow-up as regular scheduled with PCP or as needed.  Follow Up  Instructions:    I discussed the assessment and treatment plan with the patient. The patient was provided an opportunity to ask questions and all were answered. The patient agreed with the plan and demonstrated an understanding of the instructions.   The patient was advised to call back or seek an in-person evaluation if the symptoms worsen or if the condition fails to improve as anticipated.     Mackie Pai, PA-C    Review of Systems     Objective:   Physical Exam        Assessment & Plan:

## 2022-03-12 ENCOUNTER — Telehealth: Payer: Self-pay | Admitting: Family Medicine

## 2022-03-12 NOTE — Telephone Encounter (Signed)
Contacted [name] to schedule their annual wellness visit. Appointment made for 03/20/2022.  Sherol Dade; Care Guide [facility] Direct Dial: [phone]

## 2022-03-20 ENCOUNTER — Ambulatory Visit (HOSPITAL_BASED_OUTPATIENT_CLINIC_OR_DEPARTMENT_OTHER)
Admission: RE | Admit: 2022-03-20 | Discharge: 2022-03-20 | Disposition: A | Discharge status: Home / Self Care | Payer: PPO | Source: Ambulatory Visit | Attending: Family Medicine | Admitting: Family Medicine

## 2022-03-20 ENCOUNTER — Ambulatory Visit (INDEPENDENT_AMBULATORY_CARE_PROVIDER_SITE_OTHER): Payer: PPO | Admitting: Family Medicine

## 2022-03-20 ENCOUNTER — Encounter: Payer: Self-pay | Admitting: Family Medicine

## 2022-03-20 VITALS — BP 118/74 | HR 90 | Temp 97.5°F | Resp 16 | Ht 62.0 in | Wt 119.4 lb

## 2022-03-20 DIAGNOSIS — Z9884 Bariatric surgery status: Secondary | ICD-10-CM | POA: Diagnosis not present

## 2022-03-20 DIAGNOSIS — F418 Other specified anxiety disorders: Secondary | ICD-10-CM | POA: Diagnosis not present

## 2022-03-20 DIAGNOSIS — R1012 Left upper quadrant pain: Secondary | ICD-10-CM | POA: Insufficient documentation

## 2022-03-20 DIAGNOSIS — D509 Iron deficiency anemia, unspecified: Secondary | ICD-10-CM

## 2022-03-20 DIAGNOSIS — Z780 Asymptomatic menopausal state: Secondary | ICD-10-CM

## 2022-03-20 DIAGNOSIS — R1013 Epigastric pain: Secondary | ICD-10-CM

## 2022-03-20 DIAGNOSIS — Z1231 Encounter for screening mammogram for malignant neoplasm of breast: Secondary | ICD-10-CM | POA: Diagnosis not present

## 2022-03-20 DIAGNOSIS — E2839 Other primary ovarian failure: Secondary | ICD-10-CM | POA: Diagnosis not present

## 2022-03-20 DIAGNOSIS — Z0000 Encounter for general adult medical examination without abnormal findings: Secondary | ICD-10-CM

## 2022-03-20 LAB — COMPREHENSIVE METABOLIC PANEL
ALT: 14 U/L (ref 0–35)
AST: 21 U/L (ref 0–37)
Albumin: 3.9 g/dL (ref 3.5–5.2)
Alkaline Phosphatase: 110 U/L (ref 39–117)
BUN: 14 mg/dL (ref 6–23)
CO2: 29 mEq/L (ref 19–32)
Calcium: 9.2 mg/dL (ref 8.4–10.5)
Chloride: 104 mEq/L (ref 96–112)
Creatinine, Ser: 0.62 mg/dL (ref 0.40–1.20)
GFR: 93.42 mL/min (ref >60.00)
Glucose, Bld: 94 mg/dL (ref 70–99)
Potassium: 4.3 mEq/L (ref 3.5–5.1)
Sodium: 142 mEq/L (ref 135–145)
Total Bilirubin: 0.3 mg/dL (ref 0.2–1.2)
Total Protein: 6.2 g/dL (ref 6.0–8.3)

## 2022-03-20 LAB — CBC WITH DIFFERENTIAL/PLATELET
Basophils Absolute: 0 10*3/uL (ref 0.0–0.1)
Basophils Relative: 0.6 % (ref 0.0–3.0)
Eosinophils Absolute: 0.2 10*3/uL (ref 0.0–0.7)
Eosinophils Relative: 2.5 % (ref 0.0–5.0)
HCT: 36.5 % (ref 36.0–46.0)
Hemoglobin: 11.9 g/dL — ABNORMAL LOW (ref 12.0–15.0)
Lymphocytes Relative: 24.2 % (ref 12.0–46.0)
Lymphs Abs: 1.7 10*3/uL (ref 0.7–4.0)
MCHC: 32.7 g/dL (ref 30.0–36.0)
MCV: 87.8 fl (ref 78.0–100.0)
Monocytes Absolute: 0.4 10*3/uL (ref 0.1–1.0)
Monocytes Relative: 6.2 % (ref 3.0–12.0)
Neutro Abs: 4.8 10*3/uL (ref 1.4–7.7)
Neutrophils Relative %: 66.5 % (ref 43.0–77.0)
Platelets: 266 10*3/uL (ref 150.0–400.0)
RBC: 4.16 Mil/uL (ref 3.87–5.11)
RDW: 15.2 % (ref 11.5–15.5)
WBC: 7.2 10*3/uL (ref 4.0–10.5)

## 2022-03-20 LAB — TSH: TSH: 1.02 u[IU]/mL (ref 0.35–5.50)

## 2022-03-20 LAB — AMYLASE: Amylase: 28 U/L (ref 27–131)

## 2022-03-20 LAB — LIPASE: Lipase: 40 U/L (ref 11.0–59.0)

## 2022-03-20 MED ORDER — SERTRALINE HCL 100 MG PO TABS: 200.0000 mg | ORAL_TABLET | Freq: Every day | ORAL | 1 refills | Status: DC

## 2022-03-20 NOTE — Assessment & Plan Note (Signed)
She is on Sertraline 150 mg daily and is managing a very complex family situation. One of her daughter's lives at home with her 66 year old triplets and she also has custody of her 68 month old granddaughter of her other daughter. She has undergone counseling and has good support through her church. She does agree to consider 200 mg daily and new prescription is provided.

## 2022-03-20 NOTE — Assessment & Plan Note (Signed)
Check amylase and lipase today and a KUB given the persistence of the pain and likely will move onto CT of abd. If no findings that help explain her symptoms then she is encouraged to take a referral to gastroenterology for further consideration.

## 2022-03-20 NOTE — Assessment & Plan Note (Signed)
Has done well until she had the sepsis and pneumonia earlier this year. She now has a good appetite and her bowels are moving well but she continues to have epigastric and luq pain every day. No obvious pattern but occurs consistently everyday.

## 2022-03-20 NOTE — Assessment & Plan Note (Signed)
Supplement and monitor 

## 2022-03-20 NOTE — Assessment & Plan Note (Signed)
After her major illness in January she has had the epigastric pain and initially she had nausea frequently but that has largely resolved. She is taking Tums multiple times a day with maybe some relief temporarily

## 2022-03-20 NOTE — Patient Instructions (Addendum)
Shingrix is the new shingles shot, 2 shots over 2-6 months, confirm coverage with insurance and document, then can return here for shots with nurse appt or at pharmacy    RSV, Respiratory Syncitial Virus Vaccine, Arexvy at pharmacy    Vitacost or NOW Psyllium husk either the powder, 3 capsules    Preventive Care 65 Years and Older, Female Preventive care refers to lifestyle choices and visits with your health care provider that can promote health and wellness. Preventive care visits are also called wellness exams. What can I expect for my preventive care visit? Counseling Your health care provider may ask you questions about your: Medical history, including: Past medical problems. Family medical history. Pregnancy and menstrual history. History of falls. Current health, including: Memory and ability to understand (cognition). Emotional well-being. Home life and relationship well-being. Sexual activity and sexual health. Lifestyle, including: Alcohol, nicotine or tobacco, and drug use. Access to firearms. Diet, exercise, and sleep habits. Work and work Statistician. Sunscreen use. Safety issues such as seatbelt and bike helmet use. Physical exam Your health care provider will check your: Height and weight. These may be used to calculate your BMI (body mass index). BMI is a measurement that tells if you are at a healthy weight. Waist circumference. This measures the distance around your waistline. This measurement also tells if you are at a healthy weight and may help predict your risk of certain diseases, such as type 2 diabetes and high blood pressure. Heart rate and blood pressure. Body temperature. Skin for abnormal spots. What immunizations do I need?  Vaccines are usually given at various ages, according to a schedule. Your health care provider will recommend vaccines for you based on your age, medical history, and lifestyle or other factors, such as travel or where you  work. What tests do I need? Screening Your health care provider may recommend screening tests for certain conditions. This may include: Lipid and cholesterol levels. Hepatitis C test. Hepatitis B test. HIV (human immunodeficiency virus) test. STI (sexually transmitted infection) testing, if you are at risk. Lung cancer screening. Colorectal cancer screening. Diabetes screening. This is done by checking your blood sugar (glucose) after you have not eaten for a while (fasting). Mammogram. Talk with your health care provider about how often you should have regular mammograms. BRCA-related cancer screening. This may be done if you have a family history of breast, ovarian, tubal, or peritoneal cancers. Bone density scan. This is done to screen for osteoporosis. Talk with your health care provider about your test results, treatment options, and if necessary, the need for more tests. Follow these instructions at home: Eating and drinking  Eat a diet that includes fresh fruits and vegetables, whole grains, lean protein, and low-fat dairy products. Limit your intake of foods with high amounts of sugar, saturated fats, and salt. Take vitamin and mineral supplements as recommended by your health care provider. Do not drink alcohol if your health care provider tells you not to drink. If you drink alcohol: Limit how much you have to 0-1 drink a day. Know how much alcohol is in your drink. In the [place] one drink equals one 12 oz bottle of beer (355 mL), one 5 oz glass of wine (148 mL), or one 1 oz glass of hard liquor (44 mL). Lifestyle Brush your teeth every morning and night with fluoride toothpaste. Floss one time each day. Exercise for at least 30 minutes 5 or more days each week. Do not use any products that contain  nicotine or tobacco. These products include cigarettes, chewing tobacco, and vaping devices, such as e-cigarettes. If you need help quitting, ask your health care provider. Do not  use drugs. If you are sexually active, practice safe sex. Use a condom or other form of protection in order to prevent STIs. Take aspirin only as told by your health care provider. Make sure that you understand how much to take and what form to take. Work with your health care provider to find out whether it is safe and beneficial for you to take aspirin daily. Ask your health care provider if you need to take a cholesterol-lowering medicine (statin). Find healthy ways to manage stress, such as: Meditation, yoga, or listening to music. Journaling. Talking to a trusted person. Spending time with friends and family. Minimize exposure to UV radiation to reduce your risk of skin cancer. Safety Always wear your seat belt while driving or riding in a vehicle. Do not drive: If you have been drinking alcohol. Do not ride with someone who has been drinking. When you are tired or distracted. While texting. If you have been using any mind-altering substances or drugs. Wear a helmet and other protective equipment during sports activities. If you have firearms in your house, make sure you follow all gun safety procedures. What's next? Visit your health care provider once a year for an annual wellness visit. Ask your health care provider how often you should have your eyes and teeth checked. Stay up to date on all vaccines. This information is not intended to replace advice given to you by your health care provider. Make sure you discuss any questions you have with your health care provider. Document Revised: 06/15/2020 Document Reviewed: 06/15/2020 Elsevier Patient Education  [place].

## 2022-03-20 NOTE — Progress Notes (Signed)
Subjective:   By signing my name below, I, Jennifer Sandoval, attest that this documentation has been prepared under the direction and in the presence of Jennifer Lukes, MD., 03/20/2022.   Patient ID: Jennifer Sandoval, female    DOB: 1955-10-07, 67 y.o.   MRN: [id number]  Chief Complaint  Patient presents with   Annual Exam    Annual Exam   HPI Patient is in today for a comprehensive physical exam and follow up on chronic medical concerns.   Abdominal Pain Patient reports that she has been experiencing persistent abdominal pain since being hospitalized in 01/2022 due to pneumonia of both lower lobes. Pneumonia has resolved and she denies CP/palpitations/fever/chills/SOB/cough/ congestion. She states that the abdominal pain travels between the epigastric region and naval region but resides in the middle abdomen. She rates the pain as 3/10 but states that at times it can be 6-7/10. She denies nausea/vomiting but states that she did feel nauseous upon onset of the pain. She is taking TUMS as needed and states that this provides temporary relief. Bowel movements are normal and she denies constipation/diarrhea/blood in the stool. She continues taking probiotics daily but denies taking fiber supplements. There is no history of a colonoscopy, but she reports that she completed one several years ago which revealed diverticulosis, but no polyps. Family history is that her maternal grandmother has colon cancer. Additionally, she reports having gastric bypass surgery in the past.  Anxiety/Stress Patient complains of anxiety/stress due to her current living condition. She is taking care of her eldest daughter's triplets and struggles to find time to take care of herself. She continues taking Sertraline 150 mg daily but is interested in increasing this to 200 mg. She started counseling in 2018 but states that she recently stopped this.  Family History Her 25 yo daughter, Jennifer Sandoval, has been diagnosed with anxiety,  major depressive disorder, and secondary psychosis.   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   Dry eyes, bilateral 01/30/2016   Dyspepsia 04/19/2010   FRACTURE, NOSE 10/22/2007   History of gestational diabetes 01/30/2016   Insomnia 01/23/2012   Leiomyoma of uterus, unspecified 02/15/2010   Lumbar compression fracture ([place])    nonsurgical   OSTEOARTHRITIS 10/24/2006   PERIMENOPAUSAL STATUS 03/16/2010   Post-menopause 03/16/2010   Qualifier: Diagnosis of  By: Charlett Blake MD, Sal Spratley     Preventative health care 08/31/2012   SHOULDER PAIN, BILATERAL 10/10/2007    Past Surgical History:  Procedure Laterality Date   ABDOMINAL HYSTERECTOMY  01/02/2007   partial, ovaries left in place, for painful, heavy menstrrual bleeding secondary to anemia and fibroids   APPENDECTOMY     CESAREAN SECTION     X 3   CHOLECYSTECTOMY     GASTRIC BYPASS     KNEE SURGERY Right    2018 or 2019   NASAL SINUS SURGERY     SEPTOPLASTY     skin biopsy     L hip BCC, facial lesions were benign   TUBAL LIGATION     ventral herniorrhaphy     X 2    Family History  Problem  Relation Age of Onset   Cholelithiasis Mother    Other Mother        Urinary incontince/ bladder prolapse/ h/o smoking   Cancer Mother        laryngeal carcinoma   Allergies Mother    Arthritis Mother        s/p knee and shoulder replacement   Dementia Mother        lewy body   Other Father        Renal failure   Hypertension Father    Coronary artery disease Father        s/p bypass   Aortic aneurysm Father    Hyperlipidemia Father    Heart disease Father 45       quadruple bypass   Spina bifida Brother         with stunt/ self cath   Seizures Brother        disorders   ADD / ADHD Daughter    ADD / ADHD Daughter    Depression Daughter        major depressive, anxiety, secondary psychosis   Cancer Maternal Grandmother        colon/ smoker   Cancer Maternal Grandfather        lung   Cancer Paternal Grandmother        Breast   Heart disease Paternal Grandfather    Stroke Paternal Grandfather     Social History   Socioeconomic History   Marital status: Married    Spouse name: Not on file   Number of children: Not on file   Years of education: Not on file   Highest education level: Not on file  Occupational History   Not on file  Tobacco Use   Smoking status: Never   Smokeless tobacco: Never  Vaping Use   Vaping Use: Never used  Substance and Sexual Activity   Alcohol use: Yes    Comment: special occasion   Drug use: No   Sexual activity: Yes    Partners: Male  Other Topics Concern   Not on file  Social History Narrative   Not on file   Social Determinants of Health   Financial Resource Strain: Not on file  Food Insecurity: No Food Insecurity (01/15/2022)   Hunger Vital Sign    Worried About Running Out of Food in the Last Year: Never true    Ran Out of Food in the Last Year: Never true  Transportation Needs: No Transportation Needs (01/15/2022)   PRAPARE - Hydrologist (Medical): No    Lack of Transportation (Non-Medical): No  Physical Activity: Not on file  Stress: Not on file  Social Connections: Not on file  Intimate Partner Violence: Not At Risk (01/07/2022)   Humiliation, Afraid, Rape, and Kick questionnaire    Fear of Current or Ex-Partner: No    Emotionally Abused: No    Physically Abused: No    Sexually Abused: No    Outpatient Medications Prior to Visit  Medication Sig Dispense Refill   Multiple Vitamin (MULTIVITAMIN WITH MINERALS) TABS tablet Take 1 tablet by mouth daily. 100 tablet 0   albuterol (VENTOLIN HFA) 108 (90  Base) MCG/ACT inhaler Inhale 2 puffs into the lungs every 6 (six) hours as needed for wheezing or shortness of breath. 8 g 2   azelastine (ASTELIN) 0.1 % nasal spray Place 1 spray into both nostrils 2 (two) times daily. Use in each nostril as directed 30 mL 12  diphenhydrAMINE (BENADRYL) 25 mg capsule Take 1 capsule (25 mg total) by mouth every 8 (eight) hours as needed for itching or allergies. 15 capsule 0   doxycycline (VIBRA-TABS) 100 MG tablet Take 1 tablet (100 mg total) by mouth 2 (two) times daily. 28 tablet 0   fluticasone (FLONASE) 50 MCG/ACT nasal spray Place 2 sprays into both nostrils daily. 16 g 6   guaiFENesin-dextromethorphan (ROBITUSSIN DM) 100-10 MG/5ML syrup Take 5 mLs by mouth every 4 (four) hours as needed for cough. 118 mL 0   levocetirizine (XYZAL) 5 MG tablet Take 1 tablet (5 mg total) by mouth every evening. 30 tablet 5   sertraline (ZOLOFT) 100 MG tablet TAKE 1 AND 1/2 TABLETS BY MOUTH DAILY 135 tablet 0   No facility-administered medications prior to visit.    Allergies  Allergen Reactions   Itraconazole     REACTION: Rash   Levaquin [Levofloxacin In D5w]     Joint pain    Review of Systems  Constitutional:  Negative for chills and fever.  HENT:  Negative for congestion.   Respiratory:  Negative for cough and shortness of breath.   Cardiovascular:  Negative for chest pain and palpitations.  Gastrointestinal:  Positive for abdominal pain. Negative for blood in stool, constipation, diarrhea, nausea and vomiting.  Skin:           Neurological:  Negative for headaches.  Psychiatric/Behavioral:  The patient is nervous/anxious.        Objective:    Physical Exam Constitutional:      General: She is not in acute distress.    Appearance: Normal appearance. She is normal weight. She is not ill-appearing.  HENT:     Head: Normocephalic and atraumatic.     Right Ear: Tympanic membrane, ear canal and external ear normal.     Left Ear: Tympanic membrane, ear  canal and external ear normal.     Nose: Nose normal.     Mouth/Throat:     Mouth: Mucous membranes are moist.     Pharynx: Oropharynx is clear.  Eyes:     General:        Right eye: No discharge.        Left eye: No discharge.     Extraocular Movements: Extraocular movements intact.     Right eye: No nystagmus.     Left eye: No nystagmus.     Pupils: Pupils are equal, round, and reactive to light.  Neck:     Vascular: No carotid bruit.  Cardiovascular:     Rate and Rhythm: Normal rate and regular rhythm.     Pulses: Normal pulses.     Heart sounds: Normal heart sounds. No murmur heard.    No gallop.  Pulmonary:     Effort: Pulmonary effort is normal. No respiratory distress.     Breath sounds: Normal breath sounds. No wheezing or rales.  Abdominal:     General: Bowel sounds are normal.     Palpations: Abdomen is soft.     Tenderness: There is no abdominal tenderness. There is no guarding.     Comments: Naval region is tender upon applying pressure.  Musculoskeletal:        General: Normal range of motion.     Cervical back: Normal range of motion.     Right lower leg: No edema.     Left lower leg: No edema.     Comments: Muscle strength 5/5 on upper and lower extremities.   Lymphadenopathy:  Cervical: No cervical adenopathy.  Skin:    General: Skin is warm and dry.  Neurological:     Mental Status: She is alert and oriented to person, place, and time.     Sensory: Sensation is intact.     Motor: Motor function is intact.     Coordination: Coordination is intact.     Deep Tendon Reflexes:     Reflex Scores:      Patellar reflexes are 2+ on the right side and 2+ on the left side. Psychiatric:        Mood and Affect: Mood normal.        Behavior: Behavior normal.        Judgment: Judgment normal.     BP 118/74 (BP Location: Right Arm, Patient Position: Sitting, Cuff Size: Normal)   Pulse 90   Temp (!) 97.5 F (36.4 C) (Oral)   Resp 16   Ht 5\' 2"  (1.575 m)    Wt 119 lb 6.4 oz (54.2 kg)   SpO2 97%   BMI 21.84 kg/m  Wt Readings from Last 3 Encounters:  03/20/22 119 lb 6.4 oz (54.2 kg)  01/23/22 119 lb (54 kg)  01/07/22 127 lb 10.3 oz (57.9 kg)    Diabetic Foot Exam - Simple   No data filed    Lab Results  Component Value Date   WBC 12.4 (H) 01/23/2022   HGB 10.9 (L) 01/23/2022   HCT 33.4 (L) 01/23/2022   PLT 835.0 (H) 01/23/2022   GLUCOSE 81 01/23/2022   CHOL 132 01/30/2016   TRIG 95.0 01/30/2016   HDL 67.70 01/30/2016   LDLCALC 45 01/30/2016   ALT 14 01/23/2022   AST 15 01/23/2022   NA 141 01/23/2022   K 4.8 01/23/2022   CL 103 01/23/2022   CREATININE 0.58 01/23/2022   BUN 17 01/23/2022   CO2 28 01/23/2022   TSH 1.33 01/30/2016   INR 1.4 (H) 01/07/2022   HGBA1C 6.0 10/10/2011    Lab Results  Component Value Date   TSH 1.33 01/30/2016   Lab Results  Component Value Date   WBC 12.4 (H) 01/23/2022   HGB 10.9 (L) 01/23/2022   HCT 33.4 (L) 01/23/2022   MCV 92.6 01/23/2022   PLT 835.0 (H) 01/23/2022   Lab Results  Component Value Date   NA 141 01/23/2022   K 4.8 01/23/2022   CO2 28 01/23/2022   GLUCOSE 81 01/23/2022   BUN 17 01/23/2022   CREATININE 0.58 01/23/2022   BILITOT 0.2 01/23/2022   ALKPHOS 84 01/23/2022   AST 15 01/23/2022   ALT 14 01/23/2022   PROT 6.2 01/23/2022   ALBUMIN 3.5 01/23/2022   CALCIUM 9.0 01/23/2022   ANIONGAP 9 01/13/2022   GFR 95.04 01/23/2022   Lab Results  Component Value Date   CHOL 132 01/30/2016   Lab Results  Component Value Date   HDL 67.70 01/30/2016   Lab Results  Component Value Date   LDLCALC 45 01/30/2016   Lab Results  Component Value Date   TRIG 95.0 01/30/2016   Lab Results  Component Value Date   CHOLHDL 2 01/30/2016   Lab Results  Component Value Date   HGBA1C 6.0 10/10/2011      Assessment & Plan:  Colonoscopy: No history of colonoscopy. Encouraged patient to complete colonoscopy and provided them with FIT test.  DEXA: Last completed on  12/02/2013. Patient is considered OSTEOPENIC. Overdue since 2021. Ordered placed today.  Mammogram: Last completed on 01/30/2016 with no  mammographic evidence of malignancy. Overdue since 2020. Order placed today.  Pap Smear: No history of pap smear. Pap smear does not need to be completed due to history of total hysterectomy.  Advanced Directives: Encouraged patient to complete advanced care planning documents.  Healthy Lifestyle: Encouraged 6-8 hours of sleep, heart healthy diet, 60-80 oz of non-alcohol/non-caffeinated fluids, and minimum of 4000 steps daily.  Immunizations: Encouraged annual COVID-19/Flu vaccinations as well as RSV and Shingles vaccinations.  Labs: Routine blood work ordered today will also check pancreatic enzymes.  Abdominal Pain: Abdominal x-ray ordered.   Anxiety/Stress: Patient has discontinued counseling. Sertraline 150 mg daily increased to 200 mg daily.   Problem List Items Addressed This Visit     Anemia    Supplement and monitor       Relevant Orders   Iron, TIBC and Ferritin Panel   CBC with Differential/Platelet   TSH   Depression with anxiety    She is on Sertraline 150 mg daily and is managing a very complex family situation. One of her daughter's lives at home with her 60 year old triplets and she also has custody of her 86 month old granddaughter of her other daughter. She has undergone counseling and has good support through her church. She does agree to consider 200 mg daily and new prescription is provided.      Relevant Medications   sertraline (ZOLOFT) 100 MG tablet   Epigastric pain - Primary    After her major illness in January she has had the epigastric pain and initially she had nausea frequently but that has largely resolved. She is taking Tums multiple times a day with maybe some relief temporarily      Relevant Orders   DG Abd 1 View   TSH   H/O gastric bypass    Has done well until she had the sepsis and pneumonia earlier this  year. She now has a good appetite and her bowels are moving well but she continues to have epigastric and luq pain every day. No obvious pattern but occurs consistently everyday.       Relevant Orders   Comprehensive metabolic panel   TSH   LUQ pain    Check amylase and lipase today and a KUB given the persistence of the pain and likely will move onto CT of abd. If no findings that help explain her symptoms then she is encouraged to take a referral to gastroenterology for further consideration.       Relevant Orders   DG Abd 1 View   TSH   Amylase   Lipase   Preventative health care    Patient encouraged to maintain heart healthy diet, regular exercise, adequate sleep. Consider daily probiotics. Take medications as prescribed. Labs ordered and reviewed   Patient hesitant to proceed with preventative work due to her hectic schedule. She declined a colonoscopy because she had such a difficult time recovering from the last one she had over a decade ago but she does agree to take an ifit kit with her today and it is given. She is resistant to [facility] and dexa due to time but after discussion she agrees to return in May for testing. She had a hysterectomy so she does not need a pap      Other Visit Diagnoses     Encounter for screening mammogram for malignant neoplasm of breast       Relevant Orders   MM 3D SCREENING MAMMOGRAM BILATERAL BREAST   Estrogen deficiency  Relevant Orders   DG Bone Density   Post-menopausal       Relevant Orders   DG Bone Density      Meds ordered this encounter  Medications   sertraline (ZOLOFT) 100 MG tablet    Sig: Take 2 tablets (200 mg total) by mouth daily.    Dispense:  180 tablet    Refill:  1   I, Penni Homans, MD, personally preformed the services described in this documentation.  All medical record entries made by the scribe were at my direction and in my presence.  I have reviewed the chart and discharge instructions (if applicable) and  agree that the record reflects my personal performance and is accurate and complete. 03/20/2022  I,Mohammed Iqbal,acting as a scribe for Penni Homans, MD.,have documented all relevant documentation on the behalf of Penni Homans, MD,as directed by  Penni Homans, MD while in the presence of Penni Homans, MD.  Penni Homans, MD

## 2022-03-20 NOTE — Assessment & Plan Note (Signed)
Patient encouraged to maintain heart healthy diet, regular exercise, adequate sleep. Consider daily probiotics. Take medications as prescribed. Labs ordered and reviewed   Patient hesitant to proceed with preventative work due to her hectic schedule. She declined a colonoscopy because she had such a difficult time recovering from the last one she had over a decade ago but she does agree to take an ifit kit with her today and it is given. She is resistant to [facility] and dexa due to time but after discussion she agrees to return in May for testing. She had a hysterectomy so she does not need a pap

## 2022-03-21 LAB — IRON,TIBC AND FERRITIN PANEL
%SAT: 10 % (calc) — ABNORMAL LOW (ref 16–45)
Ferritin: 10 ng/mL — ABNORMAL LOW (ref 16–288)
Iron: 31 ug/dL — ABNORMAL LOW (ref 45–160)
TIBC: 322 mcg/dL (calc) (ref 250–450)

## 2022-03-22 ENCOUNTER — Other Ambulatory Visit: Payer: Self-pay

## 2022-03-22 ENCOUNTER — Other Ambulatory Visit (INDEPENDENT_AMBULATORY_CARE_PROVIDER_SITE_OTHER): Payer: PPO

## 2022-03-22 DIAGNOSIS — Z1211 Encounter for screening for malignant neoplasm of colon: Secondary | ICD-10-CM | POA: Diagnosis not present

## 2022-03-22 DIAGNOSIS — J324 Chronic pansinusitis: Secondary | ICD-10-CM

## 2022-03-22 DIAGNOSIS — R1012 Left upper quadrant pain: Secondary | ICD-10-CM

## 2022-03-22 DIAGNOSIS — R1013 Epigastric pain: Secondary | ICD-10-CM

## 2022-03-22 DIAGNOSIS — Z9884 Bariatric surgery status: Secondary | ICD-10-CM

## 2022-03-22 MED ORDER — HEMOCYTE-F 324-1 MG PO TABS: 1.0000 mg | ORAL_TABLET | Freq: Every day | ORAL | 2 refills | Status: DC

## 2022-03-22 NOTE — Addendum Note (Signed)
Addended by: Manuela Schwartz on: 03/22/2022 11:13 AM   Modules accepted: Orders

## 2022-03-23 ENCOUNTER — Other Ambulatory Visit: Payer: Self-pay

## 2022-03-23 ENCOUNTER — Encounter: Payer: Self-pay | Admitting: Family Medicine

## 2022-03-23 ENCOUNTER — Telehealth: Payer: Self-pay

## 2022-03-23 DIAGNOSIS — R109 Unspecified abdominal pain: Secondary | ICD-10-CM

## 2022-03-23 DIAGNOSIS — K921 Melena: Secondary | ICD-10-CM

## 2022-03-23 DIAGNOSIS — Z9884 Bariatric surgery status: Secondary | ICD-10-CM

## 2022-03-23 LAB — FECAL OCCULT BLOOD, IMMUNOCHEMICAL: Fecal Occult Bld: POSITIVE — AB

## 2022-03-23 NOTE — Telephone Encounter (Signed)
CRITICAL VALUE STICKER  CRITICAL VALUE: Positive IFOB   RECEIVER (on-site recipient of call): Janett Billow CMA   DATE & TIME NOTIFIED: 03/22/202 2:50PM  MESSENGER (representative from lab): [facility]  MD NOTIFIED: Dr. Larose Kells   TIME OF NOTIFICATION : 3:02PM  RESPONSE:

## 2022-03-23 NOTE — Telephone Encounter (Signed)
Okay to wait for PCP

## 2022-03-25 ENCOUNTER — Other Ambulatory Visit: Payer: Self-pay | Admitting: Family Medicine

## 2022-03-25 DIAGNOSIS — D509 Iron deficiency anemia, unspecified: Secondary | ICD-10-CM

## 2022-03-25 DIAGNOSIS — Z9884 Bariatric surgery status: Secondary | ICD-10-CM

## 2022-03-25 DIAGNOSIS — R195 Other fecal abnormalities: Secondary | ICD-10-CM

## 2022-03-26 ENCOUNTER — Ambulatory Visit (INDEPENDENT_AMBULATORY_CARE_PROVIDER_SITE_OTHER): Payer: PPO | Admitting: *Deleted

## 2022-03-26 DIAGNOSIS — Z0000 Encounter for general adult medical examination without abnormal findings: Secondary | ICD-10-CM | POA: Diagnosis not present

## 2022-03-26 NOTE — Progress Notes (Signed)
Subjective:  Pt completed ADLs, Fall risk, and SDOH during e-check in on 03/25/22.  Answers verified with pt.    Jennifer Sandoval is a 67 y.o. female who presents for an Initial Medicare Annual Wellness Visit.  I connected with  [name] on 03/26/22 by a audio enabled telemedicine application and verified that I am speaking with the correct person using two identifiers.  Patient Location: Home  Provider Location: Office/Clinic  I discussed the limitations of evaluation and management by telemedicine. The patient expressed understanding and agreed to proceed.   Review of Systems     Cardiac Risk Factors include: advanced age (>54men, >67 women)     Objective:    There were no vitals filed for this visit. There is no height or weight on file to calculate BMI.     03/26/2022   10:22 AM 01/07/2022    8:40 PM 01/07/2022    9:53 AM 01/04/2022    1:32 AM 11/01/2016    4:18 PM  Advanced Directives  Does Patient Have a Medical Advance Directive? Yes  No No No  Type of Advance Directive Living will;Healthcare Power of Attorney      [place] in Chart? No - copy requested      Would patient like information on creating a medical advance directive?  No - Patient declined       Current Medications (verified) Outpatient Encounter Medications as of 03/26/2022  Medication Sig   Ferrous Fumarate-Folic Acid (HEMOCYTE-F) 324-1 MG TABS Take 1 mg by mouth daily.   Multiple Vitamin (MULTIVITAMIN WITH MINERALS) TABS tablet Take 1 tablet by mouth daily.   sertraline (ZOLOFT) 100 MG tablet Take 2 tablets (200 mg total) by mouth daily.   No facility-administered encounter medications on file as of 03/26/2022.    Allergies (verified) Itraconazole and Levaquin [levofloxacin in d5w]   History: Past Medical History:  Diagnosis Date   Acute sinusitis 07/31/2006   ALLERGIC RHINITIS 08/15/2006   Allergy    rhinitis   Anemia    iron deficeincy   ANEMIA-NOS 10/24/2006    Anxiety    When kids became teenagers   BASAL CELL CARCINOMA SKIN LOWER LIMB INCL HIP 02/15/2010   BCC (basal cell carcinoma of skin) 07/10/2011   h/o   Blood transfusion without reported diagnosis    During hysterectomy   Depression    DEPRESSION 10/24/2006   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   Dry eyes, bilateral 01/30/2016   Dyspepsia 04/19/2010   FRACTURE, NOSE 10/22/2007   History of gestational diabetes 01/30/2016   Insomnia 01/23/2012   Leiomyoma of uterus, unspecified 02/15/2010   Lumbar compression fracture ([place])    nonsurgical   OSTEOARTHRITIS 10/24/2006   PERIMENOPAUSAL STATUS 03/16/2010   Post-menopause 03/16/2010   Qualifier: Diagnosis of  By: Charlett Blake MD, Stacey     Preventative health care 08/31/2012   SHOULDER PAIN, BILATERAL 10/10/2007   Past Surgical History:  Procedure Laterality Date   ABDOMINAL HYSTERECTOMY  01/02/2007   partial, ovaries left in place, for painful, heavy menstrrual bleeding secondary to anemia and fibroids   APPENDECTOMY     CESAREAN SECTION     X 3   CHOLECYSTECTOMY     GASTRIC BYPASS     HERNIA REPAIR     KNEE SURGERY Right    2018 or 2019   NASAL SINUS SURGERY     SEPTOPLASTY     skin biopsy     L hip BCC, facial lesions  were benign   TUBAL LIGATION     ventral herniorrhaphy     X 2   Family History  Problem Relation Age of Onset   Cholelithiasis Mother    Other Mother        Urinary incontince/ bladder prolapse/ h/o smoking   Cancer Mother        laryngeal carcinoma   Allergies Mother    Arthritis Mother        s/p knee and shoulder replacement   Dementia Mother        lewy body   Other Father        Renal failure   Hypertension Father    Coronary artery disease Father        s/p bypass   Aortic aneurysm Father    Hyperlipidemia Father    Heart disease Father 31       quadruple bypass   Spina bifida Brother        with stunt/ self cath   Seizures Brother        disorders   ADD / ADHD Daughter     ADD / ADHD Daughter    Depression Daughter        major depressive, anxiety, secondary psychosis   Cancer Maternal Grandmother        colon/ smoker   Cancer Maternal Grandfather        lung   Cancer Paternal Grandmother        Breast   Heart disease Paternal Grandfather    Stroke Paternal Grandfather    Social History   Socioeconomic History   Marital status: Married    Spouse name: Not on file   Number of children: Not on file   Years of education: Not on file   Highest education level: Not on file  Occupational History   Not on file  Tobacco Use   Smoking status: Never   Smokeless tobacco: Never  Vaping Use   Vaping Use: Never used  Substance and Sexual Activity   Alcohol use: Yes    Comment: special occasion   Drug use: No   Sexual activity: Yes    Partners: Male  Other Topics Concern   Not on file  Social History Narrative   Not on file   Social Determinants of Health   Financial Resource Strain: Low Risk  (03/25/2022)   Overall Financial Resource Strain (CARDIA)    Difficulty of Paying Living Expenses: Not very hard  Food Insecurity: No Food Insecurity (03/25/2022)   Hunger Vital Sign    Worried About Running Out of Food in the Last Year: Never true    Ran Out of Food in the Last Year: Never true  Transportation Needs: No Transportation Needs (03/25/2022)   PRAPARE - Hydrologist (Medical): No    Lack of Transportation (Non-Medical): No  Physical Activity: Insufficiently Active (03/25/2022)   Exercise Vital Sign    Days of Exercise per Week: 4 days    Minutes of Exercise per Session: 20 min  Stress: No Stress Concern Present (03/25/2022)   [place]    Feeling of Stress : Only a little  Social Connections: Socially Integrated (03/25/2022)   Social Connection and Isolation Panel [NHANES]    Frequency of Communication with Friends and Family: More than three times a  week    Frequency of Social Gatherings with Friends and Family: Once a week    Attends  Religious Services: More than 4 times per year    Active Member of Clubs or Organizations: Yes    Attends Archivist Meetings: More than 4 times per year    Marital Status: Married    Tobacco Counseling Counseling given: Not Answered   Clinical Intake:  Pre-visit preparation completed: Yes  Pain : No/denies pain  Diabetes: No  How often do you need to have someone help you when you read instructions, pamphlets, or other written materials from your doctor or pharmacy?: 1 - Never   Activities of Daily Living    03/25/2022    9:15 PM 01/07/2022    8:40 PM  In your present state of health, do you have any difficulty performing the following activities:  Hearing? 1 0  Comment very slight hearing loss   Vision? 0 0  Difficulty concentrating or making decisions? 0 0  Walking or climbing stairs? 0 0  Dressing or bathing? 0 1  Comment  r/t sickness  Doing errands, shopping? 0 0  Preparing Food and eating ? N   Using the Toilet? N   In the past six months, have you accidently leaked urine? N   Do you have problems with loss of bowel control? N   Managing your Medications? N   Managing your Finances? N   Housekeeping or managing your Housekeeping? N     Patient Care Team: Mosie Lukes, MD as PCP - General (Family Medicine)  Indicate any recent Medical Services you may have received from other than [facility] providers in the past year (date may be approximate).     Assessment:   This is a routine wellness examination for [place].  Hearing/Vision screen No results found.  Dietary issues and exercise activities discussed: Current Exercise Habits: Home exercise routine, Type of exercise: walking, Time (Minutes): 30, Frequency (Times/Week): 3, Weekly Exercise (Minutes/Week): 90, Intensity: Mild, Exercise limited by: None identified;Other - see comments (takes care of 4 kids under the  age of 54)   Goals Addressed   None    Depression Screen    03/26/2022   10:30 AM 03/20/2022    9:32 AM  PHQ 2/9 Scores  PHQ - 2 Score 0 2  PHQ- 9 Score  2    Fall Risk    03/25/2022    9:15 PM 03/20/2022    9:32 AM 05/27/2020   11:35 AM  Fall Risk   Falls in the past year? 1  0  Comment tripped over children's toy    Number falls in past yr: 1 1 0  Injury with Fall? 1 1 0  Risk for fall due to : History of fall(s)    Follow up Falls evaluation completed Falls evaluation completed     [place]:  Any stairs in or around the home? Yes  If so, are there any without handrails? No  Home free of loose throw rugs in walkways, pet beds, electrical cords, etc? No small children in the home Adequate lighting in your home to reduce risk of falls? Yes   ASSISTIVE DEVICES UTILIZED TO PREVENT FALLS:  Life alert? No  Use of a cane, walker or w/c? No  Grab bars in the bathroom?  Yes, in shower Shower chair or bench in shower?  Built in seat Elevated toilet seat or a handicapped toilet? No   TIMED UP AND GO:  Was the test performed?  No, audio visit .    Cognitive Function:  03/26/2022   10:39 AM  6CIT Screen  What Year? 0 points  What month? 0 points  What time? 0 points  Count back from 20 0 points  Months in reverse 0 points  Repeat phrase 0 points  Total Score 0 points    Immunizations Immunization History  Administered Date(s) Administered   Fluad Quad(high Dose 65+) 01/23/2022   Influenza Whole 10/24/2006, 10/10/2007   Influenza,inj,Quad PF,6+ Mos 10/15/2012, 09/25/2013, 11/02/2014, 09/24/2017, 11/09/2018   PNEUMOCOCCAL CONJUGATE-20 01/23/2022   Td 10/05/1997, 01/30/2016   Tdap 09/24/2017   Zoster, Live 08/29/2012    TDAP status: Up to date  Flu Vaccine status: Up to date  Pneumococcal vaccine status: Up to date  Covid-19 vaccine status: Declined, Education has been provided regarding the importance of this  vaccine but patient still declined. Advised may receive this vaccine at local pharmacy or Health Dept.or vaccine clinic. Aware to provide a copy of the vaccination record if obtained from local pharmacy or Health Dept. Verbalized acceptance and understanding.  Qualifies for Shingles Vaccine? Yes   Zostavax completed Yes   Shingrix Completed?: No.    Education has been provided regarding the importance of this vaccine. Patient has been advised to call insurance company to determine out of pocket expense if they have not yet received this vaccine. Advised may also receive vaccine at local pharmacy or Health Dept. Verbalized acceptance and understanding.  Screening Tests Health Maintenance  Topic Date Due   Medicare Annual Wellness (AWV)  Never done   Hepatitis C Screening  Never done   Zoster Vaccines- Shingrix (1 of 2) Never done   PAP SMEAR-Modifier  Never done   MAMMOGRAM  01/29/2018   COLONOSCOPY (Pts 45-66yrs Insurance coverage will need to be confirmed)  05/09/2020   COVID-19 Vaccine (1) 07/27/2022 (Originally 10/01/1960)   DTaP/Tdap/Td (4 - Td or Tdap) 09/25/2027   Pneumonia Vaccine 19+ Years old  Completed   INFLUENZA VACCINE  Completed   DEXA SCAN  Completed   HIV Screening  Completed   HPV VACCINES  Aged Out    Health Maintenance  Health Maintenance Due  Topic Date Due   Medicare Annual Wellness (AWV)  Never done   Hepatitis C Screening  Never done   Zoster Vaccines- Shingrix (1 of 2) Never done   PAP SMEAR-Modifier  Never done   MAMMOGRAM  01/29/2018   COLONOSCOPY (Pts 45-86yrs Insurance coverage will need to be confirmed)  05/09/2020    Colorectal cancer screening: Referral to GI placed 03/22/22. Pt aware the office will call re: appt.  Mammogram status: Completed 01/30/16. Repeat every year. Scheduled for 04/11/22  Bone Density status: Ordered 03/20/22. Pt provided with contact info and advised to call to schedule appt. Scheduled for 04/11/22.  Lung Cancer Screening:  (Low Dose CT Chest recommended if Age 69-80 years, 30 pack-year currently smoking OR have quit w/in 15years.) does not qualify.   Additional Screening:  Hepatitis C Screening: does qualify; Completed N/a  Vision Screening: Recommended annual ophthalmology exams for early detection of glaucoma and other disorders of the eye. Is the patient up to date with their annual eye exam?  Yes  Who is the provider or what is the name of the office in which the patient attends annual eye exams? In the process of getting new eye doctor for this year If pt is not established with a provider, would they like to be referred to a provider to establish care? No .   Dental Screening: Recommended annual dental  exams for proper oral hygiene  Community Resource Referral / Chronic Care Management: CRR required this visit?  No   CCM required this visit?  No      Plan:     I have personally reviewed and noted the following in the patient's chart:   Medical and social history Use of alcohol, tobacco or illicit drugs  Current medications and supplements including opioid prescriptions. Patient is not currently taking opioid prescriptions. Functional ability and status Nutritional status Physical activity Advanced directives List of other physicians Hospitalizations, surgeries, and ER visits in previous 12 months Vitals Screenings to include cognitive, depression, and falls Referrals and appointments  In addition, I have reviewed and discussed with patient certain preventive protocols, quality metrics, and best practice recommendations. A written personalized care plan for preventive services as well as general preventive health recommendations were provided to patient.   Due to this being a telephonic visit, the after visit summary with patients personalized plan was offered to patient via mail or my-chart. Patient would like to access on my-chart.  Beatris Ship, [place]   03/26/2022   Nurse Notes:  None

## 2022-03-26 NOTE — Patient Instructions (Signed)
Jennifer Sandoval , Thank you for taking time to come for your Medicare Wellness Visit. I appreciate your ongoing commitment to your health goals. Please review the following plan we discussed and let me know if I can assist you in the future.   These are the goals we discussed:  Goals   None     This is a list of the screening recommended for you and due dates:  Health Maintenance  Topic Date Due   Hepatitis C Screening: USPSTF Recommendation to screen - Ages 72-79 yo.  Never done   Zoster (Shingles) Vaccine (1 of 2) Never done   Pap Smear  Never done   Mammogram  01/29/2018   Colon Cancer Screening  05/09/2020   COVID-19 Vaccine (1) 07/27/2022*   Medicare Annual Wellness Visit  03/26/2023   DTaP/Tdap/Td vaccine (4 - Td or Tdap) 09/25/2027   Pneumonia Vaccine  Completed   Flu Shot  Completed   DEXA scan (bone density measurement)  Completed   HIV Screening  Completed   HPV Vaccine  Aged Out  *Topic was postponed. The date shown is not the original due date.     Next appointment: Follow up in one year for your annual wellness visit.   Preventive Care 68 Years and Older, Female Preventive care refers to lifestyle choices and visits with your health care provider that can promote health and wellness. What does preventive care include? A yearly physical exam. This is also called an annual well check. Dental exams once or twice a year. Routine eye exams. Ask your health care provider how often you should have your eyes checked. Personal lifestyle choices, including: Daily care of your teeth and gums. Regular physical activity. Eating a healthy diet. Avoiding tobacco and drug use. Limiting alcohol use. Practicing safe sex. Taking low-dose aspirin every day. Taking vitamin and mineral supplements as recommended by your health care provider. What happens during an annual well check? The services and screenings done by your health care provider during your annual well check will  depend on your age, overall health, lifestyle risk factors, and family history of disease. Counseling  Your health care provider may ask you questions about your: Alcohol use. Tobacco use. Drug use. Emotional well-being. Home and relationship well-being. Sexual activity. Eating habits. History of falls. Memory and ability to understand (cognition). Work and work Statistician. Reproductive health. Screening  You may have the following tests or measurements: Height, weight, and BMI. Blood pressure. Lipid and cholesterol levels. These may be checked every 5 years, or more frequently if you are over 12 years old. Skin check. Lung cancer screening. You may have this screening every year starting at age 81 if you have a 30-pack-year history of smoking and currently smoke or have quit within the past 15 years. Fecal occult blood test (FOBT) of the stool. You may have this test every year starting at age 35. Flexible sigmoidoscopy or colonoscopy. You may have a sigmoidoscopy every 5 years or a colonoscopy every 10 years starting at age 56. Hepatitis C blood test. Hepatitis B blood test. Sexually transmitted disease (STD) testing. Diabetes screening. This is done by checking your blood sugar (glucose) after you have not eaten for a while (fasting). You may have this done every 1-3 years. Bone density scan. This is done to screen for osteoporosis. You may have this done starting at age 13. Mammogram. This may be done every 1-2 years. Talk to your health care provider about how often you should have  regular mammograms. Talk with your health care provider about your test results, treatment options, and if necessary, the need for more tests. Vaccines  Your health care provider may recommend certain vaccines, such as: Influenza vaccine. This is recommended every year. Tetanus, diphtheria, and acellular pertussis (Tdap, Td) vaccine. You may need a Td booster every 10 years. Zoster vaccine. You may  need this after age 83. Pneumococcal 13-valent conjugate (PCV13) vaccine. One dose is recommended after age 56. Pneumococcal polysaccharide (PPSV23) vaccine. One dose is recommended after age 13. Talk to your health care provider about which screenings and vaccines you need and how often you need them. This information is not intended to replace advice given to you by your health care provider. Make sure you discuss any questions you have with your health care provider. Document Released: 01/14/2015 Document Revised: 09/07/2015 Document Reviewed: 10/19/2014 Elsevier Interactive Patient Education  2017 [place] Prevention in the Home Falls can cause injuries. They can happen to people of all ages. There are many things you can do to make your home safe and to help prevent falls. What can I do on the outside of my home? Regularly fix the edges of walkways and driveways and fix any cracks. Remove anything that might make you trip as you walk through a door, such as a raised step or threshold. Trim any bushes or trees on the path to your home. Use bright outdoor lighting. Clear any walking paths of anything that might make someone trip, such as rocks or tools. Regularly check to see if handrails are loose or broken. Make sure that both sides of any steps have handrails. Any raised decks and porches should have guardrails on the edges. Have any leaves, snow, or ice cleared regularly. Use sand or salt on walking paths during winter. Clean up any spills in your garage right away. This includes oil or grease spills. What can I do in the bathroom? Use night lights. Install grab bars by the toilet and in the tub and shower. Do not use towel bars as grab bars. Use non-skid mats or decals in the tub or shower. If you need to sit down in the shower, use a plastic, non-slip stool. Keep the floor dry. Clean up any water that spills on the floor as soon as it happens. Remove soap buildup in  the tub or shower regularly. Attach bath mats securely with double-sided non-slip rug tape. Do not have throw rugs and other things on the floor that can make you trip. What can I do in the bedroom? Use night lights. Make sure that you have a light by your bed that is easy to reach. Do not use any sheets or blankets that are too big for your bed. They should not hang down onto the floor. Have a firm chair that has side arms. You can use this for support while you get dressed. Do not have throw rugs and other things on the floor that can make you trip. What can I do in the kitchen? Clean up any spills right away. Avoid walking on wet floors. Keep items that you use a lot in easy-to-reach places. If you need to reach something above you, use a strong step stool that has a grab bar. Keep electrical cords out of the way. Do not use floor polish or wax that makes floors slippery. If you must use wax, use non-skid floor wax. Do not have throw rugs and other things on the floor that  can make you trip. What can I do with my stairs? Do not leave any items on the stairs. Make sure that there are handrails on both sides of the stairs and use them. Fix handrails that are broken or loose. Make sure that handrails are as long as the stairways. Check any carpeting to make sure that it is firmly attached to the stairs. Fix any carpet that is loose or worn. Avoid having throw rugs at the top or bottom of the stairs. If you do have throw rugs, attach them to the floor with carpet tape. Make sure that you have a light switch at the top of the stairs and the bottom of the stairs. If you do not have them, ask someone to add them for you. What else can I do to help prevent falls? Wear shoes that: Do not have high heels. Have rubber bottoms. Are comfortable and fit you well. Are closed at the toe. Do not wear sandals. If you use a stepladder: Make sure that it is fully opened. Do not climb a closed  stepladder. Make sure that both sides of the stepladder are locked into place. Ask someone to hold it for you, if possible. Clearly mark and make sure that you can see: Any grab bars or handrails. First and last steps. Where the edge of each step is. Use tools that help you move around (mobility aids) if they are needed. These include: Canes. Walkers. Scooters. Crutches. Turn on the lights when you go into a dark area. Replace any light bulbs as soon as they burn out. Set up your furniture so you have a clear path. Avoid moving your furniture around. If any of your floors are uneven, fix them. If there are any pets around you, be aware of where they are. Review your medicines with your doctor. Some medicines can make you feel dizzy. This can increase your chance of falling. Ask your doctor what other things that you can do to help prevent falls. This information is not intended to replace advice given to you by your health care provider. Make sure you discuss any questions you have with your health care provider. Document Released: 10/14/2008 Document Revised: 05/26/2015 Document Reviewed: 01/22/2014 Elsevier Interactive Patient Education  2017 [employer].

## 2022-04-04 ENCOUNTER — Other Ambulatory Visit: Payer: Self-pay | Admitting: Family Medicine

## 2022-04-04 DIAGNOSIS — R1012 Left upper quadrant pain: Secondary | ICD-10-CM

## 2022-04-04 DIAGNOSIS — R1013 Epigastric pain: Secondary | ICD-10-CM

## 2022-04-04 DIAGNOSIS — R634 Abnormal weight loss: Secondary | ICD-10-CM

## 2022-04-04 DIAGNOSIS — Z9884 Bariatric surgery status: Secondary | ICD-10-CM

## 2022-04-05 ENCOUNTER — Encounter (HOSPITAL_BASED_OUTPATIENT_CLINIC_OR_DEPARTMENT_OTHER): Payer: Self-pay

## 2022-04-05 ENCOUNTER — Ambulatory Visit (HOSPITAL_BASED_OUTPATIENT_CLINIC_OR_DEPARTMENT_OTHER)
Admission: RE | Admit: 2022-04-05 | Discharge: 2022-04-05 | Disposition: A | Discharge status: Home / Self Care | Payer: PPO | Source: Ambulatory Visit | Attending: Family Medicine | Admitting: Family Medicine

## 2022-04-05 ENCOUNTER — Ambulatory Visit (HOSPITAL_BASED_OUTPATIENT_CLINIC_OR_DEPARTMENT_OTHER): Admission: RE | Admit: 2022-04-05 | Payer: PPO | Source: Ambulatory Visit

## 2022-04-05 DIAGNOSIS — R634 Abnormal weight loss: Secondary | ICD-10-CM | POA: Diagnosis not present

## 2022-04-05 DIAGNOSIS — Z9884 Bariatric surgery status: Secondary | ICD-10-CM | POA: Insufficient documentation

## 2022-04-05 DIAGNOSIS — R1013 Epigastric pain: Secondary | ICD-10-CM | POA: Insufficient documentation

## 2022-04-05 DIAGNOSIS — R1012 Left upper quadrant pain: Secondary | ICD-10-CM | POA: Insufficient documentation

## 2022-04-05 DIAGNOSIS — R109 Unspecified abdominal pain: Secondary | ICD-10-CM | POA: Diagnosis not present

## 2022-04-05 MED ORDER — IOHEXOL 300 MG/ML  SOLN
100.0000 mL | Freq: Once | INTRAMUSCULAR | Status: AC | PRN
  Administered 2022-04-05: 80 mL via INTRAVENOUS

## 2022-04-09 ENCOUNTER — Inpatient Hospital Stay (HOSPITAL_BASED_OUTPATIENT_CLINIC_OR_DEPARTMENT_OTHER): Admission: RE | Admit: 2022-04-09 | Payer: PPO | Source: Ambulatory Visit

## 2022-04-09 ENCOUNTER — Other Ambulatory Visit (HOSPITAL_BASED_OUTPATIENT_CLINIC_OR_DEPARTMENT_OTHER): Payer: PPO

## 2022-04-11 ENCOUNTER — Ambulatory Visit (HOSPITAL_BASED_OUTPATIENT_CLINIC_OR_DEPARTMENT_OTHER): Payer: PPO

## 2022-04-11 ENCOUNTER — Other Ambulatory Visit (HOSPITAL_BASED_OUTPATIENT_CLINIC_OR_DEPARTMENT_OTHER): Payer: PPO

## 2022-04-17 ENCOUNTER — Ambulatory Visit (HOSPITAL_BASED_OUTPATIENT_CLINIC_OR_DEPARTMENT_OTHER)
Admission: RE | Admit: 2022-04-17 | Discharge: 2022-04-17 | Disposition: A | Discharge status: Home / Self Care | Payer: PPO | Source: Ambulatory Visit | Attending: Family Medicine | Admitting: Family Medicine

## 2022-04-17 ENCOUNTER — Encounter (HOSPITAL_BASED_OUTPATIENT_CLINIC_OR_DEPARTMENT_OTHER): Payer: Self-pay

## 2022-04-17 DIAGNOSIS — Z780 Asymptomatic menopausal state: Secondary | ICD-10-CM | POA: Insufficient documentation

## 2022-04-17 DIAGNOSIS — Z1231 Encounter for screening mammogram for malignant neoplasm of breast: Secondary | ICD-10-CM | POA: Insufficient documentation

## 2022-04-17 DIAGNOSIS — E2839 Other primary ovarian failure: Secondary | ICD-10-CM | POA: Diagnosis not present

## 2022-04-17 DIAGNOSIS — M810 Age-related osteoporosis without current pathological fracture: Secondary | ICD-10-CM | POA: Diagnosis not present

## 2022-04-24 ENCOUNTER — Encounter: Payer: Self-pay | Admitting: Physician Assistant

## 2022-04-24 ENCOUNTER — Ambulatory Visit (INDEPENDENT_AMBULATORY_CARE_PROVIDER_SITE_OTHER): Payer: PPO | Admitting: Physician Assistant

## 2022-04-24 VITALS — BP 114/78 | HR 82 | Ht 62.0 in | Wt 121.8 lb

## 2022-04-24 DIAGNOSIS — R1013 Epigastric pain: Secondary | ICD-10-CM

## 2022-04-24 DIAGNOSIS — K5900 Constipation, unspecified: Secondary | ICD-10-CM

## 2022-04-24 DIAGNOSIS — D509 Iron deficiency anemia, unspecified: Secondary | ICD-10-CM

## 2022-04-24 MED ORDER — FAMOTIDINE 40 MG PO TABS: 40.0000 mg | ORAL_TABLET | Freq: Two times a day (BID) | ORAL | 5 refills | Status: DC

## 2022-04-24 NOTE — Progress Notes (Signed)
Chief Complaint: Epigastric pain and constipation  HPI:    Jennifer Sandoval is a 67 year old female with a past medical history as listed below, previously known to Dr. Jarold Motto, who was referred to me by Bradd Canary, MD for a complaint of epigastric pain and constipation.      05/10/2010 colonoscopy was normal.  Repeat recommended 10 years.    01/07/2022 patient hospitalized for pneumonia.    03/20/2022 CBC with hemoglobin 11.9 (01/23/2022 10.9, 01/14/2022 10.1) iron studies with an iron low at 31, percent saturation low 10 and ferritin low at 10.  Patient started on Hemocyte F tabs 1 tab p.o. daily, CT was also ordered at that time.    03/22/2022 fecal occult positive.    04/07/2022 CTAP with contrast showed no acute findings, large stool burden recommended clinical correlation for possible constipation.  Patient was advised to clean out.    Today, patient tells me that ever since she was in the hospital back in January for flu, COVID, multifocal pneumonia, strep and a UTI that caused sepsis for 8 days she has not felt "right since".  Does describe a gastric bypass back in 2001 and has really been somewhat anemic since then.  Most recently told to start iron by her PCP but never picked this up because she already battles constipation and did not want to make it worse.      Most concerning to her as an epigastric pain that feels sharp/gnawing with occasional nausea that seems to come and go, food does not particularly make it worse.  Anxiety and stress make it worse and she tells me she has a lot of that in her life.    Also describes constipation.  Apparently is on milk of magnesia for 3 days a week typically and occasional Dulcolax suppositories to help movement but still has just small balls.    Does have history of osteoporosis.    Denies fever, chills, weight loss, vomiting or symptoms that awaken her from sleep.  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   Dry eyes, bilateral 01/30/2016   Dyspepsia 04/19/2010   FRACTURE, NOSE 10/22/2007   History of gestational diabetes 01/30/2016   Insomnia 01/23/2012   Leiomyoma of uterus, unspecified 02/15/2010   Lumbar compression fracture    nonsurgical   OSTEOARTHRITIS 10/24/2006   PERIMENOPAUSAL STATUS 03/16/2010   Post-menopause 03/16/2010   Qualifier: Diagnosis of  By: Abner Greenspan MD, [facility]     Preventative health care 08/31/2012   SHOULDER PAIN, BILATERAL 10/10/2007    Past Surgical History:  Procedure Laterality Date   ABDOMINAL HYSTERECTOMY  01/02/2007   partial, ovaries left in place, for painful, heavy menstrrual bleeding secondary to anemia and fibroids   APPENDECTOMY     CESAREAN SECTION     X 3   CHOLECYSTECTOMY     GASTRIC BYPASS     HERNIA REPAIR     KNEE SURGERY Right    2018 or 2019   NASAL SINUS  SURGERY     SEPTOPLASTY     skin biopsy     L hip BCC, facial lesions were benign   TUBAL LIGATION     ventral herniorrhaphy     X 2    Current Outpatient Medications  Medication Sig Dispense Refill   Ferrous Fumarate-Folic Acid (HEMOCYTE-F) 324-1 MG TABS Take 1 mg by mouth daily. 30 tablet 2   Multiple Vitamin (MULTIVITAMIN WITH MINERALS) TABS tablet Take 1 tablet by mouth daily. 100 tablet 0   sertraline (ZOLOFT) 100 MG tablet Take 2 tablets (200 mg total) by mouth daily. 180 tablet 1   No current facility-administered medications for this visit.    Allergies as of 04/24/2022 - Review Complete 04/24/2022  Allergen Reaction Noted   Itraconazole     Levaquin  [levofloxacin in d5w]  10/10/2011    Family History  Problem Relation Age of Onset   Cholelithiasis Mother    Other Mother        Urinary incontince/ bladder prolapse/ h/o smoking   Cancer Mother        laryngeal carcinoma   Allergies Mother    Arthritis Mother        s/p knee and shoulder replacement   Dementia Mother        lewy body   Other Father        Renal failure   Hypertension Father    Coronary artery disease Father        s/p bypass   Aortic aneurysm Father    Hyperlipidemia Father    Heart disease Father 78       quadruple bypass   Spina bifida Brother        with stunt/ self cath   Seizures Brother        disorders   ADD / ADHD Daughter    ADD / ADHD Daughter    Depression Daughter        major depressive, anxiety, secondary psychosis   Cancer Maternal Grandmother        colon/ smoker   Cancer Maternal Grandfather        lung   Cancer Paternal Grandmother        Breast   Heart disease Paternal Grandfather    Stroke Paternal Grandfather     Social History   Socioeconomic History   Marital status: Married    Spouse name: Not on file   Number of children: Not on file   Years of education: Not on file   Highest education level: Not on file  Occupational History   Not on file  Tobacco Use   Smoking status: Never   Smokeless tobacco: Never  Vaping Use   Vaping Use: Never used  Substance and Sexual Activity   Alcohol use: Yes    Comment: special occasion   Drug use: No   Sexual activity: Yes    Partners: Male  Other Topics Concern   Not on file  Social History Narrative   Not on file   Social Determinants of Health   Financial Resource Strain: Low Risk  (03/25/2022)   Overall Financial Resource Strain (CARDIA)    Difficulty of Paying Living Expenses: Not very hard  Food Insecurity: No Food Insecurity (03/25/2022)   Hunger Vital Sign    Worried About Running Out of Food in the Last Year: Never true    Ran Out of Food in the Last Year:  Never true  Transportation Needs: No Transportation Needs (03/25/2022)  PRAPARE - Administrator, Civil Service (Medical): No    Lack of Transportation (Non-Medical): No  Physical Activity: Insufficiently Active (03/25/2022)   Exercise Vital Sign    Days of Exercise per Week: 4 days    Minutes of Exercise per Session: 20 min  Stress: No Stress Concern Present (03/25/2022)   [employer] of Occupational Health - Occupational Stress Questionnaire    Feeling of Stress : Only a little  Social Connections: Socially Integrated (03/25/2022)   Social Connection and Isolation Panel [NHANES]    Frequency of Communication with Friends and Family: More than three times a week    Frequency of Social Gatherings with Friends and Family: Once a week    Attends Religious Services: More than 4 times per year    Active Member of [employer] or Organizations: Yes    Attends Engineer, structural: More than 4 times per year    Marital Status: Married  Catering manager Violence: Not At Risk (03/26/2022)   Humiliation, Afraid, Rape, and Kick questionnaire    Fear of Current or Ex-Partner: No    Emotionally Abused: No    Physically Abused: No    Sexually Abused: No    Review of Systems:    Constitutional: No weight loss, fever or chills Skin: No rash  Cardiovascular: No chest pain Respiratory: No SOB  Gastrointestinal: See HPI and otherwise negative Genitourinary: No dysuria Neurological: No headache, dizziness or syncope Musculoskeletal: No new muscle or joint pain Hematologic: No bleeding Psychiatric: No history of depression or anxiety   Physical Exam:  Vital signs: BP 114/78   Pulse 82   Ht 5\' 2"  (1.575 m)   Wt 121 lb 12.8 oz (55.2 kg)   BMI 22.28 kg/m   Constitutional:   Pleasant Caucasian female appears to be in NAD, Well developed, Well nourished, alert and cooperative Head:  Normocephalic and atraumatic. Eyes:   PEERL, EOMI. No icterus. Conjunctiva pink. Ears:   Normal auditory acuity. Neck:  Supple Throat: Oral cavity and pharynx without inflammation, swelling or lesion.  Respiratory: Respirations even and unlabored. Lungs clear to auscultation bilaterally.   No wheezes, crackles, or rhonchi.  Cardiovascular: Normal S1, S2. No MRG. Regular rate and rhythm. No peripheral edema, cyanosis or pallor.  Gastrointestinal:  Soft, nondistended, moderate epigastric TTP, no rebound or guarding. Normal bowel sounds. No appreciable masses or hepatomegaly. Rectal:  Not performed.  Msk:  Symmetrical without gross deformities. Without edema, no deformity or joint abnormality.  Neurologic:  Alert and  oriented x4;  grossly normal neurologically.  Skin:   Dry and intact without significant lesions or rashes. Psychiatric: Demonstrates good judgement and reason without abnormal affect or behaviors.  RELEVANT LABS AND IMAGING: CBC    Component Value Date/Time   WBC 7.2 03/20/2022 1026   RBC 4.16 03/20/2022 1026   HGB 11.9 (L) 03/20/2022 1026   HCT 36.5 03/20/2022 1026   PLT 266.0 03/20/2022 1026   MCV 87.8 03/20/2022 1026   MCH 30.1 01/14/2022 0341   MCHC 32.7 03/20/2022 1026   RDW 15.2 03/20/2022 1026   LYMPHSABS 1.7 03/20/2022 1026   MONOABS 0.4 03/20/2022 1026   EOSABS 0.2 03/20/2022 1026   BASOSABS 0.0 03/20/2022 1026    CMP     Component Value Date/Time   NA 142 03/20/2022 1026   K 4.3 03/20/2022 1026   CL 104 03/20/2022 1026   CO2 29 03/20/2022 1026   GLUCOSE 94 03/20/2022 1026   BUN 14 03/20/2022 1026  CREATININE 0.62 03/20/2022 1026   CREATININE 0.71 02/15/2014 1410   CALCIUM 9.2 03/20/2022 1026   PROT 6.2 03/20/2022 1026   ALBUMIN 3.9 03/20/2022 1026   AST 21 03/20/2022 1026   ALT 14 03/20/2022 1026   ALKPHOS 110 03/20/2022 1026   BILITOT 0.3 03/20/2022 1026   GFRNONAA >60 01/13/2022 0711   GFRAA >60 11/01/2016 1800    Assessment: 1.  Epigastric pain: Started after being in the hospital in January and multiple antibiotics for  multifocal pneumonia and sepsis, previous history of Roux-en-Y; consider gastritis +/- PUD versus dyspepsia from antibiotics 2.  Constipation: Chronic for the patient, some better with milk of magnesia, recent imaging showed constipation, last colonoscopy in 2012 with recommendations to repeat in 10 years, she is overdue 3.  Iron deficiency anemia: Somewhat chronic, seems since time of Roux-en-Y gastric bypass which is likely the cause, but now that she is Hemoccult positive stool and other GI symptoms recommend EGD and colonoscopy 4.  History of Roux-en-Y gastric bypass  Plan: 1.  Scheduled patient for diagnostic EGD and colonoscopy in the [facility] for iron deficiency anemia and multiple other complaints with Dr. Lavon Paganini.  Did discuss risks, benefits, limitations and alternatives and patient agrees to proceed. Patient is appropriate for endoscopic procedure(s) in the ambulatory ([facility]) setting.  Patient told to do 2-day bowel prep. 2.  Recommend the patient start the iron supplement given by her PCP. 3.  Start MiraLAX twice daily.  Discussed titrating this up and down, can use up to 4 times a day if needed. 4.  Prescribed Pepcid 40 mg twice daily, every morning and nightly #60 with 5 refills.  (We will wait on a PPI given her history of osteoporosis and see how EGD looks) 5.  Patient to follow in clinic per recommendations after time of procedures.  Hyacinth Meeker, PA-C [facility] 04/24/2022, 1:35 PM  Cc: Bradd Canary, MD

## 2022-04-24 NOTE — Patient Instructions (Signed)
You have been scheduled for an endoscopy and colonoscopy. Please follow the written instructions given to you at your visit today. Please pick up your prep supplies at the pharmacy within the next 1-3 days. If you use inhalers (even only as needed), please bring them with you on the day of your procedure.  Take one dose of over the counter Miralax twice daily. Generic is fine.  Start your iron supplement. But remember to hold it 5 days prior to your procedure.  We have sent the following medications to your pharmacy for you to pick up at your convenience: Famotidine   _______________________________________________________  If your blood pressure at your visit was 140/90 or greater, please contact your primary care physician to follow up on this.  _______________________________________________________  If you are age 63 or older, your body mass index should be between 23-30. Your Body mass index is 22.28 kg/m. If this is out of the aforementioned range listed, please consider follow up with your Primary Care Provider.  If you are age 5 or younger, your body mass index should be between 19-25. Your Body mass index is 22.28 kg/m. If this is out of the aformentioned range listed, please consider follow up with your Primary Care Provider.   ________________________________________________________  The [facility] providers would like to encourage you to use [facility] to communicate with providers for non-urgent requests or questions.  Due to long hold times on the telephone, sending your provider a message by [facility] may be a faster and more efficient way to get a response.  Please allow 48 business hours for a response.  Please remember that this is for non-urgent requests.  _______________________________________________________   I appreciate the opportunity to care for you. Hyacinth Meeker, PA-C

## 2022-05-02 ENCOUNTER — Telehealth: Payer: PPO | Admitting: Family Medicine

## 2022-05-02 ENCOUNTER — Telehealth (INDEPENDENT_AMBULATORY_CARE_PROVIDER_SITE_OTHER): Payer: PPO | Admitting: Family Medicine

## 2022-05-02 ENCOUNTER — Encounter: Payer: Self-pay | Admitting: Family Medicine

## 2022-05-02 DIAGNOSIS — J329 Chronic sinusitis, unspecified: Secondary | ICD-10-CM | POA: Diagnosis not present

## 2022-05-02 MED ORDER — DOXYCYCLINE HYCLATE 100 MG PO TABS: 100.0000 mg | ORAL_TABLET | Freq: Two times a day (BID) | ORAL | 0 refills | Status: DC

## 2022-05-02 NOTE — Progress Notes (Signed)
Virtual Visit via Video Note  I connected with [name] on 05/02/22 at 3:09 PM by a video enabled telemedicine application and verified that I am speaking with the correct person using two identifiers.  Patient location:home, by self My location: office - [place].    I discussed the limitations, risks, security and privacy concerns of performing an evaluation and management service by telephone and the availability of in person appointments. I also discussed with the patient that there may be a patient responsible charge related to this service. The patient expressed understanding and agreed to proceed, consent obtained  Chief complaint:  Chief Complaint  Patient presents with   URI    Patient states she usually gets a sinus infection and she has congestion, headache, green drainage. Denies any fever, symptoms started about a week ago    History of Present Illness: Jennifer Sandoval is a 67 y.o. female  Sinus congestion: History of sinus issues, prior sinusitis treated with doxycycline in the past. Referred to ENT in February.  Acute symptoms past week to 10 days. more congestion, more drainage, green nasal discharge, with some blood tinged d/c. No fever. Some head pressure, headache. Minimal cough just started with PND. No wheeze or dyspnea.  Has required 14 days doxy treatment in past. Has had recurrence with shorter courses.   Tx: mucinex - some relief few nights ago. Flonase - min relief. Saline NS.    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   Epigastric pain 04/19/2010   Post-menopause 03/16/2010   BASAL CELL CARCINOMA SKIN LOWER LIMB INCL HIP 02/15/2010   LEIOMYOMA OF UTERUS, UNSPECIFIED 02/15/2010   FRACTURE, NOSE 10/22/2007   Neck and shoulder pain 10/10/2007   Anemia 10/24/2006   Depression with anxiety 10/24/2006   OSTEOARTHRITIS 10/24/2006   Allergic rhinitis 08/15/2006   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   Dry eyes, bilateral 01/30/2016   Dyspepsia 04/19/2010   FRACTURE, NOSE 10/22/2007   History of gestational diabetes 01/30/2016   Insomnia 01/23/2012   Leiomyoma of uterus, unspecified 02/15/2010   Lumbar compression fracture (HCC)    nonsurgical   OSTEOARTHRITIS 10/24/2006   PERIMENOPAUSAL STATUS 03/16/2010   Post-menopause 03/16/2010   Qualifier: Diagnosis of  By: Abner Greenspan MD, [place]     Preventative health care 08/31/2012   SHOULDER PAIN, BILATERAL 10/10/2007   Past Surgical History:  Procedure Laterality Date   ABDOMINAL HYSTERECTOMY  01/02/2007   partial, ovaries left in place, for painful, heavy menstrrual bleeding secondary to  anemia and fibroids   APPENDECTOMY     CESAREAN SECTION     X 3   CHOLECYSTECTOMY     GASTRIC BYPASS     HERNIA REPAIR     KNEE SURGERY Right    2018 or 2019   NASAL SINUS SURGERY     SEPTOPLASTY     skin biopsy     L hip BCC, facial lesions were benign   TUBAL LIGATION     ventral herniorrhaphy     X 2   Allergies  Allergen Reactions   Itraconazole      REACTION: Rash   Levaquin [Levofloxacin In D5w]     Joint pain   Prior to Admission medications   Medication Sig Start Date End Date Taking? Authorizing Provider  famotidine (PEPCID) 40 MG tablet Take 1 tablet (40 mg total) by mouth 2 (two) times daily. Take 30 minutes prior to food 04/24/22  Yes Lemmon, Violet Baldy, PA  fluticasone [facility]) 50 MCG/ACT nasal spray Place into both nostrils daily.   Yes [provider]  Multiple Vitamin (MULTIVITAMIN WITH MINERALS) TABS tablet Take 1 tablet by mouth daily. 01/14/22  Yes Pokhrel, Laxman, MD  sertraline (ZOLOFT) 100 MG tablet Take 2 tablets (200 mg total) by mouth daily. 03/20/22  Yes Bradd Canary, MD  Ferrous Fumarate-Folic Acid (HEMOCYTE-F) 324-1 MG TABS Take 1 mg by mouth daily. Patient not taking: Reported on 04/24/2022 03/22/22   Bradd Canary, MD   Social History   Socioeconomic History   Marital status: Married    Spouse name: Not on file   Number of children: Not on file   Years of education: Not on file   Highest education level: Not on file  Occupational History   Not on file  Tobacco Use   Smoking status: Never   Smokeless tobacco: Never  Vaping Use   Vaping Use: Never used  Substance and Sexual Activity   Alcohol use: Yes    Comment: special occasion   Drug use: No   Sexual activity: Yes    Partners: Male  Other Topics Concern   Not on file  Social History Narrative   Not on file   Social Determinants of Health   Financial Resource Strain: Low Risk  (03/25/2022)   Overall Financial Resource Strain (CARDIA)    Difficulty of Paying Living Expenses: Not very hard  Food Insecurity: No Food Insecurity (03/25/2022)   Hunger Vital Sign    Worried About Running Out of Food in the Last Year: Never true    Ran Out of Food in the Last Year: Never true  Transportation Needs: No Transportation Needs (03/25/2022)   PRAPARE - Administrator, Civil Service (Medical): No    Lack of Transportation  (Non-Medical): No  Physical Activity: Insufficiently Active (03/25/2022)   Exercise Vital Sign    Days of Exercise per Week: 4 days    Minutes of Exercise per Session: 20 min  Stress: No Stress Concern Present (03/25/2022)   [employer] of Occupational Health - Occupational Stress Questionnaire    Feeling of Stress : Only a little  Social Connections: Socially Integrated (03/25/2022)   Social Connection and Isolation Panel [NHANES]    Frequency of Communication with Friends and Family: More than three times a week    Frequency of Social Gatherings with Friends and Family: Once a week    Attends Religious Services: More than 4 times per year    Active Member of [employer]  or Organizations: Yes    Attends Banker Meetings: More than 4 times per year    Marital Status: Married  Catering manager Violence: Not At Risk (03/26/2022)   Humiliation, Afraid, Rape, and Kick questionnaire    Fear of Current or Ex-Partner: No    Emotionally Abused: No    Physically Abused: No    Sexually Abused: No    Observations/Objective: Nontoxic appearance on video.  Speaking in full sentences with no respiratory distress, audible wheeze, or stridor.  Appropriate responses.  Euthymic mood.  All questions were answered with understanding plan expressed.  Assessment and Plan: Recurrent sinusitis History of chronic sinusitis with acute symptoms past week to 10 days without improvement using over-the-counter treatments.  No concerning symptoms at this time.  Start doxycycline, 2-week course as that has been required previously, symptomatic care with RTC precautions given.  Follow Up Instructions: As needed   I discussed the assessment and treatment plan with the patient. The patient was provided an opportunity to ask questions and all were answered. The patient agreed with the plan and demonstrated an understanding of the instructions.   The patient was advised to call back or seek an in-person  evaluation if the symptoms worsen or if the condition fails to improve as anticipated.   Shade Flood, MD

## 2022-05-02 NOTE — Patient Instructions (Signed)
Good talk today.  Sorry to hear that you have another sinus infection.  I sent the doxycycline antibiotic to your pharmacy, 2-week course.  Saline nasal spray can be helpful, drink plenty of fluids.  See other information below.  If any fever, or acute worsening symptoms I do recommend you be seen in person.  I do not expect that to happen.  Let us know if there are questions and get better soon!  Sinus Infection, Adult A sinus infection, also called sinusitis, is inflammation of your sinuses. Sinuses are hollow spaces in the bones around your face. Your sinuses are located: Around your eyes. In the middle of your forehead. Behind your nose. In your cheekbones. Mucus normally drains out of your sinuses. When your nasal tissues become inflamed or swollen, mucus can become trapped or blocked. This allows bacteria, viruses, and fungi to grow, which leads to infection. Most infections of the sinuses are caused by a virus. A sinus infection can develop quickly. It can last for up to 4 weeks (acute) or for more than 12 weeks (chronic). A sinus infection often develops after a cold. What are the causes? This condition is caused by anything that creates swelling in the sinuses or stops mucus from draining. This includes: Allergies. Asthma. Infection from bacteria or viruses. Deformities or blockages in your nose or sinuses. Abnormal growths in the nose (nasal polyps). Pollutants, such as chemicals or irritants in the air. Infection from fungi. This is rare. What increases the risk? You are more likely to develop this condition if you: Have a weak body defense system (immune system). Do a lot of swimming or diving. Overuse nasal sprays. Smoke. What are the signs or symptoms? The main symptoms of this condition are pain and a feeling of pressure around the affected sinuses. Other symptoms include: Stuffy nose or congestion that makes it difficult to breathe through your nose. Thick yellow or  greenish drainage from your nose. Tenderness, swelling, and warmth over the affected sinuses. A cough that may get worse at night. Decreased sense of smell and taste. Extra mucus that collects in the throat or the back of the nose (postnasal drip) causing a sore throat or bad breath. Tiredness (fatigue). Fever. How is this diagnosed? This condition is diagnosed based on: Your symptoms. Your medical history. A physical exam. Tests to find out if your condition is acute or chronic. This may include: Checking your nose for nasal polyps. Viewing your sinuses using a device that has a light (endoscope). Testing for allergies or bacteria. Imaging tests, such as an MRI or CT scan. In rare cases, a bone biopsy may be done to rule out more serious types of fungal sinus disease. How is this treated? Treatment for a sinus infection depends on the cause and whether your condition is chronic or acute. If caused by a virus, your symptoms should go away on their own within 10 days. You may be given medicines to relieve symptoms. They include: Medicines that shrink swollen nasal passages (decongestants). A spray that eases inflammation of the nostrils (topical intranasal corticosteroids). Rinses that help get rid of thick mucus in your nose (nasal saline washes). Medicines that treat allergies (antihistamines). Over-the-counter pain relievers. If caused by bacteria, your health care provider may recommend waiting to see if your symptoms improve. Most bacterial infections will get better without antibiotic medicine. You may be given antibiotics if you have: A severe infection. A weak immune system. If caused by narrow nasal passages or nasal polyps,  surgery may be needed. Follow these instructions at home: Medicines Take, use, or apply over-the-counter and prescription medicines only as told by your health care provider. These may include nasal sprays. If you were prescribed an antibiotic medicine,  take it as told by your health care provider. Do not stop taking the antibiotic even if you start to feel better. Hydrate and humidify  Drink enough fluid to keep your urine pale yellow. Staying hydrated will help to thin your mucus. Use a cool mist humidifier to keep the humidity level in your home above 50%. Inhale steam for 10-15 minutes, 3-4 times a day, or as told by your health care provider. You can do this in the bathroom while a hot shower is running. Limit your exposure to cool or dry air. Rest Rest as much as possible. Sleep with your head raised (elevated). Make sure you get enough sleep each night. General instructions  Apply a warm, moist washcloth to your face 3-4 times a day or as told by your health care provider. This will help with discomfort. Use nasal saline washes as often as told by your health care provider. Wash your hands often with soap and water to reduce your exposure to germs. If soap and water are not available, use hand sanitizer. Do not smoke. Avoid being around people who are smoking (secondhand smoke). Keep all follow-up visits. This is important. Contact a health care provider if: You have a fever. Your symptoms get worse. Your symptoms do not improve within 10 days. Get help right away if: You have a severe headache. You have persistent vomiting. You have severe pain or swelling around your face or eyes. You have vision problems. You develop confusion. Your neck is stiff. You have trouble breathing. These symptoms may be an emergency. Get help right away. Call 911. Do not wait to see if the symptoms will go away. Do not drive yourself to the hospital. Summary A sinus infection is soreness and inflammation of your sinuses. Sinuses are hollow spaces in the bones around your face. This condition is caused by nasal tissues that become inflamed or swollen. The swelling traps or blocks the flow of mucus. This allows bacteria, viruses, and fungi to  grow, which leads to infection. If you were prescribed an antibiotic medicine, take it as told by your health care provider. Do not stop taking the antibiotic even if you start to feel better. Keep all follow-up visits. This is important. This information is not intended to replace advice given to you by your health care provider. Make sure you discuss any questions you have with your health care provider. Document Revised: 11/22/2020 Document Reviewed: 11/22/2020 Elsevier Patient Education  2023 [employer].

## 2022-05-03 DIAGNOSIS — H5203 Hypermetropia, bilateral: Secondary | ICD-10-CM | POA: Diagnosis not present

## 2022-05-03 DIAGNOSIS — H524 Presbyopia: Secondary | ICD-10-CM | POA: Diagnosis not present

## 2022-05-03 DIAGNOSIS — H52223 Regular astigmatism, bilateral: Secondary | ICD-10-CM | POA: Diagnosis not present

## 2022-05-10 ENCOUNTER — Other Ambulatory Visit: Payer: Self-pay | Admitting: Family Medicine

## 2022-05-11 ENCOUNTER — Other Ambulatory Visit: Payer: Self-pay | Admitting: Family Medicine

## 2022-05-11 MED ORDER — IRON (FERROUS SULFATE) 325 (65 FE) MG PO TABS
325.0000 mg | ORAL_TABLET | Freq: Every day | ORAL | 3 refills | Status: DC
Start: 2022-05-11 — End: 2023-04-04

## 2022-05-11 NOTE — Telephone Encounter (Signed)
Alternative Requested:HEMOCYTE-F IS NOT AVAILABLE FROM [facility]END[facility].Marland KitchenCAN FILL 2 DRUGS SEPARATELY WITH NEW RXS.

## 2022-05-17 ENCOUNTER — Encounter: Payer: Self-pay | Admitting: Gastroenterology

## 2022-05-27 ENCOUNTER — Encounter: Payer: Self-pay | Admitting: Certified Registered Nurse Anesthetist

## 2022-05-30 ENCOUNTER — Ambulatory Visit: Payer: PPO | Admitting: Gastroenterology

## 2022-05-30 ENCOUNTER — Ambulatory Visit (AMBULATORY_SURGERY_CENTER): Payer: PPO | Admitting: Gastroenterology

## 2022-05-30 ENCOUNTER — Encounter: Payer: Self-pay | Admitting: Gastroenterology

## 2022-05-30 VITALS — BP 126/85 | HR 77 | Temp 98.4°F | Resp 16 | Ht 62.0 in | Wt 121.0 lb

## 2022-05-30 DIAGNOSIS — K5900 Constipation, unspecified: Secondary | ICD-10-CM

## 2022-05-30 DIAGNOSIS — F32A Depression, unspecified: Secondary | ICD-10-CM | POA: Diagnosis not present

## 2022-05-30 DIAGNOSIS — R1013 Epigastric pain: Secondary | ICD-10-CM

## 2022-05-30 DIAGNOSIS — K2950 Unspecified chronic gastritis without bleeding: Secondary | ICD-10-CM | POA: Diagnosis not present

## 2022-05-30 DIAGNOSIS — K259 Gastric ulcer, unspecified as acute or chronic, without hemorrhage or perforation: Secondary | ICD-10-CM | POA: Diagnosis not present

## 2022-05-30 DIAGNOSIS — D509 Iron deficiency anemia, unspecified: Secondary | ICD-10-CM | POA: Diagnosis not present

## 2022-05-30 MED ORDER — FAMOTIDINE 20 MG PO TABS: 20.0000 mg | ORAL_TABLET | Freq: Every day | ORAL | 1 refills | Status: DC

## 2022-05-30 MED ORDER — SODIUM CHLORIDE 0.9 % IV SOLN: 500.0000 mL | INTRAVENOUS | Status: DC

## 2022-05-30 MED ORDER — SUCRALFATE 1 GM/10ML PO SUSP: 1.0000 g | Freq: Four times a day (QID) | ORAL | 0 refills | Status: DC

## 2022-05-30 MED ORDER — PANTOPRAZOLE SODIUM 40 MG PO TBEC: 40.0000 mg | DELAYED_RELEASE_TABLET | Freq: Every morning | ORAL | 1 refills | Status: DC

## 2022-05-30 NOTE — Progress Notes (Unsigned)
Called to room to assist during endoscopic procedure.  Patient ID and intended procedure confirmed with present staff. Received instructions for my participation in the procedure from the performing physician.  

## 2022-05-30 NOTE — Op Note (Signed)
[facility] Patient Name: Jennifer Sandoval Procedure Date: 05/30/2022 2:57 PM MRN: [id number] Endoscopist: Napoleon Form , MD, [id number] Age: 67 Referring MD:  Date of Birth: 05-19-1955 Gender: Female Account #: [id number] Procedure:                Colonoscopy Indications:              Unexplained iron deficiency anemia Medicines:                Monitored Anesthesia Care Procedure:                Pre-Anesthesia Assessment:                           - Prior to the procedure, a History and Physical                            was performed, and patient medications and                            allergies were reviewed. The patient's tolerance of                            previous anesthesia was also reviewed. The risks                            and benefits of the procedure and the sedation                            options and risks were discussed with the patient.                            All questions were answered, and informed consent                            was obtained. Prior Anticoagulants: The patient has                            taken no anticoagulant or antiplatelet agents. ASA                            Grade Assessment: II - A patient with mild systemic                            disease. After reviewing the risks and benefits,                            the patient was deemed in satisfactory condition to                            undergo the procedure.                           After obtaining informed consent, the colonoscope  was passed under direct vision. Throughout the                            procedure, the patient's blood pressure, pulse, and                            oxygen saturations were monitored continuously. The                            Olympus [id number]-[id number] (#[id number]) Colonoscope was                            introduced through the anus and advanced to the the                            cecum, identified by  appendiceal orifice and                            ileocecal valve. The colonoscopy was performed                            without difficulty. The patient tolerated the                            procedure well. The quality of the bowel                            preparation was good. The ileocecal valve,                            appendiceal orifice, and rectum were photographed. Scope In: 3:14:31 PM Scope Out: 3:34:37 PM Scope Withdrawal Time: 0 hours 9 minutes 24 seconds  Total Procedure Duration: 0 hours 20 minutes 6 seconds  Findings:                 The perianal and digital rectal examinations were                            normal.                           Non-bleeding internal hemorrhoids were found during                            retroflexion. The hemorrhoids were small.                           The exam was otherwise without abnormality. Complications:            No immediate complications. Estimated Blood Loss:     Estimated blood loss: none. Impression:               - Non-bleeding internal hemorrhoids.                           - The examination was otherwise normal.                           -  No specimens collected. Recommendation:           - Patient has a contact number available for                            emergencies. The signs and symptoms of potential                            delayed complications were discussed with the                            patient. Return to normal activities tomorrow.                            Written discharge instructions were provided to the                            patient.                           - Resume previous diet.                           - Continue present medications.                           - Repeat colonoscopy in 10 years for surveillance. Napoleon Form, MD 05/30/2022 3:49:06 PM This report has been signed electronically.

## 2022-05-30 NOTE — Progress Notes (Unsigned)
[facility] History and Physical   Primary Care Physician:  Bradd Canary, MD   Reason for Procedure:  Epigastric pain, iron deficiency anemia  Plan:    EGD and colonoscopy with possible interventions as needed     HPI: Jennifer Sandoval is a very pleasant 67 y.o. female here for EGD and colonoscopy for iron deficiency anemia, epigastric pain.   The risks and benefits as well as alternatives of endoscopic procedure(s) have been discussed and reviewed. All questions answered. The patient agrees to proceed.    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   Dry eyes, bilateral 01/30/2016   Dyspepsia 04/19/2010   FRACTURE, NOSE 10/22/2007   GERD (gastroesophageal reflux disease)    History of gestational diabetes 01/30/2016   Insomnia 01/23/2012   Leiomyoma of uterus, unspecified 02/15/2010   Lumbar compression fracture (HCC)    nonsurgical   OSTEOARTHRITIS 10/24/2006   Osteoporosis    PERIMENOPAUSAL STATUS 03/16/2010   Post-menopause 03/16/2010   Qualifier: Diagnosis of  By: Abner Greenspan MD, Stacey     Preventative health care 08/31/2012   SHOULDER PAIN, BILATERAL 10/10/2007    Past Surgical History:  Procedure Laterality Date   ABDOMINAL HYSTERECTOMY  01/02/2007   partial, ovaries left in place, for painful, heavy menstrrual bleeding secondary to anemia and fibroids   APPENDECTOMY     CESAREAN SECTION     X 3   CHOLECYSTECTOMY     COLONOSCOPY     GASTRIC BYPASS     HERNIA REPAIR     KNEE SURGERY Right    2018 or 2019    NASAL SINUS SURGERY     SEPTOPLASTY     skin biopsy     L hip BCC, facial lesions were benign   TUBAL LIGATION     ventral herniorrhaphy     X 2    Prior to Admission medications   Medication Sig Start Date End Date Taking? Authorizing Provider  famotidine (PEPCID) 40 MG tablet Take 1 tablet (40 mg total) by mouth 2 (two) times daily. Take 30 minutes prior to food 04/24/22  Yes Lemmon, Violet Baldy, PA  Multiple Vitamin (MULTIVITAMIN WITH MINERALS) TABS tablet Take 1 tablet by mouth daily. 01/14/22  Yes Pokhrel, Laxman, MD  sertraline (ZOLOFT) 100 MG tablet Take 2 tablets (200 mg total) by mouth daily. 03/20/22  Yes Bradd Canary, MD  fluticasone (FLONASE) 50 MCG/ACT nasal spray Place into both nostrils daily.    [provider]  Iron, Ferrous Sulfate, 325 (65 Fe) MG TABS Take 325 mg by mouth daily. Patient not taking: Reported on 05/30/2022 05/11/22   Bradd Canary, MD    Current Outpatient Medications  Medication Sig Dispense Refill   famotidine (PEPCID) 40 MG tablet Take 1 tablet (40 mg total) by mouth 2 (two) times daily. Take 30 minutes prior to food 60 tablet 5   Multiple Vitamin (MULTIVITAMIN WITH MINERALS) TABS tablet Take 1 tablet by mouth daily. 100 tablet 0  sertraline (ZOLOFT) 100 MG tablet Take 2 tablets (200 mg total) by mouth daily. 180 tablet 1   fluticasone (FLONASE) 50 MCG/ACT nasal spray Place into both nostrils daily.     Iron, Ferrous Sulfate, 325 (65 Fe) MG TABS Take 325 mg by mouth daily. (Patient not taking: Reported on 05/30/2022) 30 tablet 3   Current Facility-Administered Medications  Medication Dose Route Frequency Provider Last Rate Last Admin   0.9 %  sodium chloride infusion  500 mL Intravenous Continuous Kiona Blume, Eleonore Chiquito, MD        Allergies as of 05/30/2022 - Review Complete 05/30/2022  Allergen Reaction Noted   Itraconazole     Levaquin [levofloxacin in d5w]  10/10/2011    Family History  Problem Relation Age of Onset    Cholelithiasis Mother    Other Mother        Urinary incontince/ bladder prolapse/ h/o smoking   Cancer Mother        laryngeal carcinoma   Allergies Mother    Arthritis Mother        s/p knee and shoulder replacement   Dementia Mother        lewy body   Other Father        Renal failure   Hypertension Father    Coronary artery disease Father        s/p bypass   Aortic aneurysm Father    Hyperlipidemia Father    Heart disease Father 63       quadruple bypass   Spina bifida Brother        with stunt/ self cath   Seizures Brother        disorders   Colon cancer Maternal Grandmother    Cancer Maternal Grandmother        colon/ smoker   Cancer Maternal Grandfather        lung   Cancer Paternal Grandmother        Breast   Heart disease Paternal Grandfather    Stroke Paternal Grandfather    ADD / ADHD Daughter    ADD / ADHD Daughter    Depression Daughter        major depressive, anxiety, secondary psychosis   Rectal cancer Neg Hx    Stomach cancer Neg Hx    Esophageal cancer Neg Hx     Social History   Socioeconomic History   Marital status: Married    Spouse name: Not on file   Number of children: Not on file   Years of education: Not on file   Highest education level: Not on file  Occupational History   Not on file  Tobacco Use   Smoking status: Never   Smokeless tobacco: Never  Vaping Use   Vaping Use: Never used  Substance and Sexual Activity   Alcohol use: Yes    Comment: special occasion   Drug use: No   Sexual activity: Yes    Partners: Male  Other Topics Concern   Not on file  Social History Narrative   Not on file   Social Determinants of Health   Financial Resource Strain: Low Risk  (03/25/2022)   Overall Financial Resource Strain (CARDIA)    Difficulty of Paying Living Expenses: Not very hard  Food Insecurity: No Food Insecurity (03/25/2022)   Hunger Vital Sign    Worried About Running Out of Food in the Last Year: Never true    Ran Out  of Food in the Last Year: Never true  Transportation  Needs: No Transportation Needs (03/25/2022)   PRAPARE - Administrator, Civil Service (Medical): No    Lack of Transportation (Non-Medical): No  Physical Activity: Insufficiently Active (03/25/2022)   Exercise Vital Sign    Days of Exercise per Week: 4 days    Minutes of Exercise per Session: 20 min  Stress: No Stress Concern Present (03/25/2022)   [employer] of Occupational Health - Occupational Stress Questionnaire    Feeling of Stress : Only a little  Social Connections: Socially Integrated (03/25/2022)   Social Connection and Isolation Panel [NHANES]    Frequency of Communication with Friends and Family: More than three times a week    Frequency of Social Gatherings with Friends and Family: Once a week    Attends Religious Services: More than 4 times per year    Active Member of [employer] or Organizations: Yes    Attends Engineer, structural: More than 4 times per year    Marital Status: Married  Catering manager Violence: Not At Risk (03/26/2022)   Humiliation, Afraid, Rape, and Kick questionnaire    Fear of Current or Ex-Partner: No    Emotionally Abused: No    Physically Abused: No    Sexually Abused: No    Review of Systems:  All other review of systems negative except as mentioned in the HPI.  Physical Exam: Vital signs in last 24 hours: Blood Pressure (Abnormal) 152/96   Pulse 80   Temperature 98.4 F (36.9 C) (Temporal)   Height 5\' 2"  (1.575 m)   Weight 121 lb (54.9 kg)   Oxygen Saturation 98%   Body Mass Index 22.13 kg/m  General:   Alert, NAD Lungs:  Clear .   Heart:  Regular rate and rhythm Abdomen:  Soft, nontender and nondistended. Neuro/Psych:  Alert and cooperative. Normal mood and affect. A and O x 3  Reviewed labs, radiology imaging, old records and pertinent past GI work up  Patient is appropriate for planned procedure(s) and anesthesia in an ambulatory setting   K. Scherry Ran , MD [phone]

## 2022-05-30 NOTE — Op Note (Signed)
[facility] Patient Name: Jennifer Sandoval Procedure Date: 05/30/2022 2:59 PM MRN: [id number] Endoscopist: Napoleon Form , MD, [id number] Age: 67 Referring MD:  Date of Birth: 1955-06-12 Gender: Female Account #: [id number] Procedure:                Upper GI endoscopy Indications:              Suspected upper gastrointestinal bleeding in                            patient with unexplained iron deficiency anemia Medicines:                Monitored Anesthesia Care Procedure:                Pre-Anesthesia Assessment:                           - Prior to the procedure, a History and Physical                            was performed, and patient medications and                            allergies were reviewed. The patient's tolerance of                            previous anesthesia was also reviewed. The risks                            and benefits of the procedure and the sedation                            options and risks were discussed with the patient.                            All questions were answered, and informed consent                            was obtained. Prior Anticoagulants: The patient has                            taken no anticoagulant or antiplatelet agents. ASA                            Grade Assessment: II - A patient with mild systemic                            disease. After reviewing the risks and benefits,                            the patient was deemed in satisfactory condition to                            undergo the procedure.  After obtaining informed consent, the endoscope was                            passed under direct vision. Throughout the                            procedure, the patient's blood pressure, pulse, and                            oxygen saturations were monitored continuously. The                            GIF [id number] #[id number] was introduced through the                            mouth, and  advanced to the afferent and efferent                            jejunal loops. The upper GI endoscopy was                            accomplished without difficulty. The patient                            tolerated the procedure well. Scope In: Scope Out: Findings:                 The Z-line was regular and was found 35 cm from the                            incisors.                           Evidence of a gastric bypass was found. A gastric                            pouch with a normal size was found. The staple line                            appeared intact. The gastrojejunal anastomosis was                            characterized by congestion, erosion, erythema and                            friable mucosa. This was traversed. The                            pouch-to-jejunum limb was characterized by                            ulceration. The jejunojejunal anastomosis was                            characterized by erosion and  erythema. Biopsies                            were taken with a cold forceps for histology.                            Biopsies were taken with a cold forceps for                            Helicobacter pylori testing.                           The examined jejunum was normal. Complications:            No immediate complications. Estimated Blood Loss:     Estimated blood loss was minimal. Impression:               - Z-line regular, 35 cm from the incisors.                           - Gastric bypass with a normal-sized pouch and                            intact staple line. Gastrojejunal anastomosis                            characterized by congestion, erosion, erythema and                            friable mucosa. Biopsied.                           - Normal examined jejunum. Recommendation:           - Patient has a contact number available for                            emergencies. The signs and symptoms of potential                             delayed complications were discussed with the                            patient. Return to normal activities tomorrow.                            Written discharge instructions were provided to the                            patient.                           - Resume previous diet.                           - Continue present medications.                           -  Await pathology results.                           - No aspirin, ibuprofen, naproxen, or other                            non-steroidal anti-inflammatory drugs.                           - Use Protonix (pantoprazole) 40 mg PO daily in the                            AM before breakfast. Rx for 90 days with 1 refill                           - Use Pepcid (famotidine) 20 mg PO daily in the                            evening. Rx for 90 days with 1 refill                           - Use sucralfate suspension 1 gram PO QID for 2                            weeks.                           - Follow up in GI office in 3 months, next available Napoleon Form, MD 05/30/2022 3:53:08 PM This report has been signed electronically.

## 2022-05-30 NOTE — Progress Notes (Unsigned)
Report given to PACU, vss 

## 2022-05-30 NOTE — Patient Instructions (Addendum)
Handouts provided:  Gastritis  REPEAT Colonoscopy in 10 years for surveillance.  START taking Protonix 40 mg PO daily in the Morning before breakfast. Medication sent to your pharmacy.  START taking Pepcid 20 mg PO daily in the evening. Medication sent to your pharmacy.  Use sucralfate suspension 1 gram PO 4 times daily for 2 weeks.  Medication sent to your pharmacy.   Follow up in GI office in 3 months--please see scheduled appointment.  NO aspirin, ibuprofen, naproxen, or other non-steroidal anti-inflammatory drugs.  YOU HAD AN ENDOSCOPIC PROCEDURE TODAY AT THE [facility]:   Refer to the procedure report that was given to you for any specific questions about what was found during the examination.  If the procedure report does not answer your questions, please call your gastroenterologist to clarify.  If you requested that your care partner not be given the details of your procedure findings, then the procedure report has been included in a sealed envelope for you to review at your convenience later.  YOU SHOULD EXPECT: Some feelings of bloating in the abdomen. Passage of more gas than usual.  Walking can help get rid of the air that was put into your GI tract during the procedure and reduce the bloating. If you had a lower endoscopy (such as a colonoscopy or flexible sigmoidoscopy) you may notice spotting of blood in your stool or on the toilet paper. If you underwent a bowel prep for your procedure, you may not have a normal bowel movement for a few days.  Please Note:  You might notice some irritation and congestion in your nose or some drainage.  This is from the oxygen used during your procedure.  There is no need for concern and it should clear up in a day or so.  SYMPTOMS TO REPORT IMMEDIATELY:  Following lower endoscopy (colonoscopy or flexible sigmoidoscopy):  Excessive amounts of blood in the stool  Significant tenderness or worsening of abdominal pains  Swelling of  the abdomen that is new, acute  Fever of 100F or higher  Following upper endoscopy (EGD)  Vomiting of blood or coffee ground material  New chest pain or pain under the shoulder blades  Painful or persistently difficult swallowing  New shortness of breath  Fever of 100F or higher  Black, tarry-looking stools  For urgent or emergent issues, a gastroenterologist can be reached at any hour by calling [phone]. Do not use MyChart messaging for urgent concerns.    DIET:  We do recommend a small meal at first, but then you may proceed to your regular diet.  Drink plenty of fluids but you should avoid alcoholic beverages for 24 hours.  ACTIVITY:  You should plan to take it easy for the rest of today and you should NOT DRIVE or use heavy machinery until tomorrow (because of the sedation medicines used during the test).    FOLLOW UP: Our staff will call the number listed on your records the next business day following your procedure.  We will call around 7:15- 8:00 am to check on you and address any questions or concerns that you may have regarding the information given to you following your procedure. If we do not reach you, we will leave a message.     If any biopsies were taken you will be contacted by phone or by letter within the next 1-3 weeks.  Please call us at [phone] if you have not heard about the biopsies in 3 weeks.  SIGNATURES/CONFIDENTIALITY: You and/or your care partner have signed paperwork which will be entered into your electronic medical record.  These signatures attest to the fact that that the information above on your After Visit Summary has been reviewed and is understood.  Full responsibility of the confidentiality of this discharge information lies with you and/or your care-partner.

## 2022-05-31 ENCOUNTER — Telehealth: Payer: Self-pay

## 2022-05-31 ENCOUNTER — Encounter: Payer: Self-pay | Admitting: Gastroenterology

## 2022-05-31 NOTE — Telephone Encounter (Signed)
  Follow up Call-     05/30/2022    2:20 PM  Call back number  Post procedure Call Back phone  # [phone]  Permission to leave phone message Yes     Patient questions:  Do you have a fever, pain , or abdominal swelling? No. Pain Score  0 *  Have you tolerated food without any problems? Yes.    Have you been able to return to your normal activities? Yes.    Do you have any questions about your discharge instructions: Diet   No. Medications  No. Follow up visit  No.  Do you have questions or concerns about your Care? No.  Actions: * If pain score is 4 or above: No action needed, pain <4.

## 2022-06-12 ENCOUNTER — Encounter: Payer: Self-pay | Admitting: Gastroenterology

## 2022-06-22 ENCOUNTER — Telehealth (INDEPENDENT_AMBULATORY_CARE_PROVIDER_SITE_OTHER): Payer: PPO | Admitting: Internal Medicine

## 2022-06-22 ENCOUNTER — Encounter: Payer: Self-pay | Admitting: Internal Medicine

## 2022-06-22 DIAGNOSIS — J019 Acute sinusitis, unspecified: Secondary | ICD-10-CM

## 2022-06-22 DIAGNOSIS — B9689 Other specified bacterial agents as the cause of diseases classified elsewhere: Secondary | ICD-10-CM | POA: Diagnosis not present

## 2022-06-22 MED ORDER — DOXYCYCLINE HYCLATE 100 MG PO TABS: 100.0000 mg | ORAL_TABLET | Freq: Two times a day (BID) | ORAL | 0 refills | Status: AC

## 2022-06-22 NOTE — Progress Notes (Signed)
Virtual Visit via Video Note  I connected with Jennifer Sandoval on 06/22/22 at  2:40 PM EDT by a video enabled telemedicine application and verified that I am speaking with the correct person using two identifiers.   I discussed the limitations of evaluation and management by telemedicine and the availability of in person appointments. The patient expressed understanding and agreed to proceed.  Present for the visit:  Myself, Dr Cheryll Cockayne, Jennifer Sandoval.  The patient is currently at home and I am in the office.    No referring provider.    History of Present Illness: She is here for an acute visit for cold symptoms.   Her symptoms started about one week ago  She has been experiencing allergy symptoms and felt  like she had a sinus infection coming on.  Her symptoms of gotten worse and she is very confident she has a sinus infection.  She does have a long history of sinus infections.  She states nasal congestion with discolored-green-colored mucus, sinus pain, sore throat, postnasal drainage and hoarseness.  She has been coughing and occasionally is coughing up mucus.  She has had headaches, dizziness.  She has tried taking mucinex   Review of Systems  Constitutional:  Negative for fever.  HENT:  Positive for congestion (green colored mucus), sinus pain and sore throat. Negative for ear pain.        PND, hoarseness  Respiratory:  Positive for cough. Negative for shortness of breath and wheezing.   Gastrointestinal:  Negative for diarrhea and nausea.  Neurological:  Positive for dizziness and headaches.      Social History   Socioeconomic History   Marital status: Married    Spouse name: Not on file   Number of children: Not on file   Years of education: Not on file   Highest education level: Not on file  Occupational History   Not on file  Tobacco Use   Smoking status: Never   Smokeless tobacco: Never  Vaping Use   Vaping Use: Never used  Substance and Sexual Activity    Alcohol use: Yes    Comment: special occasion   Drug use: No   Sexual activity: Yes    Partners: Male  Other Topics Concern   Not on file  Social History Narrative   Not on file   Social Determinants of Health   Financial Resource Strain: Low Risk  (03/25/2022)   Overall Financial Resource Strain (CARDIA)    Difficulty of Paying Living Expenses: Not very hard  Food Insecurity: No Food Insecurity (03/25/2022)   Hunger Vital Sign    Worried About Running Out of Food in the Last Year: Never true    Ran Out of Food in the Last Year: Never true  Transportation Needs: No Transportation Needs (03/25/2022)   PRAPARE - Administrator, Civil Service (Medical): No    Lack of Transportation (Non-Medical): No  Physical Activity: Insufficiently Active (03/25/2022)   Exercise Vital Sign    Days of Exercise per Week: 4 days    Minutes of Exercise per Session: 20 min  Stress: No Stress Concern Present (03/25/2022)   [employer] of Occupational Health - Occupational Stress Questionnaire    Feeling of Stress : Only a little  Social Connections: Socially Integrated (03/25/2022)   Social Connection and Isolation Panel [NHANES]    Frequency of Communication with Friends and Family: More than three times a week    Frequency of Social Gatherings with Friends and  Family: Once a week    Attends Religious Services: More than 4 times per year    Active Member of Clubs or Organizations: Yes    Attends Engineer, structural: More than 4 times per year    Marital Status: Married     Observations/Objective: Appears well in NAD Breathing normally Mild hoarseness Skin appears warm and dry   Assessment and Plan:  Acute sinus infection: Acute Likely bacterial-has a long history of sinus infections and she is very confident her symptoms are consistent with a sinus infection Start doxycycline 100 mg BID x 14 day-she has a long history of sinus infections and states that typically  does require 14 days. She was concerned about the medication upsetting her stomach because she had gastritis earlier this year, but is not having any symptoms and is currently on pantoprazole so I do not anticipate any stomach issues, but if she does have any she will call otc cold medications Rest, fluid Call if no improvement    Follow Up Instructions:    I discussed the assessment and treatment plan with the patient. The patient was provided an opportunity to ask questions and all were answered. The patient agreed with the plan and demonstrated an understanding of the instructions.   The patient was advised to call back or seek an in-person evaluation if the symptoms worsen or if the condition fails to improve as anticipated.    Pincus Sanes, MD

## 2022-06-24 NOTE — Assessment & Plan Note (Signed)
She is on Sertraline 150 and is managing a very complex family situation. One of her daughter's lives at home with her 66 year old triplets and she also has custody of her 81 month old granddaughter of her other daughter. She has undergone counseling and has good support through her church.

## 2022-06-24 NOTE — Assessment & Plan Note (Signed)
Increase leafy greens, consider increased lean red meat and using cast iron cookware. Continue to monitor, report any concerns 

## 2022-06-24 NOTE — Progress Notes (Unsigned)
Subjective:    Patient ID: Jennifer Sandoval, female    DOB: 01/30/1955, 67 y.o.   MRN: [id number]  No chief complaint on file.   HPI Discussed the use of AI scribe software for clinical note transcription with the patient, who gave verbal consent to proceed.  History of Present Illness   Patient is a 67 yo female she is here for follow up on chronic medical concerns. No recent febrile illness or hospitalizations. Denies CP/palp/SOB/HA/congestion/fevers/GI or GU c/o. Taking meds as prescribed. Denies CP/palp/SOB/HA/congestion/fevers/GI or GU c/o. 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   Dry eyes, bilateral 01/30/2016   Dyspepsia 04/19/2010   FRACTURE, NOSE 10/22/2007   GERD (gastroesophageal reflux disease)    History of gestational diabetes 01/30/2016   Insomnia 01/23/2012   Leiomyoma of uterus, unspecified 02/15/2010   Lumbar compression fracture (HCC)    nonsurgical   OSTEOARTHRITIS 10/24/2006   Osteoporosis    PERIMENOPAUSAL STATUS 03/16/2010   Post-menopause 03/16/2010   Qualifier: Diagnosis of  By: Abner Greenspan MD, Uzoma Vivona     Preventative health care 08/31/2012   SHOULDER PAIN, BILATERAL 10/10/2007    Past Surgical History:  Procedure Laterality Date   ABDOMINAL HYSTERECTOMY  01/02/2007   partial, ovaries left in place, for painful, heavy menstrrual bleeding secondary to anemia and fibroids   APPENDECTOMY     CESAREAN SECTION     X 3   CHOLECYSTECTOMY     COLONOSCOPY     GASTRIC BYPASS      HERNIA REPAIR     KNEE SURGERY Right    2018 or 2019   NASAL SINUS SURGERY     SEPTOPLASTY     skin biopsy     L hip BCC, facial lesions were benign   TUBAL LIGATION     ventral herniorrhaphy     X 2    Family History  Problem Relation Age of Onset   Cholelithiasis Mother    Other Mother        Urinary incontince/ bladder prolapse/ h/o smoking   Cancer Mother        laryngeal carcinoma   Allergies Mother    Arthritis Mother        s/p knee and shoulder replacement   Dementia Mother        lewy body   Other Father        Renal failure   Hypertension Father    Coronary artery disease Father        s/p bypass   Aortic aneurysm Father    Hyperlipidemia Father    Heart disease Father 61       quadruple bypass   Spina bifida Brother        with stunt/ self cath   Seizures Brother        disorders   Colon cancer Maternal Grandmother    Cancer Maternal Grandmother  colon/ smoker   Cancer Maternal Grandfather        lung   Cancer Paternal Grandmother        Breast   Heart disease Paternal Grandfather    Stroke Paternal Grandfather    ADD / ADHD Daughter    ADD / ADHD Daughter    Depression Daughter        major depressive, anxiety, secondary psychosis   Rectal cancer Neg Hx    Stomach cancer Neg Hx    Esophageal cancer Neg Hx     Social History   Socioeconomic History   Marital status: Married    Spouse name: Not on file   Number of children: Not on file   Years of education: Not on file   Highest education level: Not on file  Occupational History   Not on file  Tobacco Use   Smoking status: Never   Smokeless tobacco: Never  Vaping Use   Vaping Use: Never used  Substance and Sexual Activity   Alcohol use: Yes    Comment: special occasion   Drug use: No   Sexual activity: Yes    Partners: Male  Other Topics Concern   Not on file  Social History Narrative   Not on file   Social Determinants of Health   Financial Resource Strain: Low Risk   (03/25/2022)   Overall Financial Resource Strain (CARDIA)    Difficulty of Paying Living Expenses: Not very hard  Food Insecurity: No Food Insecurity (03/25/2022)   Hunger Vital Sign    Worried About Running Out of Food in the Last Year: Never true    Ran Out of Food in the Last Year: Never true  Transportation Needs: No Transportation Needs (03/25/2022)   PRAPARE - Administrator, Civil Service (Medical): No    Lack of Transportation (Non-Medical): No  Physical Activity: Insufficiently Active (03/25/2022)   Exercise Vital Sign    Days of Exercise per Week: 4 days    Minutes of Exercise per Session: 20 min  Stress: No Stress Concern Present (03/25/2022)   [employer] of Occupational Health - Occupational Stress Questionnaire    Feeling of Stress : Only a little  Social Connections: Socially Integrated (03/25/2022)   Social Connection and Isolation Panel [NHANES]    Frequency of Communication with Friends and Family: More than three times a week    Frequency of Social Gatherings with Friends and Family: Once a week    Attends Religious Services: More than 4 times per year    Active Member of [employer] or Organizations: Yes    Attends Engineer, structural: More than 4 times per year    Marital Status: Married  Catering manager Violence: Not At Risk (03/26/2022)   Humiliation, Afraid, Rape, and Kick questionnaire    Fear of Current or Ex-Partner: No    Emotionally Abused: No    Physically Abused: No    Sexually Abused: No    Outpatient Medications Prior to Visit  Medication Sig Dispense Refill   doxycycline (VIBRA-TABS) 100 MG tablet Take 1 tablet (100 mg total) by mouth 2 (two) times daily for 14 days. 28 tablet 0   famotidine (PEPCID) 20 MG tablet Take 1 tablet (20 mg total) by mouth daily at 2 PM. Take 20 mg once daily in the evening. 90 tablet 1   fluticasone (FLONASE) 50 MCG/ACT nasal spray Place into both nostrils daily.     Iron, Ferrous Sulfate, 325 (65 Fe)  MG  TABS Take 325 mg by mouth daily. (Patient not taking: Reported on 05/30/2022) 30 tablet 3   Multiple Vitamin (MULTIVITAMIN WITH MINERALS) TABS tablet Take 1 tablet by mouth daily. 100 tablet 0   pantoprazole (PROTONIX) 40 MG tablet Take 1 tablet (40 mg total) by mouth in the morning. Take before breakfast. 90 tablet 1   sertraline (ZOLOFT) 100 MG tablet Take 2 tablets (200 mg total) by mouth daily. 180 tablet 1   sucralfate (CARAFATE) 1 GM/10ML suspension Take 10 mLs (1 g total) by mouth 4 (four) times daily. For 2 weeks. 560 mL 0   No facility-administered medications prior to visit.    Allergies  Allergen Reactions   Itraconazole     REACTION: Rash   Levaquin [Levofloxacin In D5w]     Joint pain    Review of Systems  Constitutional:  Negative for fever and malaise/fatigue.  HENT:  Negative for congestion.   Eyes:  Negative for blurred vision.  Respiratory:  Negative for shortness of breath.   Cardiovascular:  Negative for chest pain, palpitations and leg swelling.  Gastrointestinal:  Negative for abdominal pain, blood in stool and nausea.  Genitourinary:  Negative for dysuria and frequency.  Musculoskeletal:  Negative for falls.  Skin:  Negative for rash.  Neurological:  Negative for dizziness, loss of consciousness and headaches.  Endo/Heme/Allergies:  Negative for environmental allergies.  Psychiatric/Behavioral:  Negative for depression. The patient is nervous/anxious and has insomnia.        Objective:    Physical Exam Constitutional:      General: She is not in acute distress.    Appearance: Normal appearance. She is well-developed. She is not toxic-appearing.  HENT:     Head: Normocephalic and atraumatic.     Right Ear: External ear normal.     Left Ear: External ear normal.     Nose: Nose normal.  Eyes:     General:        Right eye: No discharge.        Left eye: No discharge.     Conjunctiva/sclera: Conjunctivae normal.  Neck:     Thyroid: No  thyromegaly.  Cardiovascular:     Rate and Rhythm: Normal rate and regular rhythm.     Heart sounds: Normal heart sounds. No murmur heard. Pulmonary:     Effort: Pulmonary effort is normal. No respiratory distress.     Breath sounds: Normal breath sounds.  Abdominal:     General: Bowel sounds are normal.     Palpations: Abdomen is soft.     Tenderness: There is no abdominal tenderness. There is no guarding.  Musculoskeletal:        General: Normal range of motion.     Cervical back: Neck supple.  Lymphadenopathy:     Cervical: No cervical adenopathy.  Skin:    General: Skin is warm and dry.  Neurological:     Mental Status: She is alert and oriented to person, place, and time.  Psychiatric:        Mood and Affect: Mood normal.        Behavior: Behavior normal.        Thought Content: Thought content normal.        Judgment: Judgment normal.     There were no vitals taken for this visit. Wt Readings from Last 3 Encounters:  05/30/22 121 lb (54.9 kg)  04/24/22 121 lb 12.8 oz (55.2 kg)  03/20/22 119 lb 6.4 oz (54.2 kg)  Diabetic Foot Exam - Simple   No data filed    Lab Results  Component Value Date   WBC 7.2 03/20/2022   HGB 11.9 (L) 03/20/2022   HCT 36.5 03/20/2022   PLT 266.0 03/20/2022   GLUCOSE 94 03/20/2022   CHOL 132 01/30/2016   TRIG 95.0 01/30/2016   HDL 67.70 01/30/2016   LDLCALC 45 01/30/2016   ALT 14 03/20/2022   AST 21 03/20/2022   NA 142 03/20/2022   K 4.3 03/20/2022   CL 104 03/20/2022   CREATININE 0.62 03/20/2022   BUN 14 03/20/2022   CO2 29 03/20/2022   TSH 1.02 03/20/2022   INR 1.4 (H) 01/07/2022   HGBA1C 6.0 10/10/2011    Lab Results  Component Value Date   TSH 1.02 03/20/2022   Lab Results  Component Value Date   WBC 7.2 03/20/2022   HGB 11.9 (L) 03/20/2022   HCT 36.5 03/20/2022   MCV 87.8 03/20/2022   PLT 266.0 03/20/2022   Lab Results  Component Value Date   NA 142 03/20/2022   K 4.3 03/20/2022   CO2 29 03/20/2022    GLUCOSE 94 03/20/2022   BUN 14 03/20/2022   CREATININE 0.62 03/20/2022   BILITOT 0.3 03/20/2022   ALKPHOS 110 03/20/2022   AST 21 03/20/2022   ALT 14 03/20/2022   PROT 6.2 03/20/2022   ALBUMIN 3.9 03/20/2022   CALCIUM 9.2 03/20/2022   ANIONGAP 9 01/13/2022   GFR 93.42 03/20/2022   Lab Results  Component Value Date   CHOL 132 01/30/2016   Lab Results  Component Value Date   HDL 67.70 01/30/2016   Lab Results  Component Value Date   LDLCALC 45 01/30/2016   Lab Results  Component Value Date   TRIG 95.0 01/30/2016   Lab Results  Component Value Date   CHOLHDL 2 01/30/2016   Lab Results  Component Value Date   HGBA1C 6.0 10/10/2011       Assessment & Plan:  Depression with anxiety Assessment & Plan: She is on Sertraline 150 and is managing a very complex family situation. One of her daughter's lives at home with her 70 year old triplets and she also has custody of her 58 month old granddaughter of her other daughter. She has undergone counseling and has good support through her church.   Iron deficiency anemia, unspecified iron deficiency anemia type Assessment & Plan: Increase leafy greens, consider increased lean red meat and using cast iron cookware. Continue to monitor, report any concerns      Assessment and Plan              Danise Edge, MD

## 2022-06-25 ENCOUNTER — Ambulatory Visit (INDEPENDENT_AMBULATORY_CARE_PROVIDER_SITE_OTHER): Payer: PPO | Admitting: Family Medicine

## 2022-06-25 ENCOUNTER — Encounter: Payer: Self-pay | Admitting: Family Medicine

## 2022-06-25 VITALS — BP 122/70 | HR 77 | Temp 97.5°F | Resp 16 | Ht 62.0 in | Wt 125.6 lb

## 2022-06-25 DIAGNOSIS — R42 Dizziness and giddiness: Secondary | ICD-10-CM | POA: Diagnosis not present

## 2022-06-25 DIAGNOSIS — J019 Acute sinusitis, unspecified: Secondary | ICD-10-CM | POA: Diagnosis not present

## 2022-06-25 DIAGNOSIS — D509 Iron deficiency anemia, unspecified: Secondary | ICD-10-CM

## 2022-06-25 DIAGNOSIS — B9689 Other specified bacterial agents as the cause of diseases classified elsewhere: Secondary | ICD-10-CM

## 2022-06-25 DIAGNOSIS — R002 Palpitations: Secondary | ICD-10-CM

## 2022-06-25 DIAGNOSIS — F418 Other specified anxiety disorders: Secondary | ICD-10-CM | POA: Diagnosis not present

## 2022-06-25 DIAGNOSIS — R1013 Epigastric pain: Secondary | ICD-10-CM | POA: Diagnosis not present

## 2022-06-25 LAB — COMPREHENSIVE METABOLIC PANEL
ALT: 12 U/L (ref 0–35)
AST: 19 U/L (ref 0–37)
Albumin: 4.1 g/dL (ref 3.5–5.2)
Alkaline Phosphatase: 108 U/L (ref 39–117)
BUN: 16 mg/dL (ref 6–23)
CO2: 30 mEq/L (ref 19–32)
Calcium: 9.1 mg/dL (ref 8.4–10.5)
Chloride: 103 mEq/L (ref 96–112)
Creatinine, Ser: 0.69 mg/dL (ref 0.40–1.20)
GFR: 90.88 mL/min (ref >60.00)
Glucose, Bld: 85 mg/dL (ref 70–99)
Potassium: 4.3 mEq/L (ref 3.5–5.1)
Sodium: 140 mEq/L (ref 135–145)
Total Bilirubin: 0.4 mg/dL (ref 0.2–1.2)
Total Protein: 6.4 g/dL (ref 6.0–8.3)

## 2022-06-25 LAB — CBC WITH DIFFERENTIAL/PLATELET
Basophils Absolute: 0.1 10*3/uL (ref 0.0–0.1)
Basophils Relative: 0.8 % (ref 0.0–3.0)
Eosinophils Absolute: 0.2 10*3/uL (ref 0.0–0.7)
Eosinophils Relative: 2.4 % (ref 0.0–5.0)
HCT: 35.4 % — ABNORMAL LOW (ref 36.0–46.0)
Hemoglobin: 11.1 g/dL — ABNORMAL LOW (ref 12.0–15.0)
Lymphocytes Relative: 27 % (ref 12.0–46.0)
Lymphs Abs: 2 10*3/uL (ref 0.7–4.0)
MCHC: 31.4 g/dL (ref 30.0–36.0)
MCV: 83.9 fl (ref 78.0–100.0)
Monocytes Absolute: 0.4 10*3/uL (ref 0.1–1.0)
Monocytes Relative: 6.1 % (ref 3.0–12.0)
Neutro Abs: 4.6 10*3/uL (ref 1.4–7.7)
Neutrophils Relative %: 63.7 % (ref 43.0–77.0)
Platelets: 278 10*3/uL (ref 150.0–400.0)
RBC: 4.22 Mil/uL (ref 3.87–5.11)
RDW: 16.3 % — ABNORMAL HIGH (ref 11.5–15.5)
WBC: 7.2 10*3/uL (ref 4.0–10.5)

## 2022-06-25 LAB — TSH: TSH: 0.91 u[IU]/mL (ref 0.35–5.50)

## 2022-06-25 NOTE — Patient Instructions (Addendum)
Pendulum probiotic and NOW probiotic every other day  Sinus Infection, Adult A sinus infection, also called sinusitis, is inflammation of your sinuses. Sinuses are hollow spaces in the bones around your face. Your sinuses are located: Around your eyes. In the middle of your forehead. Behind your nose. In your cheekbones. Mucus normally drains out of your sinuses. When your nasal tissues become inflamed or swollen, mucus can become trapped or blocked. This allows bacteria, viruses, and fungi to grow, which leads to infection. Most infections of the sinuses are caused by a virus. A sinus infection can develop quickly. It can last for up to 4 weeks (acute) or for more than 12 weeks (chronic). A sinus infection often develops after a cold. What are the causes? This condition is caused by anything that creates swelling in the sinuses or stops mucus from draining. This includes: Allergies. Asthma. Infection from bacteria or viruses. Deformities or blockages in your nose or sinuses. Abnormal growths in the nose (nasal polyps). Pollutants, such as chemicals or irritants in the air. Infection from fungi. This is rare. What increases the risk? You are more likely to develop this condition if you: Have a weak body defense system (immune system). Do a lot of swimming or diving. Overuse nasal sprays. Smoke. What are the signs or symptoms? The main symptoms of this condition are pain and a feeling of pressure around the affected sinuses. Other symptoms include: Stuffy nose or congestion that makes it difficult to breathe through your nose. Thick yellow or greenish drainage from your nose. Tenderness, swelling, and warmth over the affected sinuses. A cough that may get worse at night. Decreased sense of smell and taste. Extra mucus that collects in the throat or the back of the nose (postnasal drip) causing a sore throat or bad breath. Tiredness (fatigue). Fever. How is this diagnosed? This  condition is diagnosed based on: Your symptoms. Your medical history. A physical exam. Tests to find out if your condition is acute or chronic. This may include: Checking your nose for nasal polyps. Viewing your sinuses using a device that has a light (endoscope). Testing for allergies or bacteria. Imaging tests, such as an MRI or CT scan. In rare cases, a bone biopsy may be done to rule out more serious types of fungal sinus disease. How is this treated? Treatment for a sinus infection depends on the cause and whether your condition is chronic or acute. If caused by a virus, your symptoms should go away on their own within 10 days. You may be given medicines to relieve symptoms. They include: Medicines that shrink swollen nasal passages (decongestants). A spray that eases inflammation of the nostrils (topical intranasal corticosteroids). Rinses that help get rid of thick mucus in your nose (nasal saline washes). Medicines that treat allergies (antihistamines). Over-the-counter pain relievers. If caused by bacteria, your health care provider may recommend waiting to see if your symptoms improve. Most bacterial infections will get better without antibiotic medicine. You may be given antibiotics if you have: A severe infection. A weak immune system. If caused by narrow nasal passages or nasal polyps, surgery may be needed. Follow these instructions at home: Medicines Take, use, or apply over-the-counter and prescription medicines only as told by your health care provider. These may include nasal sprays. If you were prescribed an antibiotic medicine, take it as told by your health care provider. Do not stop taking the antibiotic even if you start to feel better. Hydrate and humidify  Drink enough fluid to  keep your urine pale yellow. Staying hydrated will help to thin your mucus. Use a cool mist humidifier to keep the humidity level in your home above 50%. Inhale steam for 10-15 minutes,  3-4 times a day, or as told by your health care provider. You can do this in the bathroom while a hot shower is running. Limit your exposure to cool or dry air. Rest Rest as much as possible. Sleep with your head raised (elevated). Make sure you get enough sleep each night. General instructions  Apply a warm, moist washcloth to your face 3-4 times a day or as told by your health care provider. This will help with discomfort. Use nasal saline washes as often as told by your health care provider. Wash your hands often with soap and water to reduce your exposure to germs. If soap and water are not available, use hand sanitizer. Do not smoke. Avoid being around people who are smoking (secondhand smoke). Keep all follow-up visits. This is important. Contact a health care provider if: You have a fever. Your symptoms get worse. Your symptoms do not improve within 10 days. Get help right away if: You have a severe headache. You have persistent vomiting. You have severe pain or swelling around your face or eyes. You have vision problems. You develop confusion. Your neck is stiff. You have trouble breathing. These symptoms may be an emergency. Get help right away. Call 911. Do not wait to see if the symptoms will go away. Do not drive yourself to the hospital. Summary A sinus infection is soreness and inflammation of your sinuses. Sinuses are hollow spaces in the bones around your face. This condition is caused by nasal tissues that become inflamed or swollen. The swelling traps or blocks the flow of mucus. This allows bacteria, viruses, and fungi to grow, which leads to infection. If you were prescribed an antibiotic medicine, take it as told by your health care provider. Do not stop taking the antibiotic even if you start to feel better. Keep all follow-up visits. This is important. This information is not intended to replace advice given to you by your health care provider. Make sure you  discuss any questions you have with your health care provider. Document Revised: 11/22/2020 Document Reviewed: 11/22/2020 Elsevier Patient Education  2024 [employer].

## 2022-06-26 LAB — IRON,TIBC AND FERRITIN PANEL
%SAT: 10 % (calc) — ABNORMAL LOW (ref 16–45)
Ferritin: 7 ng/mL — ABNORMAL LOW (ref 16–288)
Iron: 34 ug/dL — ABNORMAL LOW (ref 45–160)
TIBC: 340 mcg/dL (calc) (ref 250–450)

## 2022-06-29 ENCOUNTER — Encounter: Payer: Self-pay | Admitting: Family Medicine

## 2022-06-29 ENCOUNTER — Other Ambulatory Visit: Payer: Self-pay | Admitting: Family Medicine

## 2022-06-29 MED ORDER — MECLIZINE HCL 25 MG PO TABS: 25.0000 mg | ORAL_TABLET | Freq: Three times a day (TID) | ORAL | 0 refills | Status: DC | PRN

## 2022-07-24 ENCOUNTER — Other Ambulatory Visit: Payer: Self-pay | Admitting: Family Medicine

## 2022-07-24 ENCOUNTER — Encounter: Payer: Self-pay | Admitting: *Deleted

## 2022-07-24 DIAGNOSIS — J069 Acute upper respiratory infection, unspecified: Secondary | ICD-10-CM

## 2022-07-24 DIAGNOSIS — H6522 Chronic serous otitis media, left ear: Secondary | ICD-10-CM

## 2022-08-29 ENCOUNTER — Other Ambulatory Visit: Payer: Self-pay | Admitting: Gastroenterology

## 2022-08-31 ENCOUNTER — Encounter: Payer: Self-pay | Admitting: Family

## 2022-08-31 ENCOUNTER — Telehealth (INDEPENDENT_AMBULATORY_CARE_PROVIDER_SITE_OTHER): Payer: PPO | Admitting: Family

## 2022-08-31 VITALS — Ht 62.0 in

## 2022-08-31 DIAGNOSIS — J019 Acute sinusitis, unspecified: Secondary | ICD-10-CM

## 2022-08-31 MED ORDER — DOXYCYCLINE HYCLATE 100 MG PO TABS: 100.0000 mg | ORAL_TABLET | Freq: Two times a day (BID) | ORAL | 0 refills | Status: AC

## 2022-08-31 NOTE — Progress Notes (Signed)
Jennifer Sandoval is a 67 y.o. female with the following history as recorded in [employer]:  Patient Active Problem List   Diagnosis Date Noted   LUQ pain 03/20/2022   Pansinusitis 10/14/2020   History of gestational diabetes 01/30/2016   H/O gastric bypass 01/30/2016   Dry eyes, bilateral 01/30/2016   Acute bacterial sinusitis 09/29/2014   High serum phosphorus for age 60/27/2015   Palpitations 06/14/2013   Preventative health care 08/31/2012   Insomnia 01/23/2012   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   Epigastric pain 04/19/2010   Post-menopause 03/16/2010   BASAL CELL CARCINOMA SKIN LOWER LIMB INCL HIP 02/15/2010   LEIOMYOMA OF UTERUS, UNSPECIFIED 02/15/2010   FRACTURE, NOSE 10/22/2007   Neck and shoulder pain 10/10/2007   Anemia 10/24/2006   Depression with anxiety 10/24/2006   OSTEOARTHRITIS 10/24/2006   Allergic rhinitis 08/15/2006    Current Outpatient Medications  Medication Sig Dispense Refill   doxycycline (VIBRA-TABS) 100 MG tablet Take 1 tablet (100 mg total) by mouth 2 (two) times daily for 14 days. 28 tablet 0   famotidine (PEPCID) 20 MG tablet TAKE 1 TABLET (20 MG TOTAL) BY MOUTH DAILY AT 2 PM. TAKE 20 MG ONCE DAILY IN THE EVENING. 90 tablet 1   Iron, Ferrous Sulfate, 325 (65 Fe) MG TABS Take 325 mg by mouth daily. 30 tablet 3   meclizine (ANTIVERT) 25 MG tablet Take 1 tablet (25 mg total) by mouth 3 (three) times daily as needed for dizziness. 30 tablet 0   Multiple Vitamin (MULTIVITAMIN WITH MINERALS) TABS tablet Take 1 tablet by mouth daily. 100 tablet 0   pantoprazole (PROTONIX) 40 MG tablet TAKE 1 TABLET (40 MG TOTAL) BY MOUTH IN THE MORNING. TAKE BEFORE BREAKFAST. 90 tablet 1   sertraline (ZOLOFT) 100 MG tablet Take 2 tablets (200 mg total) by mouth daily. 180 tablet 1   fluticasone (FLONASE) 50 MCG/ACT nasal spray Place into both nostrils daily.     sucralfate (CARAFATE) 1 GM/10ML suspension Take 10 mLs (1 g total) by mouth 4 (four) times daily. For 2  weeks. 560 mL 0   No current facility-administered medications for this visit.    Allergies: Itraconazole and Levaquin [levofloxacin in d5w]  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   Dry eyes, bilateral 01/30/2016   Dyspepsia 04/19/2010   FRACTURE, NOSE 10/22/2007   GERD (gastroesophageal reflux disease)    History of gestational diabetes 01/30/2016   Insomnia 01/23/2012   Leiomyoma of uterus, unspecified 02/15/2010   Lumbar compression fracture (HCC)    nonsurgical   OSTEOARTHRITIS 10/24/2006   Osteoporosis    PERIMENOPAUSAL STATUS 03/16/2010   Post-menopause 03/16/2010   Qualifier: Diagnosis of  By: Abner Greenspan MD, Stacey     Preventative health care 08/31/2012   SHOULDER PAIN, BILATERAL 10/10/2007    Past Surgical History:  Procedure Laterality Date   ABDOMINAL HYSTERECTOMY  01/02/2007   partial, ovaries left in place, for painful, heavy menstrrual bleeding secondary to anemia and fibroids  APPENDECTOMY     CESAREAN SECTION     X 3   CHOLECYSTECTOMY     COLONOSCOPY     GASTRIC BYPASS     HERNIA REPAIR     KNEE SURGERY Right    2018 or 2019   NASAL SINUS SURGERY     SEPTOPLASTY     skin biopsy     L hip BCC, facial lesions were benign   TUBAL LIGATION     ventral herniorrhaphy     X 2    Family History  Problem Relation Age of Onset   Cholelithiasis Mother    Other Mother        Urinary incontince/ bladder prolapse/ h/o smoking   Cancer Mother        laryngeal carcinoma   Allergies Mother    Arthritis Mother         s/p knee and shoulder replacement   Dementia Mother        lewy body   Other Father        Renal failure   Hypertension Father    Coronary artery disease Father        s/p bypass   Aortic aneurysm Father    Hyperlipidemia Father    Heart disease Father 35       quadruple bypass   Spina bifida Brother        with stunt/ self cath   Seizures Brother        disorders   Colon cancer Maternal Grandmother    Cancer Maternal Grandmother        colon/ smoker   Cancer Maternal Grandfather        lung   Cancer Paternal Grandmother        Breast   Heart disease Paternal Grandfather    Stroke Paternal Grandfather    ADD / ADHD Daughter    ADD / ADHD Daughter    Depression Daughter        major depressive, anxiety, secondary psychosis   Rectal cancer Neg Hx    Stomach cancer Neg Hx    Esophageal cancer Neg Hx     Social History   Tobacco Use   Smoking status: Never   Smokeless tobacco: Never  Substance Use Topics   Alcohol use: Yes    Comment: special occasion    Subjective:     I connected with Jennifer Sandoval on 08/31/22 at 11:00 AM EDT by a video enabled telemedicine application and verified that I am speaking with the correct person using two identifiers.   I discussed the limitations of evaluation and management by telemedicine and the availability of in person appointments. The patient expressed understanding and agreed to proceed. Provider in office/ patient is at home; provider and patient are only 2 people on video call.   Prone to recurrent sinus infections; notes that has determined symptoms will require 14 day treatment with Doxycycline for complete coverage; no shortness of breath, cough/ congestion;      Objective:  Vitals:   08/31/22 1053  Height: 5\' 2"  (1.575 m)    General: Well developed, well nourished, in no acute distress  Skin : Warm and dry.  Head: Normocephalic and atraumatic  Lungs: Respirations unlabored; Neurologic: Alert and oriented; speech  intact; face symmetrical; moves all extremities well; CNII-XII intact without focal deficit   Assessment:  1. Acute sinusitis, recurrence not specified, unspecified location     Plan:  Rx for Doxycycline 100 mg bid x 14  days as requested; she will plan to follow up in person if symptoms persist.   No follow-ups on file.  No orders of the defined types were placed in this encounter.   Requested Prescriptions   Signed Prescriptions Disp Refills   doxycycline (VIBRA-TABS) 100 MG tablet 28 tablet 0    Sig: Take 1 tablet (100 mg total) by mouth 2 (two) times daily for 14 days.

## 2022-09-06 ENCOUNTER — Ambulatory Visit: Payer: PPO | Admitting: Gastroenterology

## 2022-09-06 ENCOUNTER — Encounter: Payer: Self-pay | Admitting: Gastroenterology

## 2022-09-06 VITALS — BP 106/60 | HR 76 | Ht 62.0 in | Wt 128.2 lb

## 2022-09-06 DIAGNOSIS — K289 Gastrojejunal ulcer, unspecified as acute or chronic, without hemorrhage or perforation: Secondary | ICD-10-CM

## 2022-09-06 NOTE — Patient Instructions (Signed)
You have been scheduled for an endoscopy. Please follow written instructions given to you at your visit today.  If you use inhalers (even only as needed), please bring them with you on the day of your procedure.  If you take any of the following medications, they will need to be adjusted prior to your procedure:   DO NOT TAKE 7 DAYS PRIOR TO TEST- Trulicity (dulaglutide) Ozempic, Wegovy (semaglutide) Mounjaro (tirzepatide) Bydureon Bcise (exanatide extended release)  DO NOT TAKE 1 DAY PRIOR TO YOUR TEST Rybelsus (semaglutide) Adlyxin (lixisenatide) Victoza (liraglutide) Byetta (exanatide) _______________________________________________________  If your blood pressure at your visit was 140/90 or greater, please contact your primary care physician to follow up on this.  _______________________________________________________  If you are age 2 or older, your body mass index should be between 23-30. Your Body mass index is 23.46 kg/m. If this is out of the aforementioned range listed, please consider follow up with your Primary Care Provider.  If you are age 39 or younger, your body mass index should be between 19-25. Your Body mass index is 23.46 kg/m. If this is out of the aformentioned range listed, please consider follow up with your Primary Care Provider.   ________________________________________________________  The [facility] providers would like to encourage you to use [facility] to communicate with providers for non-urgent requests or questions.  Due to long hold times on the telephone, sending your provider a message by [facility] may be a faster and more efficient way to get a response.  Please allow 48 business hours for a response.  Please remember that this is for non-urgent requests.  _______________________________________________________

## 2022-09-06 NOTE — Progress Notes (Signed)
09/06/2022 Jennifer Sandoval [id number] 10-Dec-1955   HISTORY OF PRESENT ILLNESS: This is a 67 year old female with past medical history as listed below, but is fairly limited to osteoporosis, GERD, history of Roux-en-Y, anxiety and depression.  She was seen by one of our other PAs back in April for complaints of epigastric abdominal pain and iron deficiency anemia.  Underwent EGD and colonoscopy by Dr. Lavon Paganini as follows:  EGD 05/30/2022 showed gastrojejunal anastomosis that was ulcerated.    Pathology: 1. Surgical [P], gastric (severe gastritis) REACTIVE GASTROPATHY WITH MINIMAL CHRONIC GASTRITIS NEGATIVE FOR H. PYLORI, INTESTINAL METAPLASIA, DYSPLASIA AND CARCINOMA 2. Surgical [P], gastric anastamosis ulcer CHRONIC ACTIVE GASTRITIS WITH REACTIVE EPITHELIAL CHANGES AND FOCAL SURFACE EROSION FOCAL INTESTINAL METAPLASIA HELICOBACTER STAIN NEGATIVE ([facility], ADEQUATE CONTROL) NEGATIVE FOR DYSPLASIA AND CARCINOMA  Was placed on pantoprazole 40 mg daily and Pepcid 20 mg daily.  She was also given 2 weeks of Carafate.  She is off the Carafate now.  Plan is to repeat EGD and continue the pantoprazole and Pepcid until that time.  Is here today for follow-up and to schedule repeat EGD.  Is feeling well with no complaints.  Colonoscopy 05/30/2022 with only internal hemorrhoids.   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   Dry eyes, bilateral 01/30/2016   Dyspepsia 04/19/2010   FRACTURE, NOSE 10/22/2007   GERD (gastroesophageal reflux disease)    History  of gestational diabetes 01/30/2016   Insomnia 01/23/2012   Leiomyoma of uterus, unspecified 02/15/2010   Lumbar compression fracture (HCC)    nonsurgical   OSTEOARTHRITIS 10/24/2006   Osteoporosis    PERIMENOPAUSAL STATUS 03/16/2010   Post-menopause 03/16/2010   Qualifier: Diagnosis of  By: Abner Greenspan MD, Stacey     Preventative health care 08/31/2012   SHOULDER PAIN, BILATERAL 10/10/2007   Past Surgical History:  Procedure Laterality Date   ABDOMINAL HYSTERECTOMY  01/02/2007   partial, ovaries left in place, for painful, heavy menstrrual bleeding secondary to anemia and fibroids   APPENDECTOMY     CESAREAN SECTION     X 3   CHOLECYSTECTOMY     COLONOSCOPY     GASTRIC BYPASS     HERNIA REPAIR     KNEE SURGERY Right    2018 or 2019   NASAL SINUS SURGERY     SEPTOPLASTY     skin biopsy     L hip BCC, facial lesions were benign   TUBAL LIGATION     ventral herniorrhaphy     X 2    reports that she has never smoked. She has never used smokeless tobacco. She reports current alcohol use. She reports that she does not use drugs. family history includes ADD / ADHD in her daughter and daughter; Allergies in her mother; Aortic aneurysm in her father; Arthritis in her mother; Cancer in her maternal grandfather, maternal grandmother, mother, and paternal grandmother; Cholelithiasis in her mother; Colon cancer in her maternal grandmother; Coronary artery disease in her father; Dementia in her  mother; Depression in her daughter; Heart disease in her paternal grandfather; Heart disease (age of onset: 49) in her father; Hyperlipidemia in her father; Hypertension in her father; Other in her father and mother; Seizures in her brother; Spina bifida in her brother; Stroke in her paternal grandfather. Allergies  Allergen Reactions   Itraconazole     REACTION: Rash   Levaquin [Levofloxacin In D5w]     Joint pain      Outpatient Encounter Medications as of 09/06/2022  Medication Sig   doxycycline  (VIBRA-TABS) 100 MG tablet Take 1 tablet (100 mg total) by mouth 2 (two) times daily for 14 days.   famotidine (PEPCID) 20 MG tablet TAKE 1 TABLET (20 MG TOTAL) BY MOUTH DAILY AT 2 PM. TAKE 20 MG ONCE DAILY IN THE EVENING.   Iron, Ferrous Sulfate, 325 (65 Fe) MG TABS Take 325 mg by mouth daily.   Multiple Vitamin (MULTIVITAMIN WITH MINERALS) TABS tablet Take 1 tablet by mouth daily.   pantoprazole (PROTONIX) 40 MG tablet TAKE 1 TABLET (40 MG TOTAL) BY MOUTH IN THE MORNING. TAKE BEFORE BREAKFAST.   sertraline (ZOLOFT) 100 MG tablet Take 2 tablets (200 mg total) by mouth daily.   No facility-administered encounter medications on file as of 09/06/2022.     REVIEW OF SYSTEMS  : All other systems reviewed and negative except where noted in the History of Present Illness.   PHYSICAL EXAM: BP 106/60 (BP Location: Left Arm, Patient Position: Sitting, Cuff Size: Normal)   Pulse 76   Ht 5\' 2"  (1.575 m)   Wt 128 lb 4 oz (58.2 kg)   BMI 23.46 kg/m  General: Well developed white female in no acute distress Head: Normocephalic and atraumatic Eyes:  Sclerae anicteric, conjunctiva pink. Ears: Normal auditory acuity Lungs: Clear throughout to auscultation; no W/R/R. Heart: Regular rate and rhythm; no M/R/G. Abdomen: Soft, non-distended.  BS present.  Minimal upper abdominal TTP. Musculoskeletal: Symmetrical with no gross deformities  Skin: No lesions on visible extremities Extremities: No edema  Neurological: Alert oriented x 4, grossly non-focal Psychological:  Alert and cooperative. Normal mood and affect  ASSESSMENT AND PLAN: *67 year old female with history of Roux-en-Y gastric bypass surgery who underwent EGD in May and found to have ulceration at the GJ anastomosis.  Plan was to take pantoprazole 40 mg daily, Pepcid 20 mg daily ongoing for a while and was given Carafate for couple of weeks.  She is now off the Carafate.  Plan for repeat EGD to assess for healing.  Continue pantoprazole and  pepcid for now.  Scheduled with Dr. Lavon Paganini.  The risks, benefits, and alternatives to EGD were discussed with the patient and she consents to proceed.   CC:  Bradd Canary, MD

## 2022-10-04 ENCOUNTER — Encounter: Payer: Self-pay | Admitting: Family Medicine

## 2022-10-09 ENCOUNTER — Encounter: Payer: Self-pay | Admitting: Gastroenterology

## 2022-10-16 ENCOUNTER — Ambulatory Visit: Payer: PPO

## 2022-10-19 ENCOUNTER — Encounter: Payer: PPO | Admitting: Gastroenterology

## 2022-10-22 ENCOUNTER — Encounter: Payer: Self-pay | Admitting: Gastroenterology

## 2022-10-22 ENCOUNTER — Ambulatory Visit (AMBULATORY_SURGERY_CENTER): Payer: PPO | Admitting: Gastroenterology

## 2022-10-22 VITALS — BP 119/75 | HR 71 | Temp 98.6°F | Resp 17 | Ht 62.0 in | Wt 128.0 lb

## 2022-10-22 DIAGNOSIS — K289 Gastrojejunal ulcer, unspecified as acute or chronic, without hemorrhage or perforation: Secondary | ICD-10-CM

## 2022-10-22 DIAGNOSIS — M1990 Unspecified osteoarthritis, unspecified site: Secondary | ICD-10-CM | POA: Diagnosis not present

## 2022-10-22 DIAGNOSIS — F419 Anxiety disorder, unspecified: Secondary | ICD-10-CM | POA: Diagnosis not present

## 2022-10-22 DIAGNOSIS — Z9884 Bariatric surgery status: Secondary | ICD-10-CM

## 2022-10-22 DIAGNOSIS — F32A Depression, unspecified: Secondary | ICD-10-CM | POA: Diagnosis not present

## 2022-10-22 MED ORDER — SODIUM CHLORIDE 0.9 % IV SOLN: 500.0000 mL | INTRAVENOUS | Status: DC

## 2022-10-22 NOTE — Patient Instructions (Signed)
Discharge instructions given. Normal exam. Resume previous medications No ibuprofen,naproxen,or other nob-steroidal anti-inflammatory drugs. YOU HAD AN ENDOSCOPIC PROCEDURE TODAY AT THE [facility]:   Refer to the procedure report that was given to you for any specific questions about what was found during the examination.  If the procedure report does not answer your questions, please call your gastroenterologist to clarify.  If you requested that your care partner not be given the details of your procedure findings, then the procedure report has been included in a sealed envelope for you to review at your convenience later.  YOU SHOULD EXPECT: Some feelings of bloating in the abdomen. Passage of more gas than usual.  Walking can help get rid of the air that was put into your GI tract during the procedure and reduce the bloating. If you had a lower endoscopy (such as a colonoscopy or flexible sigmoidoscopy) you may notice spotting of blood in your stool or on the toilet paper. If you underwent a bowel prep for your procedure, you may not have a normal bowel movement for a few days.  Please Note:  You might notice some irritation and congestion in your nose or some drainage.  This is from the oxygen used during your procedure.  There is no need for concern and it should clear up in a day or so.  SYMPTOMS TO REPORT IMMEDIATELY:   Following upper endoscopy (EGD)  Vomiting of blood or coffee ground material  New chest pain or pain under the shoulder blades  Painful or persistently difficult swallowing  New shortness of breath  Fever of 100F or higher  Black, tarry-looking stools  For urgent or emergent issues, a gastroenterologist can be reached at any hour by calling [phone]. Do not use MyChart messaging for urgent concerns.    DIET:  We do recommend a small meal at first, but then you may proceed to your regular diet.  Drink plenty of fluids but you should avoid alcoholic  beverages for 24 hours.  ACTIVITY:  You should plan to take it easy for the rest of today and you should NOT DRIVE or use heavy machinery until tomorrow (because of the sedation medicines used during the test).    FOLLOW UP: Our staff will call the number listed on your records the next business day following your procedure.  We will call around 7:15- 8:00 am to check on you and address any questions or concerns that you may have regarding the information given to you following your procedure. If we do not reach you, we will leave a message.     If any biopsies were taken you will be contacted by phone or by letter within the next 1-3 weeks.  Please call us at [phone] if you have not heard about the biopsies in 3 weeks.    SIGNATURES/CONFIDENTIALITY: You and/or your care partner have signed paperwork which will be entered into your electronic medical record.  These signatures attest to the fact that that the information above on your After Visit Summary has been reviewed and is understood.  Full responsibility of the confidentiality of this discharge information lies with you and/or your care-partner.

## 2022-10-22 NOTE — Progress Notes (Unsigned)
[facility] History and Physical   Primary Care Physician:  Bradd Canary, MD   Reason for Procedure:  S/p Roux-en-Y with anastomosis ulcer  Plan:    EGD  with possible interventions as needed     HPI: Jennifer Sandoval is a very pleasant 67 y.o. female here for EGD for evaluation of anastomosis ulcer, document healing and obtain biopsies if needed.   The risks and benefits as well as alternatives of endoscopic procedure(s) have been discussed and reviewed. All questions answered. The patient agrees to proceed.    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   Dry eyes, bilateral 01/30/2016   Dyspepsia 04/19/2010   FRACTURE, NOSE 10/22/2007   GERD (gastroesophageal reflux disease)    History of gestational diabetes 01/30/2016   Insomnia 01/23/2012   Leiomyoma of uterus, unspecified 02/15/2010   Lumbar compression fracture (HCC)    nonsurgical   OSTEOARTHRITIS 10/24/2006   Osteoporosis    PERIMENOPAUSAL STATUS 03/16/2010   Post-menopause 03/16/2010   Qualifier: Diagnosis of  By: Abner Greenspan MD, Stacey     Preventative health care 08/31/2012   SHOULDER PAIN, BILATERAL 10/10/2007    Past Surgical History:  Procedure Laterality Date   ABDOMINAL HYSTERECTOMY  01/02/2007   partial, ovaries left in place, for painful, heavy menstrrual bleeding secondary to anemia and fibroids   APPENDECTOMY     CESAREAN SECTION     X 3   CHOLECYSTECTOMY     COLONOSCOPY     GASTRIC BYPASS     HERNIA REPAIR     KNEE SURGERY Right    2018 or  2019   NASAL SINUS SURGERY     SEPTOPLASTY     skin biopsy     L hip BCC, facial lesions were benign   TUBAL LIGATION     ventral herniorrhaphy     X 2    Prior to Admission medications   Medication Sig Start Date End Date Taking? Authorizing Provider  famotidine (PEPCID) 20 MG tablet TAKE 1 TABLET (20 MG TOTAL) BY MOUTH DAILY AT 2 PM. TAKE 20 MG ONCE DAILY IN THE EVENING. 08/29/22  Yes Delayne Sanzo, Eleonore Chiquito, MD  Iron, Ferrous Sulfate, 325 (65 Fe) MG TABS Take 325 mg by mouth daily. 05/11/22  Yes Bradd Canary, MD  Multiple Vitamin (MULTIVITAMIN WITH MINERALS) TABS tablet Take 1 tablet by mouth daily. 01/14/22  Yes Pokhrel, Laxman, MD  pantoprazole (PROTONIX) 40 MG tablet TAKE 1 TABLET (40 MG TOTAL) BY MOUTH IN THE MORNING. TAKE BEFORE BREAKFAST. 08/29/22  Yes Berdella Bacot, Eleonore Chiquito, MD  sertraline (ZOLOFT) 100 MG tablet Take 2 tablets (200 mg total) by mouth daily. 03/20/22  Yes Bradd Canary, MD    Current Outpatient Medications  Medication Sig Dispense Refill   famotidine (PEPCID) 20 MG tablet TAKE 1 TABLET (20 MG TOTAL) BY MOUTH DAILY AT 2 PM. TAKE 20 MG ONCE DAILY IN THE EVENING. 90 tablet 1   Iron, Ferrous Sulfate, 325 (65 Fe) MG TABS  Take 325 mg by mouth daily. 30 tablet 3   Multiple Vitamin (MULTIVITAMIN WITH MINERALS) TABS tablet Take 1 tablet by mouth daily. 100 tablet 0   pantoprazole (PROTONIX) 40 MG tablet TAKE 1 TABLET (40 MG TOTAL) BY MOUTH IN THE MORNING. TAKE BEFORE BREAKFAST. 90 tablet 1   sertraline (ZOLOFT) 100 MG tablet Take 2 tablets (200 mg total) by mouth daily. 180 tablet 1   Current Facility-Administered Medications  Medication Dose Route Frequency Provider Last Rate Last Admin   0.9 %  sodium chloride infusion  500 mL Intravenous Continuous Dorethia Jeanmarie, Eleonore Chiquito, MD        Allergies as of 10/22/2022 - Review Complete 10/22/2022  Allergen Reaction Noted   Itraconazole     Levaquin [levofloxacin in d5w]  10/10/2011    Family History  Problem Relation Age of  Onset   Cholelithiasis Mother    Other Mother        Urinary incontince/ bladder prolapse/ h/o smoking   Cancer Mother        laryngeal carcinoma   Allergies Mother    Arthritis Mother        s/p knee and shoulder replacement   Dementia Mother        lewy body   Other Father        Renal failure   Hypertension Father    Coronary artery disease Father        s/p bypass   Aortic aneurysm Father    Hyperlipidemia Father    Heart disease Father 3       quadruple bypass   Spina bifida Brother        with stunt/ self cath   Seizures Brother        disorders   Colon cancer Maternal Grandmother    Cancer Maternal Grandmother        colon/ smoker   Cancer Maternal Grandfather        lung   Cancer Paternal Grandmother        Breast   Heart disease Paternal Grandfather    Stroke Paternal Grandfather    ADD / ADHD Daughter    ADD / ADHD Daughter    Depression Daughter        major depressive, anxiety, secondary psychosis   Rectal cancer Neg Hx    Stomach cancer Neg Hx    Esophageal cancer Neg Hx     Social History   Socioeconomic History   Marital status: Married    Spouse name: Not on file   Number of children: Not on file   Years of education: Not on file   Highest education level: Not on file  Occupational History   Not on file  Tobacco Use   Smoking status: Never   Smokeless tobacco: Never  Vaping Use   Vaping status: Never Used  Substance and Sexual Activity   Alcohol use: Yes    Comment: special occasion   Drug use: No   Sexual activity: Yes    Partners: Male  Other Topics Concern   Not on file  Social History Narrative   Not on file   Social Determinants of Health   Financial Resource Strain: Low Risk  (03/25/2022)   Overall Financial Resource Strain (CARDIA)    Difficulty of Paying Living Expenses: Not very hard  Food Insecurity: No Food Insecurity (03/25/2022)   Hunger Vital Sign    Worried About Running Out of Food in the Last Year: Never true  Ran Out of Food in the Last Year: Never true  Transportation Needs: No Transportation Needs (03/25/2022)   PRAPARE - Administrator, Civil Service (Medical): No    Lack of Transportation (Non-Medical): No  Physical Activity: Insufficiently Active (03/25/2022)   Exercise Vital Sign    Days of Exercise per Week: 4 days    Minutes of Exercise per Session: 20 min  Stress: No Stress Concern Present (03/25/2022)   [employer] of Occupational Health - Occupational Stress Questionnaire    Feeling of Stress : Only a little  Social Connections: Socially Integrated (03/25/2022)   Social Connection and Isolation Panel [NHANES]    Frequency of Communication with Friends and Family: More than three times a week    Frequency of Social Gatherings with Friends and Family: Once a week    Attends Religious Services: More than 4 times per year    Active Member of [employer] or Organizations: Yes    Attends Engineer, structural: More than 4 times per year    Marital Status: Married  Catering manager Violence: Not At Risk (03/26/2022)   Humiliation, Afraid, Rape, and Kick questionnaire    Fear of Current or Ex-Partner: No    Emotionally Abused: No    Physically Abused: No    Sexually Abused: No    Review of Systems:  All other review of systems negative except as mentioned in the HPI.  Physical Exam: Vital signs in last 24 hours: BP 127/87   Pulse 77   Temp 98.6 F (37 C)   Ht 5\' 2"  (1.575 m)   Wt 128 lb (58.1 kg)   SpO2 96%   BMI 23.41 kg/m  General:   Alert, NAD Lungs:  Clear .   Heart:  Regular rate and rhythm Abdomen:  Soft, nontender and nondistended. Neuro/Psych:  Alert and cooperative. Normal mood and affect. A and O x 3  Reviewed labs, radiology imaging, old records and pertinent past GI work up  Patient is appropriate for planned procedure(s) and anesthesia in an ambulatory setting   K. Scherry Ran , MD [phone]

## 2022-10-22 NOTE — Progress Notes (Unsigned)
To pacu, VSS. Report to [email or web address] 

## 2022-10-22 NOTE — Op Note (Signed)
[facility] Patient Name: Jennifer Sandoval Procedure Date: 10/22/2022 2:15 PM MRN: [id number] Endoscopist: Napoleon Form , MD, [id number] Age: 67 Referring MD:  Date of Birth: [birth year] Gender: Female Account #: [id number] Procedure:                Upper GI endoscopy Indications:              Follow-up of chronic gastrojejunal ulcer Medicines:                Monitored Anesthesia Care Procedure:                Pre-Anesthesia Assessment:                           - Prior to the procedure, a History and Physical                            was performed, and patient medications and                            allergies were reviewed. The patient's tolerance of                            previous anesthesia was also reviewed. The risks                            and benefits of the procedure and the sedation                            options and risks were discussed with the patient.                            All questions were answered, and informed consent                            was obtained. Prior Anticoagulants: The patient has                            taken no anticoagulant or antiplatelet agents. ASA                            Grade Assessment: II - A patient with mild systemic                            disease. After reviewing the risks and benefits,                            the patient was deemed in satisfactory condition to                            undergo the procedure.                           After obtaining informed consent, the endoscope was  passed under direct vision. Throughout the                            procedure, the patient's blood pressure, pulse, and                            oxygen saturations were monitored continuously. The                            GIF [id number] #[id number] was introduced through the                            mouth, and advanced to the afferent and efferent                            jejunal  loops. The upper GI endoscopy was                            accomplished without difficulty. The patient                            tolerated the procedure well. Scope In: Scope Out: Findings:                 The Z-line was regular and was found 36 cm from the                            incisors.                           The exam of the esophagus was otherwise normal.                           Evidence of a Roux-en-Y gastrojejunostomy was                            found. The gastrojejunal anastomosis was                            characterized by healthy appearing mucosa. This was                            traversed. The pouch-to-jejunum limb was                            characterized by healthy appearing mucosa. The                            jejunojejunal anastomosis was characterized by                            healthy appearing mucosa.                           The examined jejunum was normal. Complications:  No immediate complications. Estimated Blood Loss:     Estimated blood loss was minimal. Impression:               - Z-line regular, 36 cm from the incisors.                           - Roux-en-Y gastrojejunostomy with gastrojejunal                            anastomosis characterized by healthy appearing                            mucosa.                           - Normal examined jejunum.                           - No specimens collected. Recommendation:           - Patient has a contact number available for                            emergencies. The signs and symptoms of potential                            delayed complications were discussed with the                            patient. Return to normal activities tomorrow.                            Written discharge instructions were provided to the                            patient.                           - Resume previous diet.                           - Continue present medications.                            - Follow an antireflux regimen.                           - No ibuprofen, naproxen, or other non-steroidal                            anti-inflammatory drugs. Napoleon Form, MD 10/22/2022 2:29:08 PM This report has been signed electronically.

## 2022-10-23 ENCOUNTER — Telehealth: Payer: Self-pay

## 2022-10-23 NOTE — Telephone Encounter (Signed)
  Follow up Call-     10/22/2022    2:00 PM 05/30/2022    2:20 PM  Call back number  Post procedure Call Back phone  # [phone]  Permission to leave phone message Yes Yes     Patient questions:  Do you have a fever, pain , or abdominal swelling? No. Pain Score  0 *  Have you tolerated food without any problems? Yes.    Have you been able to return to your normal activities? Yes.    Do you have any questions about your discharge instructions: Diet   No. Medications  No. Follow up visit  No.  Do you have questions or concerns about your Care? No.  Actions: * If pain score is 4 or above: No action needed, pain <4.

## 2022-10-26 ENCOUNTER — Other Ambulatory Visit: Payer: Self-pay | Admitting: Physician Assistant

## 2022-10-26 ENCOUNTER — Other Ambulatory Visit: Payer: Self-pay | Admitting: Family Medicine

## 2022-11-16 ENCOUNTER — Telehealth (INDEPENDENT_AMBULATORY_CARE_PROVIDER_SITE_OTHER): Payer: PPO | Admitting: Physician Assistant

## 2022-11-16 DIAGNOSIS — J0111 Acute recurrent frontal sinusitis: Secondary | ICD-10-CM | POA: Diagnosis not present

## 2022-11-16 MED ORDER — DOXYCYCLINE HYCLATE 100 MG PO TABS: 100.0000 mg | ORAL_TABLET | Freq: Two times a day (BID) | ORAL | 0 refills | Status: AC

## 2022-11-16 NOTE — Progress Notes (Unsigned)
    MyChart Video Visit    Virtual Visit via Video Note   This format is felt to be most appropriate for this patient at this time. Physical exam was limited by quality of the video and audio technology used for the visit.   Patient location: home Provider location: [email or web address]  I discussed the limitations of evaluation and management by telemedicine and the availability of in person appointments. The patient expressed understanding and agreed to proceed.  Patient: Jennifer Sandoval   DOB: [birth year]   66 y.o. Female  MRN: [id number] Visit Date: 11/16/2022  Today's healthcare provider: Alfredia Ferguson, PA-C   Cc.  Subjective    HPI   Pt reports headache, cough, nasal congestion, sinus pressure x a few weeks. Denies fevers.  Medications: Outpatient Medications Prior to Visit  Medication Sig   famotidine (PEPCID) 20 MG tablet TAKE 1 TABLET (20 MG TOTAL) BY MOUTH DAILY AT 2 PM. TAKE 20 MG ONCE DAILY IN THE EVENING.   famotidine (PEPCID) 40 MG tablet TAKE 1 TABLET (40 MG TOTAL) BY MOUTH 2 (TWO) TIMES DAILY. TAKE 30 MINUTES PRIOR TO FOOD   Iron, Ferrous Sulfate, 325 (65 Fe) MG TABS Take 325 mg by mouth daily.   Multiple Vitamin (MULTIVITAMIN WITH MINERALS) TABS tablet Take 1 tablet by mouth daily.   pantoprazole (PROTONIX) 40 MG tablet TAKE 1 TABLET (40 MG TOTAL) BY MOUTH IN THE MORNING. TAKE BEFORE BREAKFAST.   sertraline (ZOLOFT) 100 MG tablet Take 2 tablets (200 mg total) by mouth daily. Appt for further refills   No facility-administered medications prior to visit.    Review of Systems  {Insert previous labs (optional):23779} {See past labs  Heme  Chem  Endocrine  Serology  Results Review (optional):1}   Objective    There were no vitals taken for this visit.  {Insert last BP/Wt (optional):23777}{See vitals history (optional):1}    Physical Exam     Assessment & Plan     ***  No follow-ups on file.     I discussed the assessment and treatment plan with the  patient. The patient was provided an opportunity to ask questions and all were answered. The patient agreed with the plan and demonstrated an understanding of the instructions.   The patient was advised to call back or seek an in-person evaluation if the symptoms worsen or if the condition fails to improve as anticipated.  I provided *** minutes of non-face-to-face time during this encounter.  Alfredia Ferguson, PA-C [facility] [phone] (phone) [phone] (fax)  [facility]

## 2022-11-19 ENCOUNTER — Encounter: Payer: Self-pay | Admitting: Physician Assistant

## 2023-01-22 ENCOUNTER — Telehealth: Payer: Self-pay

## 2023-01-22 ENCOUNTER — Other Ambulatory Visit: Payer: Self-pay | Admitting: Family Medicine

## 2023-01-22 MED ORDER — SERTRALINE HCL 100 MG PO TABS: 200.0000 mg | ORAL_TABLET | Freq: Every day | ORAL | 0 refills | Status: DC

## 2023-01-22 NOTE — Telephone Encounter (Signed)
Copied from CRM [id number]. Topic: Clinical - Prescription Issue >> Jan 22, 2023 12:32 PM Theodis Sato wrote: Reason for CRM: Patient is requesting Dr. Abner Greenspan to please sign the re-fill request sent by her pharmacy for sertraline (ZOLOFT) 100 MG tablet

## 2023-01-22 NOTE — Telephone Encounter (Signed)
Rx sent 

## 2023-02-01 ENCOUNTER — Telehealth: Payer: PPO | Admitting: Family Medicine

## 2023-02-01 ENCOUNTER — Encounter: Payer: Self-pay | Admitting: Family Medicine

## 2023-02-01 DIAGNOSIS — J0140 Acute pansinusitis, unspecified: Secondary | ICD-10-CM

## 2023-02-01 MED ORDER — DOXYCYCLINE HYCLATE 100 MG PO TABS: 100.0000 mg | ORAL_TABLET | Freq: Two times a day (BID) | ORAL | 0 refills | Status: DC

## 2023-02-01 NOTE — Progress Notes (Signed)
Established Patient Office Visit I connected with  Jennifer Sandoval on 02/05/23 by a video enabled telemedicine application and verified that I am speaking with the correct person using two identifiers.   I discussed the limitations of evaluation and management by telemedicine. The patient expressed understanding and agreed to proceed.  Subjective   Patient ID: Jennifer Sandoval, female    DOB: 1955/04/29  Age: 68 y.o. MRN: [id number]  Chief Complaint  Patient presents with   Sinus Problem    HPI   Discussed the use of AI scribe software for clinical note transcription with the patient, who gave verbal consent to proceed.  History of Present Illness   The patient presents with a sinus infection.  They are experiencing another sinus infection, which they describe as typical for them. Last night was particularly severe, with a 'really bad headache' around the eyes and significant drainage in the throat. No fevers have been noted, and they have not taken a COVID or flu test due to a lack of exposure to sick individuals and limited symptoms.  They typically experience sinus infections every few months, with the last occurrence in November. Previous treatments with doxycycline for 14 days have been effective in fully resolving the infection, as a 10-day course tends to result in recurrence.  They have been managing their symptoms with Tylenol for headaches and Mucinex to loosen congestion, which provides some relief but does not fully resolve the issue. They do not find Flonase particularly helpful but use a saline spray, described as a pump type, which aids in washing out the sinuses. Additionally, they are using [facility].       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   Epigastric pain 04/19/2010   Post-menopause 03/16/2010   BASAL CELL CARCINOMA SKIN LOWER LIMB INCL HIP 02/15/2010   LEIOMYOMA OF UTERUS, UNSPECIFIED 02/15/2010   FRACTURE, NOSE 10/22/2007   Neck and shoulder pain 10/10/2007   Anemia 10/24/2006   Depression with anxiety 10/24/2006   OSTEOARTHRITIS 10/24/2006   Allergic rhinitis 08/15/2006   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   Dry eyes, bilateral 01/30/2016   Dyspepsia 04/19/2010   FRACTURE, NOSE 10/22/2007   GERD (gastroesophageal reflux disease)    History of gestational diabetes 01/30/2016   Insomnia 01/23/2012   Leiomyoma of uterus, unspecified 02/15/2010   Lumbar compression fracture (HCC)    nonsurgical   OSTEOARTHRITIS 10/24/2006   Osteoporosis    PERIMENOPAUSAL STATUS 03/16/2010   Post-menopause 03/16/2010   Qualifier: Diagnosis of  By: Abner Greenspan MD, Misty Stanley  Preventative health care 08/31/2012   SHOULDER PAIN, BILATERAL 10/10/2007   Past Surgical History:  Procedure Laterality Date   ABDOMINAL HYSTERECTOMY  01/02/2007   partial, ovaries left in place, for painful, heavy menstrrual bleeding secondary to anemia and fibroids   APPENDECTOMY     CESAREAN SECTION     X 3   CHOLECYSTECTOMY     COLONOSCOPY     GASTRIC BYPASS     HERNIA REPAIR     KNEE SURGERY Right    2018  or 2019   NASAL SINUS SURGERY     SEPTOPLASTY     skin biopsy     L hip BCC, facial lesions were benign   TUBAL LIGATION     ventral herniorrhaphy     X 2   Social History   Tobacco Use   Smoking status: Never   Smokeless tobacco: Never  Vaping Use   Vaping status: Never Used  Substance Use Topics   Alcohol use: Yes    Comment: special occasion   Drug use: No   Social History   Socioeconomic History   Marital status: Married    Spouse name: Not on file   Number of children: Not on file   Years of education: Not on file   Highest education level: Not on file  Occupational History   Not on file  Tobacco Use   Smoking status: Never   Smokeless tobacco: Never  Vaping Use   Vaping status: Never Used  Substance and Sexual Activity   Alcohol use: Yes    Comment: special occasion   Drug use: No   Sexual activity: Yes    Partners: Male  Other Topics Concern   Not on file  Social History Narrative   Not on file   Social Drivers of Health   Financial Resource Strain: Low Risk  (03/25/2022)   Overall Financial Resource Strain (CARDIA)    Difficulty of Paying Living Expenses: Not very hard  Food Insecurity: No Food Insecurity (03/25/2022)   Hunger Vital Sign    Worried About Running Out of Food in the Last Year: Never true    Ran Out of Food in the Last Year: Never true  Transportation Needs: No Transportation Needs (03/25/2022)   PRAPARE - Administrator, Civil Service (Medical): No    Lack of Transportation (Non-Medical): No  Physical Activity: Insufficiently Active (03/25/2022)   Exercise Vital Sign    Days of Exercise per Week: 4 days    Minutes of Exercise per Session: 20 min  Stress: No Stress Concern Present (03/25/2022)   [employer] of Occupational Health - Occupational Stress Questionnaire    Feeling of Stress : Only a little  Social Connections: Socially Integrated (03/25/2022)   Social Connection and Isolation Panel [NHANES]    Frequency  of Communication with Friends and Family: More than three times a week    Frequency of Social Gatherings with Friends and Family: Once a week    Attends Religious Services: More than 4 times per year    Active Member of [employer] or Organizations: Yes    Attends Banker Meetings: More than 4 times per year    Marital Status: Married  Catering manager Violence: Not At Risk (03/26/2022)   Humiliation, Afraid, Rape, and Kick questionnaire    Fear of Current or Ex-Partner: No    Emotionally Abused: No    Physically Abused: No    Sexually Abused: No   Family Status  Relation Name Status   Mother Ann,80 Deceased       Passed away 2015/10/26  Father  Deceased at age 38       h/o smoker   Sister Gaye Alive       54 yrs old/ well   Sister Amy Alive       50 yrs old/ well   Brother Rosanne Ashing Alive       48 yrs old   MGM  Deceased at age 62's   MGF  Deceased at age 45's   PGM  Deceased at age 51's       Breast   PGF  Deceased at age late 67's   Daughter Toula Moos Alive       88 yrs old   Daughter Lexi Alive       79 yrs old   Daughter Magazine features editor Alive       67 yrs old/ well   Neg Hx  (Not Specified)  No partnership data on file   Family History  Problem Relation Age of Onset   Cholelithiasis Mother    Other Mother        Urinary incontince/ bladder prolapse/ h/o smoking   Cancer Mother        laryngeal carcinoma   Allergies Mother    Arthritis Mother        s/p knee and shoulder replacement   Dementia Mother        lewy body   Other Father        Renal failure   Hypertension Father    Coronary artery disease Father        s/p bypass   Aortic aneurysm Father    Hyperlipidemia Father    Heart disease Father 59       quadruple bypass   Spina bifida Brother        with stunt/ self cath   Seizures Brother        disorders   Colon cancer Maternal Grandmother    Cancer Maternal Grandmother        colon/ smoker   Cancer Maternal Grandfather        lung   Cancer  Paternal Grandmother        Breast   Heart disease Paternal Grandfather    Stroke Paternal Grandfather    ADD / ADHD Daughter    ADD / ADHD Daughter    Depression Daughter        major depressive, anxiety, secondary psychosis   Rectal cancer Neg Hx    Stomach cancer Neg Hx    Esophageal cancer Neg Hx    Allergies  Allergen Reactions   Itraconazole     REACTION: Rash   Levaquin [Levofloxacin In D5w]     Joint pain      Review of Systems  Constitutional:  Negative for fever and malaise/fatigue.  HENT:  Negative for congestion.   Eyes:  Negative for blurred vision.  Respiratory:  Negative for cough and shortness of breath.   Cardiovascular:  Negative for chest pain, palpitations and leg swelling.  Gastrointestinal:  Negative for abdominal pain, blood in stool, nausea and vomiting.  Genitourinary:  Negative for dysuria and frequency.  Musculoskeletal:  Negative for back pain and falls.  Skin:  Negative for rash.  Neurological:  Negative for dizziness, loss of consciousness and headaches.  Endo/Heme/Allergies:  Negative for environmental allergies.  Psychiatric/Behavioral:  Negative for depression. The patient is not nervous/anxious.  Objective:     There were no vitals taken for this visit. BP Readings from Last 3 Encounters:  10/22/22 119/75  09/06/22 106/60  06/25/22 122/70   Wt Readings from Last 3 Encounters:  10/22/22 128 lb (58.1 kg)  09/06/22 128 lb 4 oz (58.2 kg)  06/25/22 125 lb 9.6 oz (57 kg)   SpO2 Readings from Last 3 Encounters:  10/22/22 95%  06/25/22 98%  05/30/22 98%      Physical Exam Vitals and nursing note reviewed.  Constitutional:      Appearance: She is well-developed.  Neck:     Thyroid: No thyromegaly or thyroid tenderness.     Vascular: No JVD.  Neurological:     Mental Status: She is alert.     Deep Tendon Reflexes: Reflexes are normal and symmetric.  Psychiatric:        Mood and Affect: Mood normal.        Behavior:  Behavior normal.        Thought Content: Thought content normal.        Judgment: Judgment normal.      No results found for any visits on 02/01/23.  Last CBC Lab Results  Component Value Date   WBC 7.2 06/25/2022   HGB 11.1 (L) 06/25/2022   HCT 35.4 (L) 06/25/2022   MCV 83.9 06/25/2022   MCH 30.1 01/14/2022   RDW 16.3 (H) 06/25/2022   PLT 278.0 06/25/2022   Last metabolic panel Lab Results  Component Value Date   GLUCOSE 85 06/25/2022   NA 140 06/25/2022   K 4.3 06/25/2022   CL 103 06/25/2022   CO2 30 06/25/2022   BUN 16 06/25/2022   CREATININE 0.69 06/25/2022   GFR 90.88 06/25/2022   CALCIUM 9.1 06/25/2022   PHOS 3.5 09/25/2013   PROT 6.4 06/25/2022   ALBUMIN 4.1 06/25/2022   BILITOT 0.4 06/25/2022   ALKPHOS 108 06/25/2022   AST 19 06/25/2022   ALT 12 06/25/2022   ANIONGAP 9 01/13/2022   Last lipids Lab Results  Component Value Date   CHOL 132 01/30/2016   HDL 67.70 01/30/2016   LDLCALC 45 01/30/2016   TRIG 95.0 01/30/2016   CHOLHDL 2 01/30/2016   Last hemoglobin A1c Lab Results  Component Value Date   HGBA1C 6.0 10/10/2011   Last thyroid functions Lab Results  Component Value Date   TSH 0.91 06/25/2022   Last vitamin D Lab Results  Component Value Date   VD25OH 69.10 01/30/2016   Last vitamin B12 and Folate Lab Results  Component Value Date   VITAMINB12 905 01/10/2022   FOLATE >23.4 01/30/2016      The ASCVD Risk score (Arnett DK, et al., 2019) failed to calculate for the following reasons:   Cannot find a previous HDL lab   Cannot find a previous total cholesterol lab    Assessment & Plan:   Problem List Items Addressed This Visit       Unprioritized   Pansinusitis - Primary   Relevant Medications   doxycycline (VIBRA-TABS) 100 MG tablet  Assessment and Plan    Recurrent Sinusitis Recurrent sinusitis presents with headache, periorbital pain, and nasal drainage, but no fever. The last episode occurred in November, with  infections typically every few months. Previous 14-day doxycycline treatment has been effective, while a 10-day course was less so. No recent COVID-19 or flu exposure. Prescribe doxycycline for 14 days and advise using saline nasal spray for symptom relief. Instruct to call if symptoms do not  improve.        No follow-ups on file.    Donato Schultz, DO

## 2023-04-04 ENCOUNTER — Encounter: Payer: Self-pay | Admitting: Physician Assistant

## 2023-04-04 ENCOUNTER — Ambulatory Visit (INDEPENDENT_AMBULATORY_CARE_PROVIDER_SITE_OTHER): Admitting: Physician Assistant

## 2023-04-04 ENCOUNTER — Ambulatory Visit: Payer: Self-pay

## 2023-04-04 VITALS — BP 128/80 | HR 81 | Temp 99.0°F | Ht 62.0 in | Wt 132.0 lb

## 2023-04-04 DIAGNOSIS — J0111 Acute recurrent frontal sinusitis: Secondary | ICD-10-CM | POA: Diagnosis not present

## 2023-04-04 MED ORDER — DOXYCYCLINE HYCLATE 100 MG PO TABS: 100.0000 mg | ORAL_TABLET | Freq: Two times a day (BID) | ORAL | 0 refills | Status: DC

## 2023-04-04 NOTE — Progress Notes (Signed)
 Jennifer Sandoval is a 68 y.o. female here for a new problem.  History of Present Illness:   Chief Complaint  Patient presents with   Sinus Problem    Pt c/o sinus issues x several weeks, worse past week, nasal congestion yellow drainage, cough expectorating green/yellow, chest hurts when she coughs. Hx Pneumonia     Sinus Infection  Patient complains of a sinus infection that has persisted for the past several weeks. Associated symptoms include coughs, yellow nasal drainage, green sputum production with symptoms worsening over the past week. She has been feeling more fatigued and has a mild appetite.   Patient states that the current weather is also worsening her symptoms.  Typically takes Mucinex and Flonase with minimal relief.  Patient also reports a history of pneumonia and recurrent sinus infections.  Denies any ear pain.  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   Dry eyes, bilateral 01/30/2016   Dyspepsia 04/19/2010   FRACTURE, NOSE 10/22/2007   GERD (gastroesophageal reflux disease)    History of gestational diabetes 01/30/2016   Insomnia 01/23/2012   Leiomyoma of uterus, unspecified 02/15/2010   Lumbar compression fracture (HCC)    nonsurgical   OSTEOARTHRITIS 10/24/2006   Osteoporosis    PERIMENOPAUSAL STATUS 03/16/2010   Post-menopause 03/16/2010   Qualifier: Diagnosis of  By: Abner Greenspan MD, Stacey     Preventative health care 08/31/2012   SHOULDER PAIN, BILATERAL 10/10/2007     Social History   Tobacco Use   Smoking status:  Never   Smokeless tobacco: Never  Vaping Use   Vaping status: Never Used  Substance Use Topics   Alcohol use: Yes    Comment: special occasion   Drug use: No    Past Surgical History:  Procedure Laterality Date   ABDOMINAL HYSTERECTOMY  01/02/2007   partial, ovaries left in place, for painful, heavy menstrrual bleeding secondary to anemia and fibroids   APPENDECTOMY     CESAREAN SECTION     X 3   CHOLECYSTECTOMY     COLONOSCOPY     GASTRIC BYPASS     HERNIA REPAIR     KNEE SURGERY Right    2018 or 2019   NASAL SINUS SURGERY     SEPTOPLASTY     skin biopsy     L hip BCC, facial lesions were benign   TUBAL LIGATION     ventral herniorrhaphy     X 2    Family History  Problem Relation Age of Onset   Cholelithiasis Mother    Other Mother        Urinary incontince/ bladder prolapse/ h/o smoking   Cancer Mother        laryngeal carcinoma   Allergies Mother    Arthritis Mother        s/p knee and shoulder replacement   Dementia Mother        lewy body   Other Father        Renal failure  Hypertension Father    Coronary artery disease Father        s/p bypass   Aortic aneurysm Father    Hyperlipidemia Father    Heart disease Father 78       quadruple bypass   Spina bifida Brother        with stunt/ self cath   Seizures Brother        disorders   Colon cancer Maternal Grandmother    Cancer Maternal Grandmother        colon/ smoker   Cancer Maternal Grandfather        lung   Cancer Paternal Grandmother        Breast   Heart disease Paternal Grandfather    Stroke Paternal Grandfather    ADD / ADHD Daughter    ADD / ADHD Daughter    Depression Daughter        major depressive, anxiety, secondary psychosis   Rectal cancer Neg Hx    Stomach cancer Neg Hx    Esophageal cancer Neg Hx     Allergies  Allergen Reactions   Itraconazole     REACTION: Rash   Levaquin [Levofloxacin In D5w]     Joint pain    Current Medications:   Current Outpatient  Medications:    doxycycline (VIBRA-TABS) 100 MG tablet, Take 1 tablet (100 mg total) by mouth 2 (two) times daily., Disp: 28 tablet, Rfl: 0   Multiple Vitamin (MULTIVITAMIN WITH MINERALS) TABS tablet, Take 1 tablet by mouth daily., Disp: 100 tablet, Rfl: 0   pantoprazole (PROTONIX) 40 MG tablet, TAKE 1 TABLET (40 MG TOTAL) BY MOUTH IN THE MORNING. TAKE BEFORE BREAKFAST., Disp: 90 tablet, Rfl: 1   sertraline (ZOLOFT) 100 MG tablet, Take 2 tablets (200 mg total) by mouth daily., Disp: 180 tablet, Rfl: 0   Review of Systems:   Review of Systems  Constitutional:  Positive for malaise/fatigue.  HENT:  Positive for congestion and sinus pain. Negative for ear pain.   Respiratory:  Positive for cough and sputum production.   Negative unless otherwise specified per HPI.  Vitals:   Vitals:   04/04/23 0959  BP: 128/80  Pulse: 81  Temp: 99 F (37.2 C)  TempSrc: Temporal  SpO2: 98%  Weight: 132 lb (59.9 kg)  Height: 5\' 2"  (1.575 m)     Body mass index is 24.14 kg/m.  Physical Exam:   Physical Exam Vitals and nursing note reviewed.  Constitutional:      General: She is not in acute distress.    Appearance: She is well-developed. She is not ill-appearing or toxic-appearing.  HENT:     Head: Normocephalic and atraumatic.     Right Ear: Ear canal and external ear normal. A middle ear effusion is present. Tympanic membrane is not erythematous, retracted or bulging.     Left Ear: Ear canal and external ear normal. A middle ear effusion is present. Tympanic membrane is not erythematous, retracted or bulging.     Nose:     Right Sinus: Frontal sinus tenderness present. No maxillary sinus tenderness.     Left Sinus: Frontal sinus tenderness present. No maxillary sinus tenderness.     Mouth/Throat:     Pharynx: Uvula midline. No posterior oropharyngeal erythema.  Eyes:     General: Lids are normal.     Conjunctiva/sclera: Conjunctivae normal.  Neck:     Trachea: Trachea normal.   Cardiovascular:     Rate and Rhythm: Normal rate and regular rhythm.  Heart sounds: Normal heart sounds, S1 normal and S2 normal.  Pulmonary:     Effort: Pulmonary effort is normal.     Breath sounds: Normal breath sounds. No decreased breath sounds, wheezing, rhonchi or rales.  Lymphadenopathy:     Cervical: No cervical adenopathy.  Skin:    General: Skin is warm and dry.  Neurological:     Mental Status: She is alert.  Psychiatric:        Speech: Speech normal.        Behavior: Behavior normal. Behavior is cooperative.     Assessment and Plan:   Acute recurrent frontal sinusitis No red flags on exam.   Will initiate doxycycline per orders.  Discussed taking medications as prescribed.  Reviewed return precautions including new or worsening fever, SOB, new or worsening cough or other concerns.  Push fluids and rest.  I recommend that patient follow-up if symptoms worsen or persist despite treatment x 7-10 days, sooner if needed.     Jarold Motto, PA-C  I,Safa M Kadhim,acting as a scribe for Jarold Motto, PA.,have documented all relevant documentation on the behalf of Jarold Motto, PA,as directed by  Jarold Motto, PA while in the presence of Jarold Motto, [place].   I, Jarold Motto, [place], have reviewed all documentation for this visit. The documentation on 04/04/23 for the exam, diagnosis, procedures, and orders are all accurate and complete.

## 2023-04-04 NOTE — Telephone Encounter (Signed)
 Chief Complaint: sinus congestion Symptoms: sinus pressure/pain, H/A, nasal drainage/congestion, cough, chest "burning" Frequency: worsening past few days Pertinent Negatives: Patient denies fever, CP, SOB Disposition: [] ED /[] Urgent Care (no appt availability in office) / [x] Appointment(In office/virtual)/ []  [facility]/ [] Home Care/ [] Refused Recommended Disposition /[] [facility]/ []  Follow-up with PCP Additional Notes: Pt c/o sinus pain congestion, nasal drainage and cough that has worsened over the past few days. Pt also endorses H/A and has taken OTC medications with minimal relief. Denies CP, SOB, fevers. Pt is concerned with possible PNA/sinus infection as she has started to feel a "burning" in her chest. Pt reports she has had similar sx in the past that required prolonged course of doxycycline. Scheduled patient per protocol on 04/04/2023 at alternate [facility]. Patient verbalized understanding and to call back/911 with worsening symptoms.    Copied from CRM [id number]. Topic: Clinical - Red Word Triage >> Apr 04, 2023  9:21 AM Kathryne Eriksson wrote: Red Word that prompted transfer to Nurse Triage: Lingering Cough >> Apr 04, 2023  9:24 AM Kathryne Eriksson wrote: Patient states she has this lingering cough that has some yellowish colored mucus. Patient states she also has this burning / stinging sensation in her chest whenever she coughs. Patient also has concerns of possible sinus infection.  Reason for Disposition  [1] SEVERE pain AND [2] not improved 2 hours after pain medicine  Answer Assessment - Initial Assessment Questions 1. LOCATION: "Where does it hurt?"      Under eyes, eye sockets are tender, H/A 2. ONSET: "When did the sinus pain start?"  (e.g., hours, days)      Several days 3. SEVERITY: "How bad is the pain?"   (Scale 1-10; mild, moderate or severe)   - MILD (1-3): doesn't interfere with normal activities    - MODERATE (4-7): interferes with normal activities  (e.g., work or school) or awakens from sleep   - SEVERE (8-10): excruciating pain and patient unable to do any normal activities        5-6/10 4. RECURRENT SYMPTOM: "Have you ever had sinus problems before?" If Yes, ask: "When was the last time?" and "What happened that time?"      abx 5. NASAL CONGESTION: "Is the nose blocked?" If Yes, ask: "Can you open it or must you breathe through your mouth?"     Mouth breathing 6. NASAL DISCHARGE: "Do you have discharge from your nose?" If so ask, "What color?"     Clear to thick discolored  Answer Assessment - Initial Assessment Questions 1. ONSET: "When did the cough begin?"      A few weeks ago 2. SEVERITY: "How bad is the cough today?"      bad 3. SPUTUM: "Describe the color of your sputum" (none, dry cough; clear, white, yellow, green)     yellow 4. HEMOPTYSIS: "Are you coughing up any blood?" If so ask: "How much?" (flecks, streaks, tablespoons, etc.)     denies 5. DIFFICULTY BREATHING: "Are you having difficulty breathing?" If Yes, ask: "How bad is it?" (e.g., mild, moderate, severe)    - MILD: No SOB at rest, mild SOB with walking, speaks normally in sentences, can lie down, no retractions, pulse < 100.    - MODERATE: SOB at rest, SOB with minimal exertion and prefers to sit, cannot lie down flat, speaks in phrases, mild retractions, audible wheezing, pulse 100-120.    - SEVERE: Very SOB at rest, speaks in single words, struggling to breathe, sitting  hunched forward, retractions, pulse > 120      denies 6. FEVER: "Do you have a fever?" If Yes, ask: "What is your temperature, how was it measured, and when did it start?"     denies 7. CARDIAC HISTORY: "Do you have any history of heart disease?" (e.g., heart attack, congestive heart failure)      denies 8. LUNG HISTORY: "Do you have any history of lung disease?"  (e.g., pulmonary embolus, asthma, emphysema)     Hx of PNA, bronchitis 9. PE RISK FACTORS: "Do you have a history of blood clots?"  (or: recent major surgery, recent prolonged travel, bedridden)     denies 10. OTHER SYMPTOMS: "Do you have any other symptoms?" (e.g., runny nose, wheezing, chest pain)       Runny nose, mouth breathing  Protocols used: Cough - Acute Productive-A-AH, Sinus Pain or Congestion-A-AH

## 2023-04-05 ENCOUNTER — Ambulatory Visit: Payer: Self-pay

## 2023-04-05 NOTE — Telephone Encounter (Signed)
  Chief Complaint: 'wheeze or rattle' Symptoms: noisy breathing Frequency: intermittent Pertinent Negatives: Patient denies difficulty breathing Disposition: [] ED /[] Urgent Care (no appt availability in office) / [] Appointment(In office/virtual)/ []  [place] [facility]/ [x] Home Care/ [] Refused Recommended Disposition /[] [place] [facility]/ []  Follow-up with PCP Additional Notes:  04/04/23 acute evaluation for cough and chest burning, diagnosed with frontal sinusitis and started on Doxycycline, no imaging performed.  Calling now to see if she needs to do anything different or ok to continue doxycycine because she noted expiratory wheezing or rattle that started after the visit, denies any other new symptoms. Denies shortness of breath or difficulty breathing. Chest burning remains the same. Reports congestion seems to be improving. Last night noted wheezing, feels rattle in her chest. Started doxycycline yesterday. Patient plans to monitor symptoms and will call back for any worsening symptoms or breathing difficulties. Educated on care advice as documented in protocol, patient verbalized understanding. Discussed reasons to call back or call for EMS.  Patient aware message being sent to office and may receive call back with further recommendations.    Copied from CRM [id number]. Topic: Clinical - Red Word Triage >> Apr 05, 2023 12:44 PM [place] E wrote: [facility] that prompted transfer to Nurse Triage: Worsening cough. Patient was seen yesterday 4/3, but now patient is wheezing while coughing and breathing. Wheezing started after her appointment yesterday. Reason for Disposition  Cough  ALSO, mild central chest pain occurs only when coughing  Protocols used: Cough - Acute Productive-A-AH

## 2023-04-08 ENCOUNTER — Telehealth (INDEPENDENT_AMBULATORY_CARE_PROVIDER_SITE_OTHER): Admitting: Family Medicine

## 2023-04-08 DIAGNOSIS — R051 Acute cough: Secondary | ICD-10-CM | POA: Diagnosis not present

## 2023-04-08 MED ORDER — HYDROCODONE BIT-HOMATROP MBR 5-1.5 MG/5ML PO SOLN: 5.0000 mL | Freq: Three times a day (TID) | ORAL | 0 refills | Status: AC | PRN

## 2023-04-08 MED ORDER — ALBUTEROL SULFATE HFA 108 (90 BASE) MCG/ACT IN AERS: 2.0000 | INHALATION_SPRAY | Freq: Four times a day (QID) | RESPIRATORY_TRACT | 1 refills | Status: DC | PRN

## 2023-04-08 NOTE — Progress Notes (Signed)
 [facility] [address] [place], [place] [address] [phone]  Date:  04/08/2023   Name:  Jennifer Sandoval   DOB:  March 17, 1955   MRN:  [id number]  PCP:  Bradd Canary, MD    Chief Complaint: No chief complaint on file.   History of Present Illness:  [name] is a 68 y.o. very pleasant female patient who presents with the following:  Patient seen today with cough, primary patient of my partner Dr Mathews Robinsons visit today.  Patient location is home, location is office.  Patient identity confirmed with 2 factors, she gives consent for virtual visit today.  The patient and myself are present on the call today I saw her once previously 11/22 for a virtual visit.  History of gestational diabetes, gastric bypass, sinusitis, insomnia, allergies  Today pt notes a recent sinus infection which seemed to start with allergies She was seen last week at another office and was started on doxycycline See note date 4/3: Acute recurrent frontal sinusitis No red flags on exam.   Will initiate doxycycline per orders.  Discussed taking medications as prescribed.  Reviewed return precautions including new or worsening fever, SOB, new or worsening cough or other concerns.  Push fluids and rest.  I recommend that patient follow-up if symptoms worsen or persist despite treatment x 7-10 days, sooner if needed.  She now notes intermittent wheezing in her chest  Her sinuses are better but she still notes a lot of chest congestion and constant cough, occas productive She notes the cough is keeping her awake at night which is very bothersome No fever noted  She denies any new body aches, no chills No GI symptoms except for a little nausea she thinks due to the doxy  She has no history of asthma She had her pneumonia vaccine  Lab Results  Component Value Date   HGBA1C 6.0 10/10/2011     Patient Active Problem List   Diagnosis Date Noted    Anastomotic ulcer 09/06/2022   LUQ pain 03/20/2022   Pansinusitis 10/14/2020   History of gestational diabetes 01/30/2016   H/O gastric bypass 01/30/2016   Dry eyes, bilateral 01/30/2016   Acute bacterial sinusitis 09/29/2014   High serum phosphorus for age 28/27/2015   Palpitations 06/14/2013   Preventative health care 08/31/2012   Insomnia 01/23/2012   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   Epigastric pain 04/19/2010   Post-menopause 03/16/2010   BASAL CELL CARCINOMA SKIN LOWER LIMB INCL HIP 02/15/2010   LEIOMYOMA OF UTERUS, UNSPECIFIED 02/15/2010   FRACTURE, NOSE 10/22/2007   Neck and shoulder pain 10/10/2007   Anemia 10/24/2006   Depression with anxiety 10/24/2006   OSTEOARTHRITIS 10/24/2006   Allergic rhinitis 08/15/2006    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   Dry eyes, bilateral 01/30/2016   Dyspepsia 04/19/2010   FRACTURE, NOSE 10/22/2007   GERD (gastroesophageal reflux disease)    History of gestational  diabetes 01/30/2016   Insomnia 01/23/2012   Leiomyoma of uterus, unspecified 02/15/2010   Lumbar compression fracture (HCC)    nonsurgical   OSTEOARTHRITIS 10/24/2006   Osteoporosis    PERIMENOPAUSAL STATUS 03/16/2010   Post-menopause 03/16/2010   Qualifier: Diagnosis of  By: Abner Greenspan MD, [facility]     Preventative health care 08/31/2012   SHOULDER PAIN, BILATERAL 10/10/2007    Past Surgical History:  Procedure Laterality Date   ABDOMINAL HYSTERECTOMY  01/02/2007   partial, ovaries left in place, for  painful, heavy menstrrual bleeding secondary to anemia and fibroids   APPENDECTOMY     CESAREAN SECTION     X 3   CHOLECYSTECTOMY     COLONOSCOPY     GASTRIC BYPASS     HERNIA REPAIR     KNEE SURGERY Right    2018 or 2019   NASAL SINUS SURGERY     SEPTOPLASTY     skin biopsy     L hip BCC, facial lesions were benign   TUBAL LIGATION     ventral herniorrhaphy     X 2    Social History   Tobacco Use   Smoking status: Never   Smokeless tobacco: Never  Vaping Use   Vaping status: Never Used  Substance Use Topics   Alcohol use: Yes    Comment: special occasion   Drug use: No    Family History  Problem Relation Age of Onset   Cholelithiasis Mother    Other Mother        Urinary incontince/ bladder prolapse/ h/o smoking   Cancer Mother        laryngeal carcinoma   Allergies Mother    Arthritis Mother        s/p knee and shoulder replacement   Dementia Mother        lewy body   Other Father        Renal failure   Hypertension Father    Coronary artery disease Father        s/p bypass   Aortic aneurysm Father    Hyperlipidemia Father    Heart disease Father 41       quadruple bypass   Spina bifida Brother        with stunt/ self cath   Seizures Brother        disorders   Colon cancer Maternal Grandmother    Cancer Maternal Grandmother        colon/ smoker   Cancer Maternal Grandfather        lung   Cancer Paternal Grandmother        Breast   Heart disease Paternal Grandfather    Stroke Paternal Grandfather    ADD / ADHD Daughter    ADD / ADHD Daughter    Depression Daughter        major depressive, anxiety, secondary psychosis   Rectal cancer Neg Hx    Stomach cancer Neg Hx    Esophageal cancer Neg Hx     Allergies  Allergen Reactions   Itraconazole     REACTION: Rash   Levaquin [Levofloxacin In D5w]     Joint pain    Medication list has been reviewed and updated.  Current Outpatient Medications on File Prior to Visit  Medication Sig  Dispense Refill   doxycycline (VIBRA-TABS) 100 MG tablet Take 1 tablet (100 mg total) by mouth 2 (two) times daily. 28 tablet 0   Multiple Vitamin (MULTIVITAMIN WITH MINERALS) TABS tablet Take 1  tablet by mouth daily. 100 tablet 0   pantoprazole (PROTONIX) 40 MG tablet TAKE 1 TABLET (40 MG TOTAL) BY MOUTH IN THE MORNING. TAKE BEFORE BREAKFAST. 90 tablet 1   sertraline (ZOLOFT) 100 MG tablet Take 2 tablets (200 mg total) by mouth daily. 180 tablet 0   No current facility-administered medications on file prior to visit.    Review of Systems:  As per HPI- otherwise negative.   Physical Examination: There were no vitals filed for this visit. There were no vitals filed for this visit. There is no height or weight on file to calculate BMI. Ideal Body Weight:    Pt observed via mychart video-she looks well, no shortness of breath or distress is noted.  She has not been checking any vital signs at home  BP Readings from Last 3 Encounters:  04/04/23 128/80  10/22/22 119/75  09/06/22 106/60     Assessment and Plan: Acute cough - Plan: albuterol (VENTOLIN HFA) 108 (90 Base) MCG/ACT inhaler, HYDROcodone bit-homatropine (HYCODAN) 5-1.5 MG/5ML syrup  Patient seen today for a virtual visit with concern of cough.  As above she was recently started on doxycycline for sinusitis by another clinic.  Patient notes that her sinuses are getting better but she continues to have cough and also some wheezing.  The cough is keeping her awake at night.  I sent her in some hydrocodone cough syrup, caution this will cause sedation which she understands.  Also prescribed albuterol to use as needed for wheezing.  Offered to set up a chest x-ray but she declines for now.  She will let me know if not getting better in the next few days-will seek care sooner if getting worse  Signed Abbe Amsterdam, MD

## 2023-04-11 ENCOUNTER — Ambulatory Visit: Payer: Self-pay

## 2023-04-11 NOTE — Telephone Encounter (Signed)
  Chief Complaint: cough congestion cannot get up//wanting CXR ( per Dr Jenness Corner note pt was to call back if not improving to get xray. See triage notes Symptoms: coughing episodes, wheezing, ild SOB Frequency: > 1 week Pertinent Negatives: Patient denies fever Disposition: [] ED /[] Urgent Care (no appt availability in office) / [] Appointment(In office/virtual)/ []  [place] [facility]/ [] Home Care/ [] Refused Recommended Disposition /[] [place] [facility]/ [x]  Follow-up with PCP Additional Notes: Pt stated she has been calling since Tuesday to get xray but no one has called her back.  Message from Sherwood C sent at 04/11/2023  8:42 AM EDT  Summary: wheezing after seeing doctor   Copied From CRM [id number]. Reason for Triage: patient had appointment on Monday and is now wheezing when breathing. She has called to get clinic to call her back but has not received success with that. She would like a chest xray per what doctor said if breathing got worse or something to alleviate wheezing.            Reason for Disposition  Wheezing is present  Answer Assessment - Initial Assessment Questions 1. ONSET: "When did the cough begin?"      Over 1 week  2. SEVERITY: "How bad is the cough today?"      Wheezing that clears with inhaler, cannot get phlegm up 3. SPUTUM: "Describe the color of your sputum" (none, dry cough; clear, white, yellow, green)     No phlegm  4. HEMOPTYSIS: "Are you coughing up any blood?" If so ask: "How much?" (flecks, streaks, tablespoons, etc.)     no 5. DIFFICULTY BREATHING: "Are you having difficulty breathing?" If Yes, ask: "How bad is it?" (e.g., mild, moderate, severe)    - MILD: No SOB at rest, mild SOB with walking, speaks normally in sentences, can lie down, no retractions, pulse < 100.    - MODERATE: SOB at rest, SOB with minimal exertion and prefers to sit, cannot lie down flat, speaks in phrases, mild retractions, audible wheezing, pulse 100-120.    - SEVERE:  Very SOB at rest, speaks in single words, struggling to breathe, sitting hunched forward, retractions, pulse > 120      Mild  6. FEVER: "Do you have a fever?" If Yes, ask: "What is your temperature, how was it measured, and when did it start?"     No  10. OTHER SYMPTOMS: "Do you have any other symptoms?" (e.g., runny nose, wheezing, chest pain)       Sinus infection, cough, wheezing  Protocols used: Cough - Acute Non-Productive-A-AH

## 2023-04-11 NOTE — Telephone Encounter (Signed)
 Called her back - pt notes she is wheezing and coughing She is not in distress but just not getting better I scheduled her to see Ladona Ridgel tomorrow am at 7

## 2023-04-12 ENCOUNTER — Encounter: Payer: Self-pay | Admitting: Family Medicine

## 2023-04-12 ENCOUNTER — Ambulatory Visit (INDEPENDENT_AMBULATORY_CARE_PROVIDER_SITE_OTHER): Admitting: Family Medicine

## 2023-04-12 ENCOUNTER — Ambulatory Visit (HOSPITAL_BASED_OUTPATIENT_CLINIC_OR_DEPARTMENT_OTHER)
Admission: RE | Admit: 2023-04-12 | Discharge: 2023-04-12 | Disposition: A | Discharge status: Home / Self Care | Source: Ambulatory Visit | Attending: Family Medicine | Admitting: Family Medicine

## 2023-04-12 VITALS — BP 124/89 | HR 88 | Temp 98.1°F | Ht 62.0 in | Wt 129.0 lb

## 2023-04-12 DIAGNOSIS — R051 Acute cough: Secondary | ICD-10-CM | POA: Diagnosis not present

## 2023-04-12 DIAGNOSIS — R062 Wheezing: Secondary | ICD-10-CM

## 2023-04-12 DIAGNOSIS — R059 Cough, unspecified: Secondary | ICD-10-CM | POA: Diagnosis not present

## 2023-04-12 MED ORDER — PREDNISONE 20 MG PO TABS: 40.0000 mg | ORAL_TABLET | Freq: Every day | ORAL | 0 refills | Status: AC

## 2023-04-12 NOTE — Progress Notes (Signed)
 Acute Office Visit  Subjective:     Patient ID: Jennifer Sandoval, female    DOB: 1955/06/07, 68 y.o.   MRN: [id number]  Chief Complaint  Patient presents with   Cough     Patient is in today for cough, wheezing.   Discussed the use of AI scribe software for clinical note transcription with the patient, who gave verbal consent to proceed.  History of Present Illness Jennifer Sandoval is a 68 year old female who presents with persistent cough and wheezing.  She initially experienced allergy symptoms that progressed to a sinus infection, for which she was prescribed doxycycline at the beginning of April. At the onset, she had a mild cough without wheezing.  She has since developed wheezing and a sensation of 'rattling' in her chest, which was new for her. She was prescribed an inhaler and cough medicine due to persistent coughing, especially at night. Despite these treatments, her condition worsened. Her symptoms 'wax and wane,' with wheezing persisting at the end of her breath. No hemoptysis, but she experiences painful coughing fits with difficulty expectorating mucus.  She uses Mucinex to help break up mucus and reports improved sleep with the cough syrup. However, she finds it difficult to assess the inhaler's effectiveness, using it every eight hours without significant improvement. She is allergic to Levaquin, which causes joint pain, and has not taken any other recent antibiotics besides doxycycline. She has taken prednisone before without notable side effects.  She has no history of asthma or COPD but has had pneumonia in the past, including a severe episode in January 2024, where she was hospitalized with pneumonia, flu, COVID-19, strep throat, and sepsis. She has experienced slight fevers on and off, with temperatures around 17F, and occasional chills.  She reports decreased energy levels and difficulty performing housework, noting 'I don't have none' when asked about her usual energy  levels.        All review of systems negative except what is listed in the HPI      Objective:    BP 124/89   Pulse 88   Temp 98.1 F (36.7 C) (Oral)   Ht 5\' 2"  (1.575 m)   Wt 129 lb (58.5 kg)   SpO2 98%   BMI 23.59 kg/m    Physical Exam Vitals reviewed.  Constitutional:      Appearance: Normal appearance.  Cardiovascular:     Rate and Rhythm: Normal rate and regular rhythm.  Pulmonary:     Effort: Pulmonary effort is normal. No respiratory distress.     Breath sounds: No stridor. Wheezing and rhonchi present.  Skin:    General: Skin is warm and dry.  Neurological:     Mental Status: She is alert and oriented to person, place, and time.  Psychiatric:        Mood and Affect: Mood normal.        Behavior: Behavior normal.        Thought Content: Thought content normal.        Judgment: Judgment normal.         No results found for any visits on 04/12/23.      Assessment & Plan:   Problem List Items Addressed This Visit   None Visit Diagnoses       Acute cough    -  Primary   Relevant Medications   predniSONE (DELTASONE) 20 MG tablet   Other Relevant Orders   DG Chest 2 View (Completed)     Wheezing  Relevant Medications   predniSONE (DELTASONE) 20 MG tablet   Other Relevant Orders   DG Chest 2 View (Completed)      Assessment & Plan  New bilateral wheezing and persistent cough post-sinus infection. Differential includes asthma, bronchitis, or pneumonia. Prednisone expected to improve airflow. - Order stat chest x-ray. Add treatment pending results. - Prescribe prednisone 5-day burst. - Continue inhaler and cough syrup as needed. - Continue Mucinex for mucus relief.  Patient aware of signs/symptoms requiring further/urgent evaluation.     Meds ordered this encounter  Medications   predniSONE (DELTASONE) 20 MG tablet    Sig: Take 2 tablets (40 mg total) by mouth daily with breakfast for 5 days.    Dispense:  10 tablet     Refill:  0    Supervising Provider:   Danise Edge A [[id number]]    Return if symptoms worsen or fail to improve.  Clayborne Dana, NP

## 2023-04-28 ENCOUNTER — Other Ambulatory Visit: Payer: Self-pay | Admitting: Family Medicine

## 2023-04-30 ENCOUNTER — Ambulatory Visit: Admitting: Medical

## 2023-04-30 ENCOUNTER — Telehealth (INDEPENDENT_AMBULATORY_CARE_PROVIDER_SITE_OTHER): Admitting: Medical

## 2023-04-30 DIAGNOSIS — L089 Local infection of the skin and subcutaneous tissue, unspecified: Secondary | ICD-10-CM | POA: Diagnosis not present

## 2023-04-30 DIAGNOSIS — H1032 Unspecified acute conjunctivitis, left eye: Secondary | ICD-10-CM

## 2023-04-30 MED ORDER — DOXYCYCLINE HYCLATE 100 MG PO TABS
100.0000 mg | ORAL_TABLET | Freq: Two times a day (BID) | ORAL | 0 refills | Status: DC
Start: 2023-04-30 — End: 2023-07-19

## 2023-04-30 MED ORDER — TOBRAMYCIN 0.3 % OP SOLN: 2.0000 [drp] | Freq: Four times a day (QID) | OPHTHALMIC | 0 refills | Status: DC

## 2023-04-30 NOTE — Progress Notes (Signed)
 Virtual Visit via Video Note  I connected with Jennifer Sandoval on 04/30/23 at 10:40 AM EDT by a video enabled telemedicine application and verified that I am speaking with the correct person using two identifiers.  Location: Patient: home  Provider: office   I discussed the limitations of evaluation and management by telemedicine and the availability of in person appointments. The patient expressed understanding and agreed to proceed.  History of Present Illness: Discussed the use of AI scribe software for clinical note transcription with the patient, who gave verbal consent to proceed.  History of Present Illness         Pt in with left upper eye lid stye/small lump upper eye lid lateral portion of lid. But also the entire lid is mild red and tender to self palpation. Pt states some matting/yellow dc. Pt has triplets 67 yo  that live with her and other 67 yo that live with her. None have conjunctivits.  No visual changes reported and no report of any difficulty moving her left eye.    Observations/Objective: General-no acute distress, pleasant, oriented. Lungs- on inspection lungs appear unlabored. Neck- no tracheal deviation or jvd on inspection. Neuro- gross motor function appears intact. Face- on inspection by video has apparent stye in the left upper eye lash region..  Lateral aspect.  But also has redness and swelling of the upper eyelid.  When she palpates upper eyelid she reports mild pain.  Assessment and Plan: Assessment and Plan Patient Instructions  Left eye conjunctivitis with described matting.  By video visit and appearance of the left upper eyelid do have a concern for eyelid infection/early cellulitis.  So decided to prescribe Tobrex  eyedrops to use 2 drops every 6-8 hours.  Also went ahead and prescribed doxycycline  100 mg 14 tablets to use 1 tablet twice daily for 7 days.  Asking please update me in 7 days regarding out stye area and upper lid is doing.  Expect progressive  improvement but if area worsens or changes please notify me before then.   Jennifer Everts, PA-C     Follow Up Instructions:    I discussed the assessment and treatment plan with the patient. The patient was provided an opportunity to ask questions and all were answered. The patient agreed with the plan and demonstrated an understanding of the instructions.   The patient was advised to call back or seek an in-person evaluation if the symptoms worsen or if the condition fails to improve as anticipated.   Jennifer Widmayer, PA-C

## 2023-04-30 NOTE — Patient Instructions (Signed)
 Left eye conjunctivitis with described matting.  By video visit and appearance of the left upper eyelid do have a concern for eyelid infection/early cellulitis.  So decided to prescribe Tobrex  eyedrops to use 2 drops every 6-8 hours.  Also went ahead and prescribed doxycycline  100 mg 14 tablets to use 1 tablet twice daily for 7 days.  Asking please update me in 7 days regarding out stye area and upper lid is doing.  Expect progressive improvement but if area worsens or changes please notify me before then.

## 2023-05-21 ENCOUNTER — Ambulatory Visit (INDEPENDENT_AMBULATORY_CARE_PROVIDER_SITE_OTHER)

## 2023-05-21 VITALS — Ht 62.0 in | Wt 129.0 lb

## 2023-05-21 DIAGNOSIS — Z0000 Encounter for general adult medical examination without abnormal findings: Secondary | ICD-10-CM | POA: Diagnosis not present

## 2023-05-21 NOTE — Patient Instructions (Signed)
 Jennifer Sandoval , Thank you for taking time out of your busy schedule to complete your Annual Wellness Visit with me. I enjoyed our conversation and look forward to speaking with you again next year. I, as well as your care team,  appreciate your ongoing commitment to your health goals. Please review the following plan we discussed and let me know if I can assist you in the future. Your Game plan/ To Do List    Follow up Visits: Next Medicare AWV with our clinical staff: In 1 year    Have you seen your provider in the last 6 months (3 months if uncontrolled diabetes)? Yes Next Office Visit with your provider: To be scheduled  Clinician Recommendations:  Aim for 30 minutes of exercise or brisk walking, 6-8 glasses of water, and 5 servings of fruits and vegetables each day.       This is a list of the screening recommended for you and due dates:  Health Maintenance  Topic Date Due   COVID-19 Vaccine (1) Never done   Hepatitis C Screening  Never done   Zoster (Shingles) Vaccine (1 of 2) 10/02/1975   Flu Shot  08/02/2023   Mammogram  04/16/2024   Medicare Annual Wellness Visit  05/20/2024   DTaP/Tdap/Td vaccine (4 - Td or Tdap) 09/25/2027   Colon Cancer Screening  05/29/2032   Pneumonia Vaccine  Completed   DEXA scan (bone density measurement)  Completed   HPV Vaccine  Aged Out   Meningitis B Vaccine  Aged Out    Advanced directives: (ACP Link)Information on Advanced Care Planning can be found at [place]  Best boy Advance Health Care Directives Advance Health Care Directives. [email or web address]   Advance Care Planning is important because it:  [x]  Makes sure you receive the medical care that is consistent with your values, goals, and preferences  [x]  It provides guidance to your family and loved ones and reduces their decisional burden about whether or not they are making the right decisions based on your wishes.  Follow the link provided in your after visit summary or read over  the paperwork we have mailed to you to help you started getting your Advance Directives in place. If you need assistance in completing these, please reach out to us  so that we can help you!  See attachments for Preventive Care and Fall Prevention Tips.

## 2023-05-21 NOTE — Progress Notes (Signed)
 Subjective:   Jennifer Sandoval is a 68 y.o. who presents for a Medicare Wellness preventive visit.  As a reminder, Annual Wellness Visits don't include a physical exam, and some assessments may be limited, especially if this visit is performed virtually. We may recommend an in-person follow-up visit with your provider if needed.  Visit Complete: Virtual I connected with  Jennifer Sandoval on 05/21/23 by a audio enabled telemedicine application and verified that I am speaking with the correct person using two identifiers.  Patient Location: Home  Provider Location: Home Office  I discussed the limitations of evaluation and management by telemedicine. The patient expressed understanding and agreed to proceed.  Vital Signs: Because this visit was a virtual/telehealth visit, some criteria may be missing or patient reported. Any vitals not documented were not able to be obtained and vitals that have been documented are patient reported.  VideoDeclined- This patient declined Librarian, academic. Therefore the visit was completed with audio only.  Persons Participating in Visit: Patient.  AWV Questionnaire: No: Patient Medicare AWV questionnaire was not completed prior to this visit.  Cardiac Risk Factors include: advanced age (>35men, >29 women)     Objective:     Today's Vitals   05/21/23 1100  Weight: 129 lb (58.5 kg)  Height: 5\' 2"  (1.575 m)   Body mass index is 23.59 kg/m.     05/21/2023   11:08 AM 03/26/2022   10:22 AM 01/07/2022    8:40 PM 01/07/2022    9:53 AM 01/04/2022    1:32 AM 11/01/2016    4:18 PM  Advanced Directives  Does Patient Have a Medical Advance Directive? No Yes  No No No  Type of Advance Directive  Living will;Healthcare Power of Attorney      Copy of Healthcare Power of Attorney in Chart?  No - copy requested      Would patient like information on creating a medical advance directive? Yes (MAU/Ambulatory/Procedural Areas - Information  given)  No - Patient declined       Current Medications (verified) Outpatient Encounter Medications as of 05/21/2023  Medication Sig   doxycycline  (VIBRA -TABS) 100 MG tablet Take 1 tablet (100 mg total) by mouth 2 (two) times daily.   Multiple Vitamin (MULTIVITAMIN WITH MINERALS) TABS tablet Take 1 tablet by mouth daily.   pantoprazole  (PROTONIX ) 40 MG tablet TAKE 1 TABLET (40 MG TOTAL) BY MOUTH IN THE MORNING. TAKE BEFORE BREAKFAST.   sertraline  (ZOLOFT ) 100 MG tablet TAKE 2 TABLETS BY MOUTH EVERY DAY   tobramycin  (TOBREX ) 0.3 % ophthalmic solution Place 2 drops into the left eye every 6 (six) hours.   [DISCONTINUED] albuterol  (VENTOLIN  HFA) 108 (90 Base) MCG/ACT inhaler Inhale 2 puffs into the lungs every 6 (six) hours as needed for wheezing or shortness of breath.   [DISCONTINUED] doxycycline  (VIBRA -TABS) 100 MG tablet Take 1 tablet (100 mg total) by mouth 2 (two) times daily.   No facility-administered encounter medications on file as of 05/21/2023.    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   Dry eyes, bilateral 01/30/2016   Dyspepsia 04/19/2010   FRACTURE, NOSE 10/22/2007   GERD (gastroesophageal reflux disease)    History of gestational diabetes 01/30/2016   Insomnia 01/23/2012   Leiomyoma of uterus, unspecified 02/15/2010   Lumbar compression fracture (HCC)    nonsurgical   OSTEOARTHRITIS 10/24/2006   Osteoporosis    PERIMENOPAUSAL STATUS 03/16/2010    Post-menopause 03/16/2010   Qualifier: Diagnosis of  By: Rodrick Clapper MD, Stacey     Preventative health care 08/31/2012   SHOULDER PAIN, BILATERAL 10/10/2007   Past Surgical History:  Procedure Laterality Date   ABDOMINAL HYSTERECTOMY  01/02/2007   partial, ovaries left in place, for painful, heavy menstrrual bleeding secondary to anemia and fibroids   APPENDECTOMY     CESAREAN SECTION     X 3   CHOLECYSTECTOMY     COLONOSCOPY     GASTRIC BYPASS     HERNIA REPAIR     KNEE SURGERY Right    2018 or 2019   NASAL SINUS SURGERY     SEPTOPLASTY     skin biopsy     L hip BCC, facial lesions were benign   TUBAL LIGATION     ventral herniorrhaphy     X 2   Family History  Problem Relation Age of Onset   Cholelithiasis Mother    Other Mother        Urinary incontince/ bladder prolapse/ h/o smoking   Cancer Mother        laryngeal carcinoma   Allergies Mother    Arthritis Mother        s/p knee and shoulder replacement   Dementia Mother        lewy body   Other Father        Renal failure   Hypertension Father    Coronary artery disease Father        s/p bypass   Aortic aneurysm Father    Hyperlipidemia Father    Heart disease Father 72       quadruple bypass   Spina bifida Brother        with stunt/ self cath   Seizures Brother        disorders   Colon cancer Maternal Grandmother    Cancer Maternal Grandmother        colon/ smoker   Cancer Maternal Grandfather        lung   Cancer Paternal Grandmother        Breast   Heart disease Paternal Grandfather    Stroke Paternal Grandfather    ADD / ADHD Daughter    ADD / ADHD Daughter    Depression Daughter        major depressive, anxiety, secondary psychosis   Rectal cancer Neg Hx    Stomach cancer Neg Hx    Esophageal cancer Neg Hx    Social History   Socioeconomic History   Marital status: Married    Spouse name: Not on file   Number of children: Not on file   Years of education: Not on file   Highest  education level: Bachelor's degree (e.g., BA, AB, BS)  Occupational History   Not on file  Tobacco Use   Smoking status: Never   Smokeless tobacco: Never  Vaping Use   Vaping status: Never Used  Substance and Sexual Activity   Alcohol use:  Yes    Comment: special occasion   Drug use: No   Sexual activity: Yes    Partners: Male  Other Topics Concern   Not on file  Social History Narrative   Not on file   Social Drivers of Health   Financial Resource Strain: Low Risk  (05/21/2023)   Overall Financial Resource Strain (CARDIA)    Difficulty of Paying Living Expenses: Not very hard  Food Insecurity: No Food Insecurity (05/21/2023)   Hunger Vital Sign    Worried About Running Out of Food in the Last Year: Never true    Ran Out of Food in the Last Year: Never true  Transportation Needs: No Transportation Needs (05/21/2023)   PRAPARE - Administrator, Civil Service (Medical): No    Lack of Transportation (Non-Medical): No  Physical Activity: Insufficiently Active (05/21/2023)   Exercise Vital Sign    Days of Exercise per Week: 3 days    Minutes of Exercise per Session: 30 min  Stress: No Stress Concern Present (05/21/2023)   [employer] of Occupational Health - Occupational Stress Questionnaire    Feeling of Stress : Only a little  Social Connections: Socially Integrated (05/21/2023)   Social Connection and Isolation Panel [NHANES]    Frequency of Communication with Friends and Family: Three times a week    Frequency of Social Gatherings with Friends and Family: Once a week    Attends Religious Services: More than 4 times per year    Active Member of [employer] or Organizations: Yes    Attends Engineer, structural: More than 4 times per year    Marital Status: Married    Tobacco Counseling Counseling given: Not Answered    Clinical Intake:  Pre-visit preparation completed: Yes  Pain : No/denies pain     Diabetes: No  Lab Results  Component Value  Date   HGBA1C 6.0 10/10/2011     How often do you need to have someone help you when you read instructions, pamphlets, or other written materials from your doctor or pharmacy?: 1 - Never  Interpreter Needed?: No  Information entered by :: Seabron Cypress LPN   Activities of Daily Living     05/21/2023   11:08 AM  In your present state of health, do you have any difficulty performing the following activities:  Hearing? 0  Vision? 0  Difficulty concentrating or making decisions? 0  Walking or climbing stairs? 0  Dressing or bathing? 0  Doing errands, shopping? 0  Preparing Food and eating ? N  Using the Toilet? N  In the past six months, have you accidently leaked urine? N  Do you have problems with loss of bowel control? N  Managing your Medications? N  Managing your Finances? N  Housekeeping or managing your Housekeeping? N    Patient Care Team: Neda Balk, MD as PCP - General (Family Medicine)  Indicate any recent Medical Services you may have received from other than [facility] providers in the past year (date may be approximate).     Assessment:    This is a routine wellness examination for Treniya.  Hearing/Vision screen Hearing Screening - Comments:: Denies hearing difficulties   Vision Screening - Comments:: Wears rx glasses - up to date with routine eye exams with Dr. Radonna Buggy     Goals Addressed             This Visit's Progress    Remain active and independent   On track  Depression Screen     05/21/2023   11:07 AM 06/25/2022   10:39 AM 06/25/2022   10:35 AM 03/26/2022   10:30 AM 03/20/2022    9:32 AM  PHQ 2/9 Scores  PHQ - 2 Score 0 0 0 0 2  PHQ- 9 Score  0 0  2    Fall Risk     05/21/2023   11:08 AM 06/25/2022   10:39 AM 06/25/2022   10:35 AM 03/25/2022    9:15 PM 03/20/2022    9:32 AM  Fall Risk   Falls in the past year? 0 0 0 1   Comment    tripped over children's toy   Number falls in past yr: 0 0 0 1 1  Injury with Fall? 0 0 0 1 1   Risk for fall due to : No Fall Risks   History of fall(s)   Follow up Falls prevention discussed;Education provided;Falls evaluation completed Falls evaluation completed Falls evaluation completed Falls evaluation completed Falls evaluation completed    MEDICARE RISK AT HOME:  Medicare Risk at Home Any stairs in or around the home?: Yes If so, are there any without handrails?: No Home free of loose throw rugs in walkways, pet beds, electrical cords, etc?: Yes Adequate lighting in your home to reduce risk of falls?: Yes Life alert?: No Use of a cane, walker or w/c?: No Grab bars in the bathroom?: Yes Shower chair or bench in shower?: No Elevated toilet seat or a handicapped toilet?: Yes  TIMED UP AND GO:  Was the test performed?  No  Cognitive Function: 6CIT completed        05/21/2023   11:08 AM 03/26/2022   10:39 AM  6CIT Screen  What Year? 0 points 0 points  What month? 0 points 0 points  What time? 0 points 0 points  Count back from 20 0 points 0 points  Months in reverse 0 points 0 points  Repeat phrase 0 points 0 points  Total Score 0 points 0 points    Immunizations Immunization History  Administered Date(s) Administered   Fluad Quad(high Dose 65+) 01/23/2022   Influenza Whole 10/24/2006, 10/10/2007   Influenza,inj,Quad PF,6+ Mos 10/15/2012, 09/25/2013, 11/02/2014, 09/24/2017, 11/09/2018   PNEUMOCOCCAL CONJUGATE-20 01/23/2022   Td 10/05/1997, 01/30/2016   Tdap 09/24/2017   Zoster, Live 08/29/2012    Screening Tests Health Maintenance  Topic Date Due   COVID-19 Vaccine (1) Never done   Hepatitis C Screening  Never done   Zoster Vaccines- Shingrix (1 of 2) 10/02/1975   INFLUENZA VACCINE  08/02/2023   MAMMOGRAM  04/16/2024   Medicare Annual Wellness (AWV)  05/20/2024   DTaP/Tdap/Td (4 - Td or Tdap) 09/25/2027   Colonoscopy  05/29/2032   Pneumonia Vaccine 43+ Years old  Completed   DEXA SCAN  Completed   HPV VACCINES  Aged Out   Meningococcal B Vaccine   Aged Out    Health Maintenance  Health Maintenance Due  Topic Date Due   COVID-19 Vaccine (1) Never done   Hepatitis C Screening  Never done   Zoster Vaccines- Shingrix (1 of 2) 10/02/1975    Additional Screening:  Vision Screening: Recommended annual ophthalmology exams for early detection of glaucoma and other disorders of the eye.  Dental Screening: Recommended annual dental exams for proper oral hygiene  Community Resource Referral / Chronic Care Management: CRR required this visit?  No   CCM required this visit?  No   Plan:    I have  personally reviewed and noted the following in the patient's chart:   Medical and social history Use of alcohol, tobacco or illicit drugs  Current medications and supplements including opioid prescriptions. Patient is not currently taking opioid prescriptions. Functional ability and status Nutritional status Physical activity Advanced directives List of other physicians Hospitalizations, surgeries, and ER visits in previous 12 months Vitals Screenings to include cognitive, depression, and falls Referrals and appointments  In addition, I have reviewed and discussed with patient certain preventive protocols, quality metrics, and best practice recommendations. A written personalized care plan for preventive services as well as general preventive health recommendations were provided to patient.   Seabron Cypress [place], [place]   1/61/0960   After Visit Summary: (MyChart) Due to this being a telephonic visit, the after visit summary with patients personalized plan was offered to patient via MyChart   Notes: Nothing significant to report at this time.

## 2023-07-19 ENCOUNTER — Encounter: Payer: Self-pay | Admitting: Family Medicine

## 2023-07-19 ENCOUNTER — Telehealth: Admitting: Family Medicine

## 2023-07-19 VITALS — Ht 62.0 in | Wt 129.0 lb

## 2023-07-19 DIAGNOSIS — J0140 Acute pansinusitis, unspecified: Secondary | ICD-10-CM

## 2023-07-19 MED ORDER — DOXYCYCLINE HYCLATE 100 MG PO TABS: 100.0000 mg | ORAL_TABLET | Freq: Two times a day (BID) | ORAL | 0 refills | Status: AC

## 2023-07-19 NOTE — Progress Notes (Signed)
 Virtual Video Visit via MyChart Note  I connected with  [name] on 07/19/23 at 11:00 AM EDT by the video enabled telemedicine application for MyChart, and verified that I am speaking with the correct person using two identifiers.   I introduced myself as a Publishing rights manager with the practice. We discussed the limitations of evaluation and management by telemedicine and the availability of in person appointments. The patient expressed understanding and agreed to proceed.  Participating parties in this visit include: The patient and the nurse practitioner listed.  The patient is: At home I am: In the office - [facility]  Subjective:    CC:  Chief Complaint  Patient presents with   Sinus Problem    HPI: Jennifer Sandoval is a 68 y.o. year old female presenting today via MyChart today for sinusitis.  Patient reports 2 weeks of gradually worsening sinus symptoms. She is having nasal congestion, sinus pain 7/10 bilateral maxillary sinuses, now also with some frontal pressure/headache, postnasal drainage. She has been getting minimal relief with mucinex  and tylenol . Denies fevers or respiratory symptoms. States she usually needs 14 days of doxy to get rid of this. Last similar episode was November of last year.      Past medical history, Surgical history, Family history not pertinant except as noted below, Social history, Allergies, and medications have been entered into the medical record, reviewed, and corrections made.   Review of Systems:  All review of systems negative except what is listed in the HPI   Objective:    General:  Speaking clearly in complete sentences. Absent shortness of breath noted.   Alert and oriented x3.   Normal judgment.  Absent acute distress.   Impression and Recommendations:    Problem List Items Addressed This Visit       Active Problems   Pansinusitis - Primary   Relevant Medications   doxycycline  (VIBRA -TABS)  100 MG tablet    Given duration and severity of symptoms, start doxy BID. Continue supportive measures including rest, hydration, humidifier use, steam showers, warm compresses to sinuses, warm liquids with lemon and honey, and over-the-counter cough, cold, and analgesics as needed.  Patient aware of signs/symptoms requiring further/urgent evaluation.    Follow-up if symptoms worsen or fail to improve.    I discussed the assessment and treatment plan with the patient. The patient was provided an opportunity to ask questions and all were answered. The patient agreed with the plan and demonstrated an understanding of the instructions.   The patient was advised to call back or seek an in-person evaluation if the symptoms worsen or if the condition fails to improve as anticipated.   Waddell KATHEE Mon, NP

## 2023-07-19 NOTE — Progress Notes (Signed)
 Started over 2 weeks ago  Sinus congestion Headache Pressure Mucous   Taking mucinex , tylenol 

## 2023-07-30 ENCOUNTER — Other Ambulatory Visit: Payer: Self-pay | Admitting: Family Medicine

## 2023-07-30 ENCOUNTER — Telehealth: Payer: Self-pay | Admitting: Family Medicine

## 2023-07-30 MED ORDER — MECLIZINE HCL 25 MG PO TABS: 25.0000 mg | ORAL_TABLET | Freq: Three times a day (TID) | ORAL | 1 refills | Status: AC | PRN

## 2023-07-30 NOTE — Telephone Encounter (Unsigned)
 Copied from CRM [id number]. Topic: Clinical - Medication Refill >> Jul 30, 2023 11:47 AM Thersia C wrote: Medication: meclizine  (ANTIVERT ) 25 MG tablet  Has the patient contacted their pharmacy? Yes (Agent: If no, request that the patient contact the pharmacy for the refill. If patient does not wish to contact the pharmacy document the reason why and proceed with request.) (Agent: If yes, when and what did the pharmacy advise?)  This is the patient's preferred pharmacy:  [id number] - [place], [place] - [address] [place] [address] Phone: [phone] Fax: [phone]   Is this the correct pharmacy for this prescription? Yes If no, delete pharmacy and type the correct one.   Has the prescription been filled recently? No  Is the patient out of the medication? Yes  Has the patient been seen for an appointment in the last year OR does the patient have an upcoming appointment? Yes  Can we respond through MyChart? Yes  Agent: Please be advised that Rx refills may take up to 3 business days. We ask that you follow-up with your pharmacy.

## 2023-07-30 NOTE — Telephone Encounter (Signed)
 Medication was discontinued by Jason Leita Repine, FNP on 08/31/2022

## 2023-09-26 DIAGNOSIS — H35363 Drusen (degenerative) of macula, bilateral: Secondary | ICD-10-CM | POA: Diagnosis not present

## 2023-09-26 DIAGNOSIS — H52223 Regular astigmatism, bilateral: Secondary | ICD-10-CM | POA: Diagnosis not present

## 2023-09-26 DIAGNOSIS — H43812 Vitreous degeneration, left eye: Secondary | ICD-10-CM | POA: Diagnosis not present

## 2023-09-26 DIAGNOSIS — H2513 Age-related nuclear cataract, bilateral: Secondary | ICD-10-CM | POA: Diagnosis not present

## 2023-09-26 DIAGNOSIS — H5203 Hypermetropia, bilateral: Secondary | ICD-10-CM | POA: Diagnosis not present

## 2023-09-26 DIAGNOSIS — H524 Presbyopia: Secondary | ICD-10-CM | POA: Diagnosis not present

## 2023-10-18 ENCOUNTER — Telehealth: Admitting: Family Medicine

## 2023-10-18 ENCOUNTER — Encounter: Payer: Self-pay | Admitting: Family Medicine

## 2023-10-18 VITALS — Ht 62.0 in | Wt 128.0 lb

## 2023-10-18 DIAGNOSIS — J324 Chronic pansinusitis: Secondary | ICD-10-CM | POA: Diagnosis not present

## 2023-10-18 DIAGNOSIS — J329 Chronic sinusitis, unspecified: Secondary | ICD-10-CM

## 2023-10-18 MED ORDER — DOXYCYCLINE HYCLATE 100 MG PO TABS: 100.0000 mg | ORAL_TABLET | Freq: Two times a day (BID) | ORAL | 0 refills | Status: AC

## 2023-10-18 NOTE — Progress Notes (Signed)
   Jennifer Sandoval is a 68 y.o. female who presents today for a virtual office visit.  Assessment/Plan:  Recurrent sinusitis Patient with chronic and recurrent sinusitis for many years.  Most recent flareup started a few weeks ago.  We discussed limitations of virtual visit and inability to perform physical exam.  Patient states that she typically has resolution with 14 days of doxycycline .  Will send this in for her today.  It is okay for her to continue over-the-counter meds as needed.  Encouraged hydration.  We did discuss referral to ENT regarding the recurrent chronic nature of her sinus infections however patient is not interested in this at this time.  She can discuss further with her PCP at their next visit.  We discussed reasons to return to care.  She will follow-up as needed    Subjective:  HPI:  See Assessment / plan for status of chronic conditions.  Patient here today with recurrent sinusitis.  Symptoms started a few weeks ago with nasal congestion and seem to be worsening over the last several days.  Associated symptoms include headache and postnasal drip which is consistent with previous sinus infections.  She has seen ENT in the past but did not find it was effective for managing her sinus infections.  She has tried taking Mucinex  with some improvement.  She normally gets doxycycline  for sinus infections which seems to work well for her.  No fevers or chills.       Objective/Observations  Physical Exam: Gen: NAD, resting comfortably Pulm: Normal work of breathing Neuro: Grossly normal, moves all extremities Psych: Normal affect and thought content  Virtual Visit via Video   I connected with Jennifer Sandoval on 10/18/23 at 10:40 AM EDT by a video enabled telemedicine application and verified that I am speaking with the correct person using two identifiers. The limitations of evaluation and management by telemedicine and the availability of in person appointments were discussed. The  patient expressed understanding and agreed to proceed.   Patient location: Home Provider location: [place] [facility] [employer] Persons participating in the virtual visit: Myself and Patient     Worth HERO. Kennyth, MD 10/18/2023 11:03 AM

## 2023-10-25 ENCOUNTER — Other Ambulatory Visit: Payer: Self-pay | Admitting: Family Medicine

## 2024-01-03 ENCOUNTER — Telehealth: Admitting: Family Medicine

## 2024-01-03 ENCOUNTER — Ambulatory Visit: Admitting: Family Medicine

## 2024-01-03 ENCOUNTER — Encounter: Payer: Self-pay | Admitting: Family Medicine

## 2024-01-03 DIAGNOSIS — J0140 Acute pansinusitis, unspecified: Secondary | ICD-10-CM | POA: Diagnosis not present

## 2024-01-03 MED ORDER — DOXYCYCLINE HYCLATE 100 MG PO TABS: 100.0000 mg | ORAL_TABLET | Freq: Two times a day (BID) | ORAL | 0 refills | Status: DC

## 2024-01-03 NOTE — Progress Notes (Signed)
 Chief Complaint  Patient presents with   Sinusitis    Sinusitis     Riti Rollyson here for URI complaints. We are interacting via web portal for an electronic face-to-face visit. I verified patient's ID using 2 identifiers. Patient agreed to proceed with visit via this method. Patient is at home, I am at office. Patient and I are present for visit.   Duration: 2 weeks - not improving Associated symptoms: sinus congestion, sinus pain, sore throat, and slight cough Denies: rhinorrhea, itchy watery eyes, ear pain, ear drainage, wheezing, shortness of breath, myalgia, and fevers Treatment to date: Mucinex , Tylenol  Sick contacts: Yes- family members with URI s/s's  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   Dry eyes, bilateral 01/30/2016   Dyspepsia 04/19/2010   FRACTURE, NOSE 10/22/2007   GERD (gastroesophageal reflux disease)    History of gestational diabetes 01/30/2016   Insomnia 01/23/2012   Leiomyoma of uterus, unspecified 02/15/2010   Lumbar compression fracture (HCC)    nonsurgical   OSTEOARTHRITIS 10/24/2006   Osteoporosis    PERIMENOPAUSAL STATUS 03/16/2010   Post-menopause 03/16/2010   Qualifier: Diagnosis of  By: Domenica MD, Stacey     Preventative health care 08/31/2012   SHOULDER PAIN, BILATERAL 10/10/2007    Objective No conversational dyspnea Age appropriate judgment and insight Nml affect and mood  Acute pansinusitis, recurrence not specified - Plan: doxycycline  (VIBRA -TABS) 100 MG tablet  Duration has been going on for  2 weeks and not improving, will treat with a 7-day course of doxycycline  as she does not tolerate Augmentin  very well.  Continue to push fluids, practice good hand hygiene, cover mouth when coughing. F/u prn. If starting to experience fevers, shaking, or shortness of breath, seek immediate care. Pt voiced understanding and agreement to the plan.  Mabel Mt Olin, DO 01/03/2024 3:24 PM

## 2024-01-08 ENCOUNTER — Encounter: Payer: Self-pay | Admitting: Family Medicine

## 2024-01-09 ENCOUNTER — Other Ambulatory Visit: Payer: Self-pay | Admitting: Family Medicine

## 2024-01-09 DIAGNOSIS — J0140 Acute pansinusitis, unspecified: Secondary | ICD-10-CM

## 2024-01-09 MED ORDER — DOXYCYCLINE HYCLATE 100 MG PO TABS: 100.0000 mg | ORAL_TABLET | Freq: Two times a day (BID) | ORAL | 0 refills | Status: AC

## 2024-01-31 ENCOUNTER — Encounter: Payer: Self-pay | Admitting: Student

## 2024-01-31 ENCOUNTER — Telehealth: Admitting: Student

## 2024-01-31 ENCOUNTER — Ambulatory Visit: Payer: Self-pay

## 2024-01-31 DIAGNOSIS — J329 Chronic sinusitis, unspecified: Secondary | ICD-10-CM | POA: Diagnosis not present

## 2024-01-31 MED ORDER — DOXYCYCLINE HYCLATE 100 MG PO TABS: 100.0000 mg | ORAL_TABLET | Freq: Two times a day (BID) | ORAL | 0 refills | Status: DC

## 2024-01-31 MED ORDER — FLUTICASONE PROPIONATE 50 MCG/ACT NA SUSP: 2.0000 | Freq: Every day | NASAL | 6 refills | Status: AC

## 2024-01-31 NOTE — Telephone Encounter (Signed)
 FYI Only or Action Required?: FYI only for provider: appointment scheduled on 01/31/24 with alternate provider.  Patient was last seen in primary care on 01/03/2024 by Frann Mabel Mt, DO.  Called Nurse Triage reporting Sinusitis and Cough.  Symptoms began a week ago.  Interventions attempted: OTC medications: Mucinex .  Symptoms are: stable.  Triage Disposition: See PCP When Office is Open (Within 3 Days)  Patient/caregiver understands and will follow disposition?: Yes                                  Symptoms: sinus headache (rates pain 3-4), right side sinus pain, productive cough and ear congestion Denies: difficulty breathing, chest pain, fever, facial redness/swelling Reports sinus symptoms typically occur every 2-3 months  Patient requested virtual appointment for symptoms. Same day virtual appointment scheduled.   Reason for Disposition  [1] Using nasal washes and pain medicine > 24 hours AND [2] sinus pain (around cheekbone or eye) persists  Protocols used: Sinus Pain or Congestion-A-AH  Summary: Discolored mucous   Reason for Triage: Discolored mucous. Patient is having green mucous drainage, headaches, and a cough from the drainage. Symptoms going on for a week.

## 2024-01-31 NOTE — Telephone Encounter (Signed)
 Appt scheduled

## 2024-01-31 NOTE — Progress Notes (Signed)
 "   MyChart Video Visit    Virtual Visit via Video Note    Patient location: Home. Patient and provider in visit Provider location: Office  I discussed the limitations of evaluation and management by telemedicine and the availability of in person appointments. The patient expressed understanding and agreed to proceed.  Visit Date: 01/31/2024  Today's healthcare provider: Harlene LITTIE Jolly, NP     Subjective:    Patient ID: Jennifer Sandoval, female    DOB: [birth year], 69 y.o.   MRN: [id number]  Chief Complaint  Patient presents with   Sinusitis    Patient is here because she has sinus congestion, headache on right frontal lobe, discolored discharge and a lot of drainage. Afebrile. It has been about a week, happens frequently.     HPI  PMHx- Allergic rhinitis, recurrent sinusitis  Patient reports history of chronic recurrent sinus infections with episode occurring approximately 1 month ago.Symptoms include cough, yellow nasal drainage, sinus pressure, green sputum production with symptoms worsening over the past week.  She has taken Mucinex  with minimal relief. Denies sore throat, sick contacts.    Patient denies fever, chills, SOB, CP, palpitations, dyspnea, edema, HA, vision changes, N/V/D, abdominal pain.  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   Dry eyes, bilateral 01/30/2016   Dyspepsia 04/19/2010   FRACTURE, NOSE 10/22/2007   GERD (gastroesophageal reflux disease)    History of gestational diabetes 01/30/2016   Insomnia  01/23/2012   Leiomyoma of uterus, unspecified 02/15/2010   Lumbar compression fracture (HCC)    nonsurgical   OSTEOARTHRITIS 10/24/2006   Osteoporosis    PERIMENOPAUSAL STATUS 03/16/2010   Post-menopause 03/16/2010   Qualifier: Diagnosis of  By: Domenica MD, Stacey     Preventative health care 08/31/2012   SHOULDER PAIN, BILATERAL 10/10/2007    Past Surgical History:  Procedure Laterality Date   ABDOMINAL HYSTERECTOMY  01/02/2007   partial, ovaries left in place, for painful, heavy menstrrual bleeding secondary to anemia and fibroids   APPENDECTOMY     CESAREAN SECTION     X 3   CHOLECYSTECTOMY     COLONOSCOPY     GASTRIC BYPASS     HERNIA REPAIR     KNEE SURGERY Right    2018 or 2019   NASAL SINUS SURGERY     SEPTOPLASTY     skin biopsy     L hip BCC, facial lesions were benign   TUBAL LIGATION     ventral herniorrhaphy     X 2    Family History  Problem Relation Age of Onset   Cholelithiasis Mother    Other Mother        Urinary incontince/ bladder prolapse/ h/o smoking   Cancer Mother        laryngeal carcinoma   Allergies Mother    Arthritis Mother        s/p knee and shoulder replacement   Dementia Mother        lewy body  Other Father        Renal failure   Hypertension Father    Coronary artery disease Father        s/p bypass   Aortic aneurysm Father    Hyperlipidemia Father    Heart disease Father 43       quadruple bypass   Spina bifida Brother        with stunt/ self cath   Seizures Brother        disorders   Colon cancer Maternal Grandmother    Cancer Maternal Grandmother        colon/ smoker   Cancer Maternal Grandfather        lung   Cancer Paternal Grandmother        Breast   Heart disease Paternal Grandfather    Stroke Paternal Grandfather    ADD / ADHD Daughter    ADD / ADHD Daughter    Depression Daughter        major depressive, anxiety, secondary psychosis   Rectal cancer Neg Hx    Stomach cancer Neg Hx    Esophageal cancer  Neg Hx     Social History   Socioeconomic History   Marital status: Married    Spouse name: Not on file   Number of children: Not on file   Years of education: Not on file   Highest education level: Bachelor's degree (e.g., BA, AB, BS)  Occupational History   Not on file  Tobacco Use   Smoking status: Never   Smokeless tobacco: Never  Vaping Use   Vaping status: Never Used  Substance and Sexual Activity   Alcohol use: Yes    Comment: special occasion   Drug use: No   Sexual activity: Yes    Partners: Male  Other Topics Concern   Not on file  Social History Narrative   Not on file   Social Drivers of Health   Tobacco Use: Low Risk (01/31/2024)   Patient History    Smoking Tobacco Use: Never    Smokeless Tobacco Use: Never    Passive Exposure: Not on file  Financial Resource Strain: Low Risk (05/21/2023)   Overall Financial Resource Strain (CARDIA)    Difficulty of Paying Living Expenses: Not very hard  Food Insecurity: No Food Insecurity (05/21/2023)   Hunger Vital Sign    Worried About Running Out of Food in the Last Year: Never true    Ran Out of Food in the Last Year: Never true  Transportation Needs: No Transportation Needs (05/21/2023)   PRAPARE - Administrator, Civil Service (Medical): No    Lack of Transportation (Non-Medical): No  Physical Activity: Insufficiently Active (05/21/2023)   Exercise Vital Sign    Days of Exercise per Week: 3 days    Minutes of Exercise per Session: 30 min  Stress: No Stress Concern Present (05/21/2023)   [employer] of Occupational Health - Occupational Stress Questionnaire    Feeling of Stress : Only a little  Social Connections: Socially Integrated (05/21/2023)   Social Connection and Isolation Panel    Frequency of Communication with Friends and Family: Three times a week    Frequency of Social Gatherings with Friends and Family: Once a week    Attends Religious Services: More than 4 times per year    Active  Member of [employer] or Organizations: Yes    Attends Engineer, Structural: More than 4 times per year    Marital Status: Married  Intimate Partner Violence: Not At Risk (05/21/2023)   Humiliation, Afraid, Rape, and Kick questionnaire    Fear of Current or Ex-Partner: No    Emotionally Abused: No    Physically Abused: No    Sexually Abused: No  Depression (PHQ2-9): Low Risk (10/18/2023)   Depression (PHQ2-9)    PHQ-2 Score: 0  Alcohol Screen: Low Risk (05/21/2023)   Alcohol Screen    Last Alcohol Screening Score (AUDIT): 1  Housing: Low Risk (05/21/2023)   Housing Stability Vital Sign    Unable to Pay for Housing in the Last Year: No    Number of Times Moved in the Last Year: 0    Homeless in the Last Year: No  Utilities: Not At Risk (05/21/2023)   AHC Utilities    Threatened with loss of utilities: No  Health Literacy: Adequate Health Literacy (05/21/2023)   B1300 Health Literacy    Frequency of need for help with medical instructions: Never    Outpatient Medications Prior to Visit  Medication Sig Dispense Refill   meclizine  (ANTIVERT ) 25 MG tablet Take 1 tablet (25 mg total) by mouth 3 (three) times daily as needed for dizziness. 30 tablet 1   Multiple Vitamin (MULTIVITAMIN WITH MINERALS) TABS tablet Take 1 tablet by mouth daily. 100 tablet 0   neomycin-polymyxin b -dexamethasone (MAXITROL) 3.5-10000-0.1 OINT SMARTSIG:In Eye(s) Every Evening     pantoprazole  (PROTONIX ) 40 MG tablet TAKE 1 TABLET (40 MG TOTAL) BY MOUTH IN THE MORNING. TAKE BEFORE BREAKFAST. 90 tablet 1   sertraline  (ZOLOFT ) 100 MG tablet TAKE 2 TABLETS BY MOUTH EVERY DAY 180 tablet 1   No facility-administered medications prior to visit.    Allergies[1]  ROS See HPI    Objective:    Physical Exam  General: Patient appears well-nourished and in no acute distress. Alert and oriented to person and place. Engaged with provider throughout visit  HEENT: Head atraumatic and normocephalic. No facial asymmetry.  Pupils appear equal and reactive.  Respiratory: Breathing unlabored. No cough, wheezing, or respiratory distress observed. Respiratory rate appears normal. Neurological: Patient alert and oriented. Speech coherent. No facial droop, slurred speech, or focal deficits observed. Psychiatric: Mood and affect assessed via video. Patient cooperative. Thought process logical/coherent  There were no vitals taken for this visit. Wt Readings from Last 3 Encounters:  10/18/23 128 lb (58.1 kg)  07/19/23 129 lb (58.5 kg)  05/21/23 129 lb (58.5 kg)       Assessment & Plan:   Problem List Items Addressed This Visit       Respiratory   Recurrent sinusitis - Primary   Rx- Doxycycline  -Advise Flonase  daily, continue Mucinex  and ensure adequate hydration. Discussed taking medications as prescribed.  Push fluids and rest RTC if symptoms worsen or persist despite treatment x 7-10 days, sooner if needed.  Pt voiced understanding and agreement to the plan.           I am having Jennifer Sandoval maintain her multivitamin with minerals, pantoprazole , meclizine , sertraline , and neomycin-polymyxin b -dexamethasone.  No orders of the defined types were placed in this encounter.  I discussed the assessment and treatment plan with the patient. The patient was provided an opportunity to ask questions and all were answered. The patient agreed with the plan and demonstrated an understanding of the instructions.   The patient was advised to call back or seek an in-person evaluation if the symptoms worsen or if the condition fails to improve as anticipated.  Initially connected via video visit; however, audio  was not functional due to technical difficulties. Patient agreed to continue the visit by telephone, remainder of encounter was completed by phone. Total encounter time: 7 minutes   Harlene LITTIE Jolly, NP [facility] [phone] (phone) [phone]  (fax)  [facility]      [1]  Allergies Allergen Reactions   Itraconazole     REACTION: Rash   Levaquin  [Levofloxacin  In D5w]     Joint pain   "

## 2024-01-31 NOTE — Assessment & Plan Note (Signed)
 Rx- Doxycycline  -Advise Flonase  daily, continue Mucinex  and ensure adequate hydration. Discussed taking medications as prescribed.  Push fluids and rest RTC if symptoms worsen or persist despite treatment x 7-10 days, sooner if needed.  Pt voiced understanding and agreement to the plan.

## 2024-02-06 ENCOUNTER — Telehealth: Payer: Self-pay

## 2024-02-06 ENCOUNTER — Other Ambulatory Visit: Payer: Self-pay | Admitting: Family Medicine

## 2024-02-06 DIAGNOSIS — J329 Chronic sinusitis, unspecified: Secondary | ICD-10-CM

## 2024-02-06 MED ORDER — DOXYCYCLINE HYCLATE 100 MG PO TABS: 100.0000 mg | ORAL_TABLET | Freq: Two times a day (BID) | ORAL | 0 refills | Status: AC

## 2024-02-06 NOTE — Telephone Encounter (Addendum)
Called pt and no answer . left detailed message .

## 2024-02-06 NOTE — Telephone Encounter (Signed)
 Copied from CRM #[id number]. Topic: Clinical - Medication Question >> Feb 06, 2024  9:51 AM [name] DEL wrote: Reason for CRM: Patient was prescribed doxycycline  (VIBRA -TABS) 100 MG tablet for an sinus infection. She stated that she informed provider that she needed 10 to 14 day supply for he symptoms in order for medication to work for her. However she was prescribed 7 days and her symptoms are still there. Patient would like to know if she could have another 7-14 days prescribed?    [id number] - [place], [place] - [address] [place] [place]  Phone: [phone] Fax: [phone]

## 2024-05-26 ENCOUNTER — Ambulatory Visit

## 2024-05-27 ENCOUNTER — Ambulatory Visit
